# Patient Record
Sex: Female | Born: 1960
Health system: Southern US, Community
[De-identification: ages and names within clinical notes are randomized; demographics above are authoritative.]

## PROBLEM LIST (undated history)

## (undated) DIAGNOSIS — G5600 Carpal tunnel syndrome, unspecified upper limb: Secondary | ICD-10-CM

## (undated) DIAGNOSIS — E669 Obesity, unspecified: Secondary | ICD-10-CM

## (undated) DIAGNOSIS — M1990 Unspecified osteoarthritis, unspecified site: Secondary | ICD-10-CM

## (undated) DIAGNOSIS — K219 Gastro-esophageal reflux disease without esophagitis: Secondary | ICD-10-CM

## (undated) DIAGNOSIS — T7840XA Allergy, unspecified, initial encounter: Secondary | ICD-10-CM

## (undated) DIAGNOSIS — I10 Essential (primary) hypertension: Secondary | ICD-10-CM

## (undated) DIAGNOSIS — E559 Vitamin D deficiency, unspecified: Secondary | ICD-10-CM

## (undated) DIAGNOSIS — E538 Deficiency of other specified B group vitamins: Secondary | ICD-10-CM

## (undated) DIAGNOSIS — R609 Edema, unspecified: Secondary | ICD-10-CM

## (undated) DIAGNOSIS — J329 Chronic sinusitis, unspecified: Secondary | ICD-10-CM

## (undated) DIAGNOSIS — D649 Anemia, unspecified: Secondary | ICD-10-CM

## (undated) HISTORY — PX: CARPAL TUNNEL RELEASE: SHX101

## (undated) HISTORY — DX: Allergy, unspecified, initial encounter: T78.40XA

## (undated) HISTORY — DX: Deficiency of other specified B group vitamins: E53.8

## (undated) HISTORY — DX: Essential (primary) hypertension: I10

## (undated) HISTORY — PX: HERNIA REPAIR: SHX51

## (undated) HISTORY — DX: Chronic sinusitis, unspecified: J32.9

## (undated) HISTORY — PX: NASAL SINUS SURGERY: SHX719

## (undated) HISTORY — DX: Gastro-esophageal reflux disease without esophagitis: K21.9

## (undated) HISTORY — DX: Anemia, unspecified: D64.9

## (undated) HISTORY — DX: Vitamin D deficiency, unspecified: E55.9

## (undated) HISTORY — DX: Obesity, unspecified: E66.9

## (undated) HISTORY — DX: Carpal tunnel syndrome, unspecified upper limb: G56.00

## (undated) HISTORY — PX: JOINT REPLACEMENT: SHX530

## (undated) HISTORY — DX: Edema, unspecified: R60.9

## (undated) HISTORY — PX: COLONOSCOPY: SHX174

## (undated) HISTORY — PX: HEMORRHOID SURGERY: SHX153

---

## 1999-01-30 ENCOUNTER — Emergency Department (HOSPITAL_COMMUNITY): Admission: EM | Admit: 1999-01-30 | Discharge: 1999-01-30 | Payer: Self-pay | Admitting: Emergency Medicine

## 2000-05-12 ENCOUNTER — Other Ambulatory Visit: Admission: RE | Admit: 2000-05-12 | Discharge: 2000-05-12 | Payer: Self-pay | Admitting: Internal Medicine

## 2001-04-14 ENCOUNTER — Encounter (INDEPENDENT_AMBULATORY_CARE_PROVIDER_SITE_OTHER): Payer: Self-pay | Admitting: *Deleted

## 2001-04-14 ENCOUNTER — Ambulatory Visit (HOSPITAL_COMMUNITY): Admission: RE | Admit: 2001-04-14 | Discharge: 2001-04-14 | Payer: Self-pay | Admitting: General Surgery

## 2001-05-10 ENCOUNTER — Ambulatory Visit (HOSPITAL_COMMUNITY): Admission: RE | Admit: 2001-05-10 | Discharge: 2001-05-10 | Payer: Self-pay | Admitting: General Surgery

## 2001-06-08 ENCOUNTER — Other Ambulatory Visit: Admission: RE | Admit: 2001-06-08 | Discharge: 2001-06-08 | Payer: Self-pay | Admitting: Internal Medicine

## 2002-10-02 ENCOUNTER — Other Ambulatory Visit: Admission: RE | Admit: 2002-10-02 | Discharge: 2002-10-02 | Payer: Self-pay | Admitting: Internal Medicine

## 2003-05-23 ENCOUNTER — Other Ambulatory Visit: Admission: RE | Admit: 2003-05-23 | Discharge: 2003-05-23 | Payer: Self-pay | Admitting: Obstetrics and Gynecology

## 2004-01-18 ENCOUNTER — Other Ambulatory Visit: Admission: RE | Admit: 2004-01-18 | Discharge: 2004-01-18 | Payer: Self-pay | Admitting: Obstetrics and Gynecology

## 2004-07-21 ENCOUNTER — Other Ambulatory Visit: Admission: RE | Admit: 2004-07-21 | Discharge: 2004-07-21 | Payer: Self-pay | Admitting: Obstetrics and Gynecology

## 2004-08-27 ENCOUNTER — Ambulatory Visit: Payer: Self-pay | Admitting: Internal Medicine

## 2004-12-29 ENCOUNTER — Ambulatory Visit: Payer: Self-pay | Admitting: Internal Medicine

## 2005-01-14 ENCOUNTER — Other Ambulatory Visit: Admission: RE | Admit: 2005-01-14 | Discharge: 2005-01-14 | Payer: Self-pay | Admitting: Obstetrics and Gynecology

## 2005-03-20 ENCOUNTER — Ambulatory Visit: Payer: Self-pay | Admitting: Family Medicine

## 2005-06-23 ENCOUNTER — Ambulatory Visit: Payer: Self-pay | Admitting: Internal Medicine

## 2005-07-15 ENCOUNTER — Ambulatory Visit: Payer: Self-pay | Admitting: Family Medicine

## 2005-07-15 ENCOUNTER — Emergency Department (HOSPITAL_COMMUNITY): Admission: EM | Admit: 2005-07-15 | Discharge: 2005-07-15 | Payer: Self-pay | Admitting: Emergency Medicine

## 2005-07-16 ENCOUNTER — Ambulatory Visit: Payer: Self-pay | Admitting: Internal Medicine

## 2005-07-16 ENCOUNTER — Ambulatory Visit (HOSPITAL_COMMUNITY): Admission: EM | Admit: 2005-07-16 | Discharge: 2005-07-17 | Payer: Self-pay | Admitting: Emergency Medicine

## 2005-08-04 ENCOUNTER — Ambulatory Visit: Payer: Self-pay | Admitting: Internal Medicine

## 2006-05-12 ENCOUNTER — Ambulatory Visit: Payer: Self-pay | Admitting: Internal Medicine

## 2006-06-11 ENCOUNTER — Ambulatory Visit: Payer: Self-pay | Admitting: Internal Medicine

## 2007-02-10 ENCOUNTER — Encounter: Payer: Self-pay | Admitting: Internal Medicine

## 2007-02-10 DIAGNOSIS — E6609 Other obesity due to excess calories: Secondary | ICD-10-CM | POA: Insufficient documentation

## 2007-02-10 DIAGNOSIS — I10 Essential (primary) hypertension: Secondary | ICD-10-CM | POA: Insufficient documentation

## 2007-02-10 DIAGNOSIS — Z6838 Body mass index (BMI) 38.0-38.9, adult: Secondary | ICD-10-CM

## 2007-02-10 DIAGNOSIS — E669 Obesity, unspecified: Secondary | ICD-10-CM

## 2007-02-10 HISTORY — DX: Essential (primary) hypertension: I10

## 2007-02-10 HISTORY — DX: Obesity, unspecified: E66.9

## 2007-05-17 ENCOUNTER — Encounter (INDEPENDENT_AMBULATORY_CARE_PROVIDER_SITE_OTHER): Payer: Self-pay

## 2007-06-10 ENCOUNTER — Encounter: Payer: Self-pay | Admitting: Internal Medicine

## 2007-06-23 ENCOUNTER — Telehealth: Payer: Self-pay | Admitting: Internal Medicine

## 2007-09-08 ENCOUNTER — Telehealth: Payer: Self-pay | Admitting: Internal Medicine

## 2007-09-14 ENCOUNTER — Ambulatory Visit: Payer: Self-pay | Admitting: Internal Medicine

## 2007-09-14 DIAGNOSIS — M25562 Pain in left knee: Secondary | ICD-10-CM

## 2007-09-14 DIAGNOSIS — G8929 Other chronic pain: Secondary | ICD-10-CM | POA: Insufficient documentation

## 2007-10-07 ENCOUNTER — Telehealth: Payer: Self-pay | Admitting: Internal Medicine

## 2007-10-11 ENCOUNTER — Ambulatory Visit: Payer: Self-pay | Admitting: Internal Medicine

## 2007-10-11 DIAGNOSIS — J019 Acute sinusitis, unspecified: Secondary | ICD-10-CM | POA: Insufficient documentation

## 2007-12-05 ENCOUNTER — Telehealth: Payer: Self-pay | Admitting: Internal Medicine

## 2008-03-06 ENCOUNTER — Ambulatory Visit: Payer: Self-pay | Admitting: Internal Medicine

## 2008-03-06 DIAGNOSIS — K219 Gastro-esophageal reflux disease without esophagitis: Secondary | ICD-10-CM

## 2008-03-06 HISTORY — DX: Gastro-esophageal reflux disease without esophagitis: K21.9

## 2008-03-06 LAB — CONVERTED CEMR LAB
ALT: 17 units/L (ref 0–35)
AST: 18 units/L (ref 0–37)
Albumin: 3.6 g/dL (ref 3.5–5.2)
Alkaline Phosphatase: 84 units/L (ref 39–117)
BUN: 11 mg/dL (ref 6–23)
Basophils Absolute: 0.1 10*3/uL (ref 0.0–0.1)
Basophils Relative: 1.8 % — ABNORMAL HIGH (ref 0.0–1.0)
Bilirubin, Direct: 0.1 mg/dL (ref 0.0–0.3)
CO2: 29 meq/L (ref 19–32)
Calcium: 8.9 mg/dL (ref 8.4–10.5)
Chloride: 103 meq/L (ref 96–112)
Cholesterol: 152 mg/dL (ref 0–200)
Creatinine, Ser: 0.7 mg/dL (ref 0.4–1.2)
Eosinophils Absolute: 0.1 10*3/uL (ref 0.0–0.7)
Eosinophils Relative: 2.1 % (ref 0.0–5.0)
GFR calc Af Amer: 115 mL/min
GFR calc non Af Amer: 95 mL/min
Glucose, Bld: 66 mg/dL — ABNORMAL LOW (ref 70–99)
HCT: 37.1 % (ref 36.0–46.0)
Hemoglobin: 12.3 g/dL (ref 12.0–15.0)
Lymphocytes Relative: 34.1 % (ref 12.0–46.0)
MCHC: 33.2 g/dL (ref 30.0–36.0)
MCV: 93.8 fL (ref 78.0–100.0)
Monocytes Absolute: 0.6 10*3/uL (ref 0.1–1.0)
Monocytes Relative: 8.5 % (ref 3.0–12.0)
Neutro Abs: 3.5 10*3/uL (ref 1.4–7.7)
Neutrophils Relative %: 53.5 % (ref 43.0–77.0)
Platelets: 392 10*3/uL (ref 150–400)
Potassium: 4.4 meq/L (ref 3.5–5.1)
RBC: 3.95 M/uL (ref 3.87–5.11)
RDW: 13 % (ref 11.5–14.6)
Sodium: 140 meq/L (ref 135–145)
TSH: 0.73 microintl units/mL (ref 0.35–5.50)
Total Bilirubin: 0.6 mg/dL (ref 0.3–1.2)
Total Protein: 6.9 g/dL (ref 6.0–8.3)
WBC: 6.5 10*3/uL (ref 4.5–10.5)

## 2008-04-12 ENCOUNTER — Telehealth (INDEPENDENT_AMBULATORY_CARE_PROVIDER_SITE_OTHER): Payer: Self-pay | Admitting: *Deleted

## 2008-05-09 ENCOUNTER — Telehealth: Payer: Self-pay | Admitting: *Deleted

## 2008-06-05 ENCOUNTER — Encounter: Payer: Self-pay | Admitting: Internal Medicine

## 2008-06-11 ENCOUNTER — Encounter: Admission: RE | Admit: 2008-06-11 | Discharge: 2008-06-11 | Payer: Self-pay | Admitting: General Surgery

## 2008-06-13 ENCOUNTER — Ambulatory Visit (HOSPITAL_BASED_OUTPATIENT_CLINIC_OR_DEPARTMENT_OTHER): Admission: RE | Admit: 2008-06-13 | Discharge: 2008-06-14 | Payer: Self-pay | Admitting: General Surgery

## 2008-06-13 ENCOUNTER — Encounter (HOSPITAL_BASED_OUTPATIENT_CLINIC_OR_DEPARTMENT_OTHER): Payer: Self-pay | Admitting: General Surgery

## 2008-10-16 ENCOUNTER — Ambulatory Visit: Payer: Self-pay | Admitting: Internal Medicine

## 2008-10-16 DIAGNOSIS — J069 Acute upper respiratory infection, unspecified: Secondary | ICD-10-CM | POA: Insufficient documentation

## 2009-01-02 ENCOUNTER — Telehealth: Payer: Self-pay | Admitting: Family Medicine

## 2009-01-09 ENCOUNTER — Encounter (INDEPENDENT_AMBULATORY_CARE_PROVIDER_SITE_OTHER): Payer: Self-pay | Admitting: *Deleted

## 2009-01-09 ENCOUNTER — Ambulatory Visit: Payer: Self-pay | Admitting: Internal Medicine

## 2009-01-09 DIAGNOSIS — M542 Cervicalgia: Secondary | ICD-10-CM | POA: Insufficient documentation

## 2009-01-11 ENCOUNTER — Telehealth: Payer: Self-pay | Admitting: Internal Medicine

## 2009-05-02 ENCOUNTER — Ambulatory Visit: Payer: Self-pay | Admitting: Internal Medicine

## 2009-06-14 ENCOUNTER — Telehealth: Payer: Self-pay | Admitting: Internal Medicine

## 2010-04-04 ENCOUNTER — Ambulatory Visit: Payer: Self-pay | Admitting: Family Medicine

## 2010-04-04 DIAGNOSIS — J309 Allergic rhinitis, unspecified: Secondary | ICD-10-CM | POA: Insufficient documentation

## 2010-08-04 ENCOUNTER — Ambulatory Visit: Payer: Self-pay | Admitting: Internal Medicine

## 2010-08-04 DIAGNOSIS — G5600 Carpal tunnel syndrome, unspecified upper limb: Secondary | ICD-10-CM | POA: Insufficient documentation

## 2010-08-04 HISTORY — DX: Carpal tunnel syndrome, unspecified upper limb: G56.00

## 2010-08-11 ENCOUNTER — Telehealth: Payer: Self-pay | Admitting: Internal Medicine

## 2010-10-28 NOTE — Progress Notes (Signed)
Summary: Cough Medicine Needed  Phone Note Call from Patient Call back at [phone]   Caller: Patient Reason for Call: Acute Illness Summary of Call: Patient called an LM on triage VM stating that she has a sinus infection and a horrible cough. She  says that she is taking delsym and it is not working. She would like something stronger called in. Please advise.  Ph[facility]: [facility] [address]  Initial call taken by: Harold Barban,  August 11, 2010 1:23 PM  Follow-up for Phone Call        generic Hydromet, 6 ounces, 1 teaspoon every 6 hours as needed for cough refill one Follow-up by: Gordy Savers  MD,  August 11, 2010 5:19 PM  Additional Follow-up for Phone Call Additional follow up Details #1::        med list changed - [facility] - pt aware. KIK Additional Follow-up by: Duard Brady LPN,  August 13, 2010 8:46 AM    New/Updated Medications: HYDROMET 5-1.5 MG/5ML SYRP (HYDROCODONE-HOMATROPINE) 1 tsp q6hrs as needed cough Prescriptions: HYDROMET 5-1.5 MG/5ML SYRP (HYDROCODONE-HOMATROPINE) 1 tsp q6hrs as needed cough  #6oz x 0   Entered by:   Duard Brady LPN   Authorized by:   Gordy Savers  MD   Signed by:   Duard Brady LPN on 16/06/9603   Method used:   Historical   RxID:   [id number]

## 2010-10-28 NOTE — Assessment & Plan Note (Signed)
Summary: BILATERAL HAND NUMBNESS/TINGLING // RS   Vital Signs:  Patient profile:   50 year old female Weight:      246 pounds Temp:     98.4 degrees F oral BP sitting:   110 / 70  (right arm) Cuff size:   regular  Vitals Entered By: Duard Brady LPN (August 04, 2010 4:32 PM) CC: c/o numbness to both hands on/off x3 mos , (R) wrist pain Is Patient Diabetic? No   CC:  c/o numbness to both hands on/off x3 mos  and (R) wrist pain.  History of Present Illness: 50 year old patient who has a long history of hand and arm numbness.  For the past several weeks.  She has had more difficulty throughout the day.  In the past as a problem, just when she awoke in the morning, but then quickly improved.  Denies any motor weakness; she also has had some pain involving the right wrist area.  She has treated hypertension, which has been under good control  Allergies: 1)  Sulfamethoxazole (Sulfamethoxazole)  Past History:  Past Medical History: Reviewed history from 10/16/2008 and no changes required. Hypertension GERD obesity  Review of Systems       The patient complains of weight gain.  The patient denies anorexia, fever, weight loss, vision loss, decreased hearing, hoarseness, chest pain, syncope, dyspnea on exertion, peripheral edema, prolonged cough, headaches, hemoptysis, abdominal pain, melena, hematochezia, severe indigestion/heartburn, hematuria, incontinence, genital sores, muscle weakness, suspicious skin lesions, transient blindness, difficulty walking, depression, unusual weight change, abnormal bleeding, enlarged lymph nodes, angioedema, and breast masses.    Physical Exam  General:  overweight-appearing.  normal blood pressure Msk:  positive Tinel's.  No motor weakness   Impression & Recommendations:  Problem # 1:  CARPAL TUNNEL SYNDROME, BILATERAL (ICD-354.0)  Problem # 2:  HYPERTENSION (ICD-401.9)  Her updated medication list for this problem includes:  Benazepril-hydrochlorothiazide 20-25 Mg Tabs (Benazepril-hydrochlorothiazide) .Marland Kitchen... Take 1 tablet by mouth once a day  Complete Medication List: 1)  Benazepril-hydrochlorothiazide 20-25 Mg Tabs (Benazepril-hydrochlorothiazide) .... Take 1 tablet by mouth once a day 2)  Diethylpropion Hcl Cr 75 Mg Tb24 (Diethylpropion hcl) .Marland Kitchen.. 1 once daily 3)  Naproxen 500 Mg Tabs (Naproxen) .... One twice daily 4)  Zyrtec Hives Relief 10 Mg Tabs (Cetirizine hcl) .... One a day 5)  Mucinex Dm 30-600 Mg Xr12h-tab (Dextromethorphan-guaifenesin) .... Once a day  Patient Instructions: 1)  Please schedule a follow-up appointment in 6 months. 2)  Limit your Sodium (Salt). 3)  It is important that you exercise regularly at least 20 minutes 5 times a week. If you develop chest pain, have severe difficulty breathing, or feel very tired , stop exercising immediately and seek medical attention. 4)  You need to lose weight. Consider a lower calorie diet and regular exercise.  5)  Check your Blood Pressure regularly. If it is above:  150/90 you should make an appointment. Prescriptions: NAPROXEN 500 MG TABS (NAPROXEN) one twice daily  #180 x 4   Entered and Authorized by:   Gordy Savers  MD   Signed by:   Gordy Savers  MD on 08/04/2010   Method used:   Print then Give to Patient   RxID:   [id number] DIETHYLPROPION HCL CR 75 MG  TB24 (DIETHYLPROPION HCL) 1 once daily  #90 x 4   Entered and Authorized by:   Gordy Savers  MD   Signed by:   Gordy Savers  MD on 08/04/2010  Method used:   Print then Give to Patient   RxID:   [id number] BENAZEPRIL-HYDROCHLOROTHIAZIDE 20-25 MG TABS (BENAZEPRIL-HYDROCHLOROTHIAZIDE) Take 1 tablet by mouth once a day  #90 Tablet x 4   Entered and Authorized by:   Gordy Savers  MD   Signed by:   Gordy Savers  MD on 08/04/2010   Method used:   Print then Give to Patient   RxID:   [id number]   Orders Added: 1)  Est. Patient Level  III [95284]  Appended Document: BILATERAL HAND NUMBNESS/TINGLING // RS     Allergies: 1)  Sulfamethoxazole (Sulfamethoxazole)   Complete Medication List: 1)  Benazepril-hydrochlorothiazide 20-25 Mg Tabs (Benazepril-hydrochlorothiazide) .... Take 1 tablet by mouth once a day 2)  Diethylpropion Hcl Cr 75 Mg Tb24 (Diethylpropion hcl) .Marland Kitchen.. 1 once daily 3)  Naproxen 500 Mg Tabs (Naproxen) .... One twice daily 4)  Zyrtec Hives Relief 10 Mg Tabs (Cetirizine hcl) .... One a day 5)  Mucinex Dm 30-600 Mg Xr12h-tab (Dextromethorphan-guaifenesin) .... Once a day  Other Orders: Admin 1st Vaccine (13244) Flu Vaccine 67yrs + [id number])   Orders Added: 1)  Admin 1st Vaccine [90471] 2)  Flu Vaccine 58yrs + [25366] Flu Vaccine Consent Questions     Do you have a history of severe allergic reactions to this vaccine? no    Any prior history of allergic reactions to egg and/or gelatin? no    Do you have a sensitivity to the preservative Thimersol? no    Do you have a past history of Guillan-Barre Syndrome? no    Do you currently have an acute febrile illness? no    Have you ever had a severe reaction to latex? no    Vaccine information given and explained to patient? yes    Are you currently pregnant? no    Lot Number:[id number]   Exp Date:03/28/2011   Site Given  Left Deltoid IM 1)  Admin 1st Vaccine [90471] 2)  Flu Vaccine 53yrs + [44034]    .[email or web address]

## 2010-10-28 NOTE — Assessment & Plan Note (Signed)
Summary: sore throat/dm   Vital Signs:  Patient profile:   50 year old female Weight:      245 pounds Temp:     98.0 degrees F oral BP sitting:   126 / 90  (right arm) Cuff size:   regular  Vitals Entered By: Kathrynn Speed CMA (April 04, 2010 10:03 AM) CC: sore throat x 3 days, nasal drainage Cyndia Skeeters   CC:  sore throat x 3 days and nasal drainage /src.  History of Present Illness: Hailey [name] is a 50 year old female, nonsmoker, who comes in with a 3-day history of a sore throat, head congestion, soreness in her left ear and postnasal drip.  She, states she's been under a lot of stress recently.  Her father just died at the [place].  she thinks she has a sinus infection and is requesting an antibiotic.  No fever, facial pain, etc., etc., etc., and she is taking Zyrtec-D, however, she has underlying hypertension.  BP today 126/90.  I recommended because of her hypertension that she take the plain Zyrtec, not to be she states the Zyrtec is not helping at all and wants another medication  Current Medications (verified): 1)  Benazepril-Hydrochlorothiazide 20-25 Mg Tabs (Benazepril-Hydrochlorothiazide) .... Take 1 Tablet By Mouth Once A Day 2)  Diethylpropion Hcl Cr 75 Mg  Tb24 (Diethylpropion Hcl) .Marland Kitchen.. 1 Once Daily 3)  Zegerid 40-1100 Mg  Caps (Omeprazole-Sodium Bicarbonate) .... One Daily 4)  Naproxen 500 Mg Tabs (Naproxen) .... One Twice Daily 5)  Zyrtec Hives Relief 10 Mg Tabs (Cetirizine Hcl) .... One A Day 6)  Mucinex Dm 30-600 Mg Xr12h-Tab (Dextromethorphan-Guaifenesin) .... Once A Day  Allergies (verified): 1)  Sulfamethoxazole (Sulfamethoxazole)  Past History:  Past medical, surgical, family and social histories (including risk factors) reviewed for relevance to current acute and chronic problems.  Past Medical History: Reviewed history from 10/16/2008 and no changes required. Hypertension GERD obesity  Past Surgical History: Reviewed history from 02/10/2007 and no changes  required. Hemorrhoidectomy Sinus surgery  Family History: Reviewed history from 09/14/2007 and no changes required. mother recently died this past fall due to autoimmune hepatitis and liver failure  Social History: Reviewed history and no changes required.  Review of Systems      See HPI  Physical Exam  General:  Well-developed,well-nourished,in no acute distress; alert,appropriate and cooperative throughout examination Head:  Normocephalic and atraumatic without obvious abnormalities. No apparent alopecia or balding. Eyes:  No corneal or conjunctival inflammation noted. EOMI. Perrla. Funduscopic exam benign, without hemorrhages, exudates or papilledema. Vision grossly normal. Ears:  External ear exam shows no significant lesions or deformities.  Otoscopic examination reveals clear canals, tympanic membranes are intact bilaterally without bulging, retraction, inflammation or discharge. Hearing is grossly normal bilaterally. Nose:  External nasal examination shows no deformity or inflammation. Nasal mucosa are pink and moist without lesions or exudates. Mouth:  oral cavity normal.  A lot of postnasal drip.  She also requests I look where she had a mole removed on the left lower jaw.  That area also appears normal.  However, I explained to her, that I'm not the dentist, and she is having trouble that she needs to go back and see the oral surgeon Neck:  No deformities, masses, or tenderness noted. Chest Wall:  No deformities, masses, or tenderness noted. Lungs:  Normal respiratory effort, chest expands symmetrically. Lungs are clear to auscultation, no crackles or wheezes.   Problems:  Medical Problems Added: 1)  Dx of Rhinitis  (ICD-477.9)  Impression &  Recommendations:  Problem # 1:  RHINITIS (ICD-477.9) Assessment Deteriorated  Her updated medication list for this problem includes:    Zyrtec Hives Relief 10 Mg Tabs (Cetirizine hcl) ..... One a day  Complete Medication  List: 1)  Benazepril-hydrochlorothiazide 20-25 Mg Tabs (Benazepril-hydrochlorothiazide) .... Take 1 tablet by mouth once a day 2)  Diethylpropion Hcl Cr 75 Mg Tb24 (Diethylpropion hcl) .Marland Kitchen.. 1 once daily 3)  Zegerid 40-1100 Mg Caps (Omeprazole-sodium bicarbonate) .... One daily 4)  Naproxen 500 Mg Tabs (Naproxen) .... One twice daily 5)  Zyrtec Hives Relief 10 Mg Tabs (Cetirizine hcl) .... One a day 6)  Mucinex Dm 30-600 Mg Xr12h-tab (Dextromethorphan-guaifenesin) .... Once a day  Patient Instructions: 1)  I recommend that you take plain Claritin in the morning and in the future.  Did not take anything with pseudoephedrine in it because of the underlying hypertension.  Return p.r.n.Marland Kitchen 2)  I explained that this is a allergic response.  No evidence of infection.  Therefore, antibiotics are not indicated

## 2010-12-22 ENCOUNTER — Ambulatory Visit (INDEPENDENT_AMBULATORY_CARE_PROVIDER_SITE_OTHER): Payer: PRIVATE HEALTH INSURANCE | Admitting: Internal Medicine

## 2010-12-22 ENCOUNTER — Encounter: Payer: Self-pay | Admitting: Internal Medicine

## 2010-12-22 VITALS — BP 118/70 | Temp 98.5°F | Ht 65.0 in | Wt 250.0 lb

## 2010-12-22 DIAGNOSIS — M25519 Pain in unspecified shoulder: Secondary | ICD-10-CM

## 2010-12-22 DIAGNOSIS — M25512 Pain in left shoulder: Secondary | ICD-10-CM

## 2010-12-22 DIAGNOSIS — I10 Essential (primary) hypertension: Secondary | ICD-10-CM

## 2010-12-22 MED ORDER — ZOLPIDEM TARTRATE 10 MG PO TABS: 10.0000 mg | ORAL_TABLET | Freq: Every evening | ORAL | Status: AC | PRN

## 2010-12-22 NOTE — Progress Notes (Signed)
  Subjective:    Patient ID: Hailey [name], female    DOB: 10-30-1960, 50 y.o.   MRN: [id number]  HPI   50 year old left-handed female who presents with left shoulder pain as well as left anterior chest wall pain. She is quite active physically and was doing considerable overhead work stocking food products. She's had pain in left shoulder area with movement as well as tenderness in the anterior mid chest region. She is sleeping poorly due to the poor health of her husband denies any exertional chest pain she does have treated hypertension which has been stable. She does have exogenous obesity she is on naproxen 500 mg once daily    Review of Systems  Constitutional: Negative.   HENT: Negative for hearing loss, congestion, sore throat, rhinorrhea, dental problem, sinus pressure and tinnitus.   Eyes: Negative for pain, discharge and visual disturbance.  Respiratory: Negative for cough and shortness of breath.   Cardiovascular: Positive for chest pain. Negative for palpitations and leg swelling.  Gastrointestinal: Negative for nausea, vomiting, abdominal pain, diarrhea, constipation, blood in stool and abdominal distention.  Genitourinary: Negative for dysuria, urgency, frequency, hematuria, flank pain, vaginal bleeding, vaginal discharge, difficulty urinating, vaginal pain and pelvic pain.  Musculoskeletal: Negative for joint swelling, arthralgias and gait problem.       [ Left shoulder pain with movement Skin: Negative for rash.  Neurological: Negative for dizziness, syncope, speech difficulty, weakness, numbness and headaches.  Hematological: Negative for adenopathy.  Psychiatric/Behavioral: Negative for behavioral problems, dysphoric mood and agitation. The patient is not nervous/anxious.        Objective:   Physical Exam  Constitutional: She is oriented to person, place, and time. She appears well-developed and well-nourished.  HENT:  Head: Normocephalic.  Right Ear: External ear  normal.  Left Ear: External ear normal.  Mouth/Throat: Oropharynx is clear and moist.  Eyes: Conjunctivae and EOM are normal. Pupils are equal, round, and reactive to light.  Neck: Normal range of motion. Neck supple. No thyromegaly present.  Cardiovascular: Normal rate, regular rhythm, normal heart sounds and intact distal pulses.   Pulmonary/Chest: Effort normal and breath sounds normal.        Tenderness was noted to gentle palpation over the mid anterior chest wall  Abdominal: Soft. Bowel sounds are normal. She exhibits no mass. There is no tenderness.  Musculoskeletal: Normal range of motion.        Range of motion left shoulder intact she did have tenderness with movement however  Lymphadenopathy:    She has no cervical adenopathy.  Neurological: She is alert and oriented to person, place, and time.  Skin: Skin is warm and dry. No rash noted.  Psychiatric: She has a normal mood and affect. Her behavior is normal.          Assessment & Plan:   anterior chest wall pain. Left shoulder discomfort. Musculoskeletal. Will increase naproxen to a twice a day regimen and observe  Hypertension stable.   We'll recheck in 6 months or when necessary

## 2010-12-22 NOTE — Patient Instructions (Signed)
Increase naproxen to twice daily  Limit your sodium (Salt) intake  Please check your blood pressure on a regular basis.  If it is consistently greater than 150/90, please make an office appointment.  Return in 6 months for follow-up  Call or return to clinic prn if these symptoms worsen or fail to improve as anticipated.

## 2011-02-09 ENCOUNTER — Ambulatory Visit: Payer: PRIVATE HEALTH INSURANCE | Admitting: Internal Medicine

## 2011-02-09 ENCOUNTER — Telehealth: Payer: Self-pay | Admitting: Internal Medicine

## 2011-02-09 ENCOUNTER — Encounter: Payer: Self-pay | Admitting: Internal Medicine

## 2011-02-09 ENCOUNTER — Ambulatory Visit (INDEPENDENT_AMBULATORY_CARE_PROVIDER_SITE_OTHER): Payer: PRIVATE HEALTH INSURANCE | Admitting: Internal Medicine

## 2011-02-09 DIAGNOSIS — I10 Essential (primary) hypertension: Secondary | ICD-10-CM

## 2011-02-09 DIAGNOSIS — G5600 Carpal tunnel syndrome, unspecified upper limb: Secondary | ICD-10-CM

## 2011-02-09 DIAGNOSIS — R200 Anesthesia of skin: Secondary | ICD-10-CM

## 2011-02-09 MED ORDER — FUROSEMIDE 20 MG PO TABS: 20.0000 mg | ORAL_TABLET | Freq: Every day | ORAL | Status: DC

## 2011-02-09 NOTE — Patient Instructions (Signed)
Limit your sodium (Salt) intake  Please check your blood pressure on a regular basis.  If it is consistently greater than 150/90, please make an office appointment.  Nerve conduction  studies as discussed  Return in 4 months for follow-up

## 2011-02-09 NOTE — Progress Notes (Signed)
  Subjective:    Patient ID: Hailey [name], female    DOB: 08-02-1961, 50 y.o.   MRN: [id number]  HPI  50 year old patient who has a history of suspected bilateral carpal tunnel syndrome. She has been symptomatic for about 6 months she has been using nocturnal wrist splints anti-inflammatory medications. She is right-handed but does use both hands with repetitious type activities frequently throughout the day she describes increasing numbness involving the tips of the right thumb and index fingers. She describes tingling involving both hands there's been no motor weakness .   she has hypertension which has been well-controlled on ACE inhibition and diuretic therapy she complains of increasing pedal edema. She does request a fluid filled take as needed    Review of Systems  Constitutional: Negative.   HENT: Negative for hearing loss, congestion, sore throat, rhinorrhea, dental problem, sinus pressure and tinnitus.   Eyes: Negative for pain, discharge and visual disturbance.  Respiratory: Negative for cough and shortness of breath.   Cardiovascular: Positive for leg swelling. Negative for chest pain and palpitations.  Gastrointestinal: Negative for nausea, vomiting, abdominal pain, diarrhea, constipation, blood in stool and abdominal distention.  Genitourinary: Negative for dysuria, urgency, frequency, hematuria, flank pain, vaginal bleeding, vaginal discharge, difficulty urinating, vaginal pain and pelvic pain.  Musculoskeletal: Negative for joint swelling, arthralgias and gait problem.  Skin: Negative for rash.  Neurological: Positive for numbness. Negative for dizziness, syncope, speech difficulty, weakness and headaches.  Hematological: Negative for adenopathy.  Psychiatric/Behavioral: Negative for behavioral problems, dysphoric mood and agitation. The patient is not nervous/anxious.        Objective:   Physical Exam  Constitutional: She appears well-developed and well-nourished. No  distress.       Overweight no distress blood pressure 140/80  Musculoskeletal: She exhibits edema.       +2 bilateral ankle edema  Neurological:       Tinel's negative. No atrophy or motor weakness          Assessment & Plan:     Carpal tunnel syndrome. We'll confirm with nerve conduction studies and also to help assess severity we'll consider referral to a hand surgeon Hypertension stable Pedal edema. We'll add when necessary furosemide salt restriction encouraged

## 2011-02-09 NOTE — Telephone Encounter (Signed)
Pt said to be sure to call her on her cellphone re: getting referral for nerve conduction study.

## 2011-02-10 ENCOUNTER — Telehealth: Payer: Self-pay

## 2011-02-10 NOTE — Telephone Encounter (Signed)
Please schedule nerve conduction studies of the arms. Rule out bilateral carpal tunnel syndrome

## 2011-02-10 NOTE — Telephone Encounter (Signed)
I do not have a referral for this pt to have a NCS.

## 2011-02-10 NOTE — Telephone Encounter (Signed)
We saw her yesterday - is there a referral that should be ordered? Please advise

## 2011-02-10 NOTE — Telephone Encounter (Signed)
Order place and sent to terri 

## 2011-02-10 NOTE — Telephone Encounter (Signed)
Went to work today - right hand and lower arm numbness worse. Has to really concentrate and tell arm what to do. Is there anything that can be done until she can be seen by neuro?    Called pt to let her know it will be tomorrow AM before dr. Frederica Kuster will see message and will address - she understands - also wanted to know if there was anything else other than naproxen that she could take. KIK

## 2011-02-10 NOTE — Op Note (Signed)
NAMETRINDA, HARLACHER NO.:  [id number]   MEDICAL RECORD NO.:  [id number]          PATIENT TYPE:  AMB   LOCATION:  DSC                          FACILITY:  [facility]   PHYSICIAN:  Leonie Man, M.D.   DATE OF BIRTH:  Feb 13, 1961   DATE OF PROCEDURE:  06/13/2008  DATE OF DISCHARGE:                               OPERATIVE REPORT   PREOPERATIVE DIAGNOSIS:  Recurrent ventral hernia.   POSTOPERATIVE DIAGNOSIS:  Recurrent ventral hernia.   PROCEDURE:  Repair of recurrent ventral hernia with the mesh (Ventralex  to 8 cm).   SURGEON:  Leonie Man, MD   ASSISTANT:  OR nurse.   ANESTHESIA:  General.   SPECIMENS TO LAB:  Incarcerated omentum.   FINDINGS:  Incarcerated transverse colon and omentum   ESTIMATED BLOOD LOSS:  150 mL.   COMPLICATIONS:  None.   The patient returned to the PACU in excellent condition.   Ms. Cerritos is a 50 year old woman with a ventral hernia extending just  above the umbilicus and extending towards the right.  She had had this  repaired primarily in the past, but this has recurred, and she comes to  the operating room now for repair of this recurrent hernia.   PROCEDURE:  The patient was positioned supinely following the induction  of satisfactory general anesthesia using an LMA.  This was later  switched to an endotracheal tube so that the patient could receive  maximal relaxation.   The patient was identified as Hosie Spangle and procedure to be done  repair of recurrent ventral hernia.  All surgical precautions carried  out to the satisfaction of the operating team.   I made a midline epigastric incision from about the mid epigastrium down  to the umbilicus deepening through the skin and subcutaneous tissues and  then isolating a very large hernial sac.  Sac was dissected down to the  fascia.  The old Novafil sutures were removed, and the hernia sac was  opened.  Within the sac, there was a very tightly adhered portion of  transverse colon as well as a large portion of the omentum.  The omentum  was dissected free from the transverse colon, removed and forwarded for  pathologic evaluation.  The colon was then dissected free from its  adhesions to the defect to allow a clear area beneath the fascia.  The  resulting defect was approximately 4 cm in size.  I used an 8-cm  Ventralex mesh patch and inserted that into the abdomen and secured this  to the fascia with interrupted #1 Novafil sutures.  The fascia was then  closed over the mesh with #1 Novafil sutures.  Repair was intact.  The  subcutaneous tissues were thoroughly irrigated.  Sponge and instrument  counts were doubly verified.  Subcutaneous tissues were  closed with a  running 2-0 Vicryl. The skin was closed with a running 4-0 Monocryl, and  reinforced with Steri-Strips.  Sterile dressings were applied, the  anesthetic reversed, and the patient moved from the operating room to  the recovery room in stable condition.  She tolerated the procedure  well.      Leonie Man, M.D.  Electronically Signed     PB/MEDQ  D:  06/13/2008  T:  06/14/2008  Job:  [id number]

## 2011-02-11 MED ORDER — PREDNISONE (PAK) 5 MG PO TABS: 5.0000 mg | ORAL_TABLET | Freq: Every day | ORAL | Status: DC

## 2011-02-11 NOTE — Telephone Encounter (Signed)
Please schedule nerve conduction studies of the upper extremities as soon as possible. Call in a prednisone Dosepak 5 mg 6 day

## 2011-02-11 NOTE — Telephone Encounter (Signed)
Spoke with pt - informed of med being sent to pharmacy. KIK

## 2011-02-11 NOTE — Telephone Encounter (Signed)
SEE NOTE

## 2011-02-13 NOTE — H&P (Signed)
[name], Hailey [name] NO.:  [id number]   MEDICAL RECORD NO.:  [id number]          PATIENT TYPE:  EMS   LOCATION:  ED                           FACILITY:  [facility]   PHYSICIAN:  Leonie Man, M.D.   DATE OF BIRTH:  1960/12/29   DATE OF ADMISSION:  07/16/2005  DATE OF DISCHARGE:                                HISTORY & PHYSICAL   PROBLEM:  Incarcerated ventral hernia.   HISTORY:  The patient is a 50 year old female with a known ventral hernia  over the past six months with intermittent episodes of incarceration.  She  presented 24 hours ago to the emergency room, where she was evaluated and  then discharged after her hernia was reduced.  She presented again today  with increasing pain and an abdominal mass.  The hernia could not then be  reduced.  She is being admitted now for further evaluation and repair.   PAST MEDICAL HISTORY:  The patient has had drainage of perianal abscess,  proctoscopy and repair of anal fissure, rubber band ligation of internal  hemorrhoids, and endoscopic sinus surgery.   MEDICATIONS:  Allegra-D, Vicodin, Ortho 777, hydrochlorothiazide, and  Meridia.   ALLERGIES:  SULFA.   SOCIAL HISTORY:  Married African-American female.  Employed.  No tobacco  use.  No history of illicit drug use.  Patient does take alcohol  occasionally.   FAMILY HISTORY:  Noncontributory.   REVIEW OF SYSTEMS:  Negative in detail except as outlined above.   PHYSICAL EXAMINATION:  VITAL SIGNS:  Temperature 99.9, blood pressure  133/85, pulse 103, respirations 18.  GENERAL:  Patient is a well-developed, modestly obese female.  HEENT:  Head is normocephalic.  Pupils are round, regular.  No scleral  icterus.  Oropharynx is benign. No nasal obstruction.  NECK:  Supple.  No thyromegaly or adenopathy.  CHEST:  Clear to auscultation bilaterally.  HEART:  Regular rate and rhythm.  No murmurs, rubs or gallops.  ABDOMEN:  Tender with a 5 cm mass in the epigastrium  approximately 3 cm  above the umbilicus.  No rebound tenderness.  Bowel sounds are normoactive.  EXTREMITIES:  Full range of motion.  No clubbing, cyanosis or edema.  Pulses  are equal bilaterally.  NEUROLOGIC:  The patient is alert and oriented without any gross motor or  sensory deficits.   ASSESSMENT:  Incarcerated, possibly strangulated ventral hernia.   The plan is for admission.  Routine labs.  The patient will be taken to the  operating room for emergency surgery.      Leonie Man, M.D.  Electronically Signed     PB/MEDQ  D:  07/16/2005  T:  07/16/2005  Job:  [id number]

## 2011-02-13 NOTE — Op Note (Signed)
[facility]  Patient:    Hailey [name], Hailey [name]                     MRN: [id number] Proc. Date: 04/14/01 Adm. Date:  [id number] Attending:  Carson Myrtle CC:         Gordy Savers, M.D.   Operative Report  PREOPERATIVE DIAGNOSIS:  Anal fissure, hemorrhoids.  POSTOPERATIVE DIAGNOSIS:  Anal fissure, internal and external hemorrhoids, squamous lesion of skin.  OPERATIVE PROCEDURE:  Proctoscopy, repair of anal fissure, band ligation of internal hemorrhoid, excision of skin lesion.  SURGEON:  Timothy E. Earlene Plater, M.D.  ANESTHESIA:  General.  INDICATIONS:  [name] has visited my office on a couple of occasions in the past two months and has failed conservative management of anal fissure. She is anxious to proceed with surgical repair, as has been carefully described.  She is known to have hemorrhoids, but her symptoms are those of fissure, and she only wants the fissure repaired.  We will try to avoid any cutting operation of the hemorrhoids as per her wish.  DESCRIPTION OF PROCEDURE:  The patient was taken to the operating room, LMA anesthesia provided, placed in lithotomy, perianal area inspected.  A significant abnormal squamous lesion of the right anterior skin of the anus was noted.  Internal and external hemorrhoids were prominent but no excessive; however, a right posterior internal hemorrhoid prolapsed easily.  An anal fissure was present, deep, angry in the posterior midline.  The anus was injected around and about with Marcaine 0.5% with epinephrine.  This was massaged in well.  The proctoscope was introduced and advanced to 25 cm. There were no mucosal lesions as I advanced distally.  The anal fissure was repaired by a percutaneous internal sphincterotomy in the left posterior position, accomplished with a 15 blade percutaneously.  There was no injury to the external sphincter.  The fissure itself was cauterized.  The  prolapsing right posterior hemorrhoid was band ligated.  The squamous lesion on the perianal skin of the right anterior area was excised, its base cauterized.  It was submitted in pathology.  She tolerated it well.  Counts correct.  Gelfoam, gauze, dry sterile dressing applied.  She was moved to the recovery room in good condition.  Written and verbal instructions were given her and her husband including Percocet 5 mg #36.  She will be seen and followed as an outpatient. DD:  04/14/01 TD:  04/14/01 Job: [id number]

## 2011-02-13 NOTE — Op Note (Signed)
[facility]  Patient:    Hailey [name], Hailey [name]                     MRN: [id number] Proc. Date: 05/10/01 Adm. Date:  [id number] Attending:  Carson Myrtle                           Operative Report  PREOPERATIVE DIAGNOSIS:  Postoperative perianal abscess.  POSTOPERATIVE DIAGNOSIS:  Postoperative perianal abscess.  PROCEDURE:  Drainage of perianal abscess.  SURGEON:  Timothy E. Earlene Plater, M.D.  ANESTHESIA:  General.  INDICATION:  Ms. Gombos presented with an unrelenting anal fissure in my office and underwent surgical repair on July 18.  She did have a slow recovery but by three weeks was feeling quite well, and exam in the office showed the fissure to be healed.  However, in the past 6-7 days, she has had increasing excruciating pain, inability to work, and some bright red bleeding.  Seen in the office yesterday.  She had perianal abscess in the area of the prior fissure posteriorly in the anoderm.  She was carefully advised and elected to proceed urgently at this time.  DESCRIPTION OF PROCEDURE:  The patient was brought to the operating room and placed supine.  General endotracheal anesthesia administered.  She was placed in lithotomy position, perianal area inspected, prepped and draped in the usual fashion.  Posteriorly, in the area of the prior fissure, there was a small abscess cavity, actually intersphincteric.  The area was anesthetized with 0.25% Marcaine with epinephrine, and the area was completely debrided. The internal sphincter debrided only slightly.  The external sphincter left completely alone.  The skin and subcutaneous tissue around the area debrided. The whole base then cauterized.  Gelfoam, gauze, and dry sterile dressing applied.  She tolerated it well and was moved to the recovery room in good condition.  Written and verbal instructions including Vicodin #40, ampicillin 500 #15 with 1 refill and other instructions, and she  will be seen and followed in the office. DD:  05/10/01 TD:  05/10/01 Job: [id number]

## 2011-02-13 NOTE — Op Note (Signed)
NAMECURSTIN, SCHMALE NO.:  [id number]   MEDICAL RECORD NO.:  [id number]          PATIENT TYPE:  INP   LOCATION:  0101                         FACILITY:  [facility]   PHYSICIAN:  Leonie Man, M.D.   DATE OF BIRTH:  1961-09-18   DATE OF PROCEDURE:  07/16/2005  DATE OF DISCHARGE:                                 OPERATIVE REPORT   PREOPERATIVE DIAGNOSIS:  Incarcerated ventral hernia.Marland Kitchen   POSTOPERATIVE DIAGNOSIS:  Incarcerated ventral hernia.   PROCEDURE:  Repair of ventral hernia.   SURGEON:  Leonie Man, M.D.   ASSISTANT:  OR technician.   ANESTHESIA:  General.   INDICATIONS:  The patient is a 50 year old woman with a known ventral hernia  over the past 6 months which had been intermittently incarcerating and had  been spontaneously reduced.  She presented to the emergency room  approximately 24 hours ago with incarcerated ventral hernia and was seen in  the ER.  This hernia was reduced and the patient instructed to come to our  office.  On presentation to our office today, she again has an incarcerated  ventral hernia which is not reducible.  She comes now through the emergency  room for admission and repair.   The risks and potential benefits of surgery been discussed with the patient  and with her spouse they understand and gives consent.   DESCRIPTION OF PROCEDURE:  Following induction of satisfactory general  anesthesia, the patient positioned supinely, and the abdomen is prepped and  draped to be included in a sterile operative field.  A midline incision was  carried down over the incarcerated mass and deepened through the skin,  subcutaneous tissues down to the incarcerated mass which included omentum  and incarcerated falciform ligament.  The hernia sac was opened, and the  incarcerated omentum and falciform ligament were divided between clamps and  secured with ties of silk.  Inspection of the abdominal wall at the region  of the hernia also  showed that below the hernia there was also a umbilical  defect.  The incision in the fascia was extended down through the umbilicus  so as to include the umbilical hernia within the repair.  The entire area  around the hernia was then freed of all adhesions and attachments, and the  hernia was repaired primarily with interrupted sutures of #1 Novofil placed  in an inverted mattress fashion.  The repair was noted to be intact.  Sponge  and instrument counts were verified. Subcutaneous tissues were irrigated and  closed with a running 2-0 Vicryl suture. The skin was closed with a running  4-0 Monocryl suture placed  subcuticularly.  The incision was then reinforced with Steri-Strips.  Sterile dressings were applied.  The anesthetic reversed and the patient  removed from the operating room to the recovery room in stable condition.  She tolerated the procedure well.      Leonie Man, M.D.  Electronically Signed     PB/MEDQ  D:  07/16/2005  T:  07/17/2005  Job:  [id number]

## 2011-02-25 ENCOUNTER — Telehealth: Payer: Self-pay | Admitting: *Deleted

## 2011-02-25 NOTE — Telephone Encounter (Signed)
Needs EMG results for carpal tunnel, and how to proceed to get it fixed.  She is getting worse.

## 2011-02-26 ENCOUNTER — Other Ambulatory Visit: Payer: Self-pay

## 2011-02-26 ENCOUNTER — Telehealth: Payer: Self-pay | Admitting: *Deleted

## 2011-02-26 DIAGNOSIS — G5600 Carpal tunnel syndrome, unspecified upper limb: Secondary | ICD-10-CM

## 2011-02-26 NOTE — Telephone Encounter (Signed)
Called for results of EMG.

## 2011-02-26 NOTE — Telephone Encounter (Signed)
Please obtain a copy for me of the nerve conduction report. I am unable to access this. Schedule appointment for a hand surgeon

## 2011-02-26 NOTE — Telephone Encounter (Signed)
[facility] er 75mg  - last seen 07/2010 last written 07/2010 #[id number] 4RF  [facility] - this drug can only be written for 6 mos at a time - gave ok for #90 1RF

## 2011-02-27 NOTE — Telephone Encounter (Signed)
Waiting for EMG results

## 2011-07-01 LAB — DIFFERENTIAL
Basophils Absolute: 0
Basophils Relative: 1
Eosinophils Absolute: 0.1
Eosinophils Relative: 2
Lymphocytes Relative: 25
Lymphs Abs: 1.8
Monocytes Absolute: 0.5
Monocytes Relative: 7
Neutro Abs: 4.7
Neutrophils Relative %: 66

## 2011-07-01 LAB — COMPREHENSIVE METABOLIC PANEL
ALT: 14
AST: 21
Albumin: 3.4 — ABNORMAL LOW
Alkaline Phosphatase: 79
BUN: 9
CO2: 28
Calcium: 8.9
Chloride: 103
Creatinine, Ser: 0.67
GFR calc Af Amer: >60
GFR calc non Af Amer: >60
Glucose, Bld: 125 — ABNORMAL HIGH
Potassium: 3.7
Sodium: 136
Total Bilirubin: 0.4
Total Protein: 6.4

## 2011-07-01 LAB — CBC
HCT: 34.7 — ABNORMAL LOW
Hemoglobin: 11.7 — ABNORMAL LOW
MCHC: 33.7
MCV: 92.2
Platelets: 313
RBC: 3.77 — ABNORMAL LOW
RDW: 12.7
WBC: 7.2

## 2011-07-01 LAB — PROTIME-INR
INR: 1.1
Prothrombin Time: 14.1

## 2011-08-25 ENCOUNTER — Other Ambulatory Visit: Payer: Self-pay | Admitting: Internal Medicine

## 2011-09-03 ENCOUNTER — Other Ambulatory Visit: Payer: Self-pay | Admitting: Internal Medicine

## 2011-10-06 ENCOUNTER — Ambulatory Visit: Payer: PRIVATE HEALTH INSURANCE | Admitting: Internal Medicine

## 2011-10-07 ENCOUNTER — Ambulatory Visit (INDEPENDENT_AMBULATORY_CARE_PROVIDER_SITE_OTHER): Payer: PRIVATE HEALTH INSURANCE | Admitting: Internal Medicine

## 2011-10-07 ENCOUNTER — Encounter: Payer: Self-pay | Admitting: Internal Medicine

## 2011-10-07 DIAGNOSIS — I10 Essential (primary) hypertension: Secondary | ICD-10-CM

## 2011-10-07 DIAGNOSIS — J069 Acute upper respiratory infection, unspecified: Secondary | ICD-10-CM

## 2011-10-07 MED ORDER — HYDROCODONE-HOMATROPINE 5-1.5 MG/5ML PO SYRP: 5.0000 mL | ORAL_SOLUTION | Freq: Four times a day (QID) | ORAL | Status: DC | PRN

## 2011-10-07 NOTE — Patient Instructions (Signed)
Get plenty of rest, Drink lots of  clear liquids, and use Tylenol or ibuprofen for fever and discomfort.    

## 2011-10-07 NOTE — Progress Notes (Signed)
  Subjective:    Patient ID: Hailey [name], female    DOB: 02/06/1961, 51 y.o.   MRN: [id number]  HPI 51 year old patient who has a history of treated hypertension. The past 6 days she has had head and chest congestion and mildly productive cough she has been using Mucinex. Her chief complaint is refractory cough which has resulted in some chest wall pain. There's been no fever or shortness of breath or wheezing. She has treated hypertension which has been stable.   Review of Systems  Constitutional: Negative.   HENT: Negative for hearing loss, congestion, sore throat, rhinorrhea, dental problem, sinus pressure and tinnitus.   Eyes: Negative for pain, discharge and visual disturbance.  Respiratory: Negative for cough and shortness of breath.   Cardiovascular: Negative for chest pain, palpitations and leg swelling.  Gastrointestinal: Negative for nausea, vomiting, abdominal pain, diarrhea, constipation, blood in stool and abdominal distention.  Genitourinary: Negative for dysuria, urgency, frequency, hematuria, flank pain, vaginal bleeding, vaginal discharge, difficulty urinating, vaginal pain and pelvic pain.  Musculoskeletal: Negative for joint swelling, arthralgias and gait problem.  Skin: Negative for rash.  Neurological: Negative for dizziness, syncope, speech difficulty, weakness, numbness and headaches.  Hematological: Negative for adenopathy.  Psychiatric/Behavioral: Negative for behavioral problems, dysphoric mood and agitation. The patient is not nervous/anxious.        Objective:   Physical Exam  Constitutional: She is oriented to person, place, and time. She appears well-developed and well-nourished.       Frequent paroxysms of coughing  HENT:  Head: Normocephalic.  Right Ear: External ear normal.  Left Ear: External ear normal.  Mouth/Throat: Oropharynx is clear and moist.  Eyes: Conjunctivae and EOM are normal. Pupils are equal, round, and reactive to light.  Neck:  Normal range of motion. Neck supple. No thyromegaly present.  Cardiovascular: Normal rate, regular rhythm, normal heart sounds and intact distal pulses.   Pulmonary/Chest: Effort normal and breath sounds normal.  Abdominal: Soft. Bowel sounds are normal. She exhibits no mass. There is no tenderness.  Musculoskeletal: Normal range of motion.  Lymphadenopathy:    She has no cervical adenopathy.  Neurological: She is alert and oriented to person, place, and time.  Skin: Skin is warm and dry. No rash noted.  Psychiatric: She has a normal mood and affect. Her behavior is normal.          Assessment & Plan:    Viral URI. Will treat symptomatically Hypertension well controlled we'll continue present regimen low-salt diet exercise regimen and weight loss all encouraged

## 2011-10-22 ENCOUNTER — Other Ambulatory Visit: Payer: Self-pay | Admitting: Internal Medicine

## 2011-10-22 MED ORDER — HYDROCODONE-HOMATROPINE 5-1.5 MG/5ML PO SYRP: 5.0000 mL | ORAL_SOLUTION | Freq: Four times a day (QID) | ORAL | Status: DC | PRN

## 2011-10-22 NOTE — Telephone Encounter (Signed)
6 oz 

## 2011-10-22 NOTE — Telephone Encounter (Signed)
Pt still has cough and congestion. Pt is req refill of HYDROcodone-homatropine (HYDROMET) 5-1.5 MG/5ML [facility] [address]

## 2011-10-22 NOTE — Telephone Encounter (Signed)
Rx called in to pharmacy. 

## 2011-10-22 NOTE — Telephone Encounter (Signed)
Rx last filled 10/07/11. Pt last seen 10/07/2011. Pls advise.

## 2012-02-09 ENCOUNTER — Other Ambulatory Visit: Payer: Self-pay

## 2012-02-09 NOTE — Telephone Encounter (Signed)
Patient will need to have this medication refilled by whoever initially prescribed it

## 2012-02-09 NOTE — Telephone Encounter (Signed)
[facility] diethyl[facility]poin er 75 qd Last seen 10/07/2011 URI Never rx'd in this EMR by you Please advise

## 2012-02-09 NOTE — Telephone Encounter (Signed)
[facility] - r/t not rx'd by this provider

## 2012-02-23 ENCOUNTER — Telehealth: Payer: Self-pay | Admitting: Internal Medicine

## 2012-02-23 MED ORDER — DIETHYLPROPION HCL ER 75 MG PO TB24: 1.0000 | ORAL_TABLET | Freq: Every day | ORAL | Status: DC

## 2012-02-23 NOTE — Telephone Encounter (Signed)
Faxed to pharmacy

## 2012-02-23 NOTE — Telephone Encounter (Signed)
Patient called back stating that she was informed by the pharmacy that she has to have an ov before she can get a refill. Patient is upset and wants to know why. Please advise.

## 2012-02-23 NOTE — Telephone Encounter (Signed)
Patient called stating that the pharmacy has requested her Diethylpropion HCl CR 75 MG TB24 3 times. Please assist.

## 2012-02-25 ENCOUNTER — Ambulatory Visit: Payer: PRIVATE HEALTH INSURANCE | Admitting: Internal Medicine

## 2012-04-03 ENCOUNTER — Other Ambulatory Visit: Payer: Self-pay | Admitting: Internal Medicine

## 2012-06-14 ENCOUNTER — Ambulatory Visit (INDEPENDENT_AMBULATORY_CARE_PROVIDER_SITE_OTHER): Payer: BC Managed Care – PPO | Admitting: Internal Medicine

## 2012-06-14 ENCOUNTER — Encounter: Payer: Self-pay | Admitting: Internal Medicine

## 2012-06-14 VITALS — BP 110/80 | Temp 97.9°F | Wt 259.0 lb

## 2012-06-14 DIAGNOSIS — Z23 Encounter for immunization: Secondary | ICD-10-CM

## 2012-06-14 DIAGNOSIS — I10 Essential (primary) hypertension: Secondary | ICD-10-CM

## 2012-06-14 MED ORDER — PHENTERMINE HCL 37.5 MG PO CAPS: 37.5000 mg | ORAL_CAPSULE | ORAL | Status: DC

## 2012-06-14 NOTE — Patient Instructions (Addendum)
Limit your sodium (Salt) intake  Please check your blood pressure on a regular basis.  If it is consistently greater than 150/90, please make an office appointment.    It is important that you exercise regularly, at least 20 minutes 3 to 4 times per week.  If you develop chest pain or shortness of breath seek  medical attention.  ,You need to lose weight.  Consider a lower calorie diet and regular exercise.  Return in 6 months for follow-up

## 2012-06-14 NOTE — Progress Notes (Signed)
Subjective:    Patient ID: Hailey [name], female    DOB: 1960-12-20, 51 y.o.   MRN: [id number]  HPI  50 year old patient who has a history of hypertension who presents with a chief complaint of swelling of the right anterior lower leg. She traumatizes area approximately 3 weeks ago and has had persistent soft tissue swelling. This has steadily improved and is much less tender.  She does spend 8 hours a day on her feet at work  Past Medical History  Diagnosis Date  . CARPAL TUNNEL SYNDROME, BILATERAL 08/04/2010  . GERD 03/06/2008  . HYPERTENSION 02/10/2007  . OBESITY NOS 02/10/2007  . Obesity     History   Social History  . Marital Status: Married    Spouse Name: N/A    Number of Children: N/A  . Years of Education: N/A   Occupational History  . Not on file.   Social History Main Topics  . Smoking status: Never Smoker   . Smokeless tobacco: Never Used  . Alcohol Use: Yes     rarley  . Drug Use: No  . Sexually Active: Not on file   Other Topics Concern  . Not on file   Social History Narrative  . No narrative on file    Past Surgical History  Procedure Date  . Nasal sinus surgery   . Hemorrhoid surgery     No family history on file.  Allergies  Allergen Reactions  . Sulfamethoxazole     REACTION: unspecified    Current Outpatient Prescriptions on File Prior to Visit  Medication Sig Dispense Refill  . benazepril-hydrochlorthiazide (LOTENSIN HCT) 20-25 MG per tablet TAKE 1 TABLET BY MOUTH EVERY DAY  90 tablet  2  . Diethylpropion HCl CR 75 MG TB24 Take 1 tablet (75 mg total) by mouth daily.  90 each  o  . naproxen (NAPROSYN) 500 MG tablet TAKE 1 TABLET BY MOUTH TWICE A DAY  180 tablet  1  . DISCONTD: furosemide (LASIX) 20 MG tablet Take 1 tablet (20 mg total) by mouth daily.  30 tablet  11  . cetirizine (ZYRTEC) 10 MG tablet Take 10 mg by mouth daily.        Marland Kitchen dextromethorphan-guaiFENesin (MUCINEX DM) 30-600 MG per 12 hr tablet Take 1 tablet by mouth daily.        Marland Kitchen HYDROcodone-homatropine (HYDROMET) 5-1.5 MG/5ML syrup Take 5 mLs by mouth every 6 (six) hours as needed.  120 mL  0  . phentermine 37.5 MG capsule Take 1 capsule (37.5 mg total) by mouth every morning.  60 capsule  2    BP 110/80  Temp 97.9 F (36.6 C) (Oral)  Wt 259 lb (117.482 kg)     Wt Readings from Last 3 Encounters:  06/14/12 259 lb (117.482 kg)  10/07/11 247 lb (112.038 kg)  02/09/11 249 lb (112.946 kg)    Review of Systems  Constitutional: Positive for unexpected weight change.  Skin: Positive for wound.       Objective:   Physical Exam  Constitutional: She appears well-developed and well-nourished. No distress.       Blood pressure 110/80  Skin:       Patient has soft tissue swelling mild tenderness in excess of 1 involving her right mid anterior lower leg. There is no calf tenderness or ankle or foot swelling. This appeared to be most consistent with a resolving hematoma.          Assessment & Plan:   Hypertension controlled  Resolving hematoma right lower leg. Doubt cellulitis or phlebitis. She report any clinical worsening or fever CPX 6 months

## 2012-06-16 ENCOUNTER — Telehealth: Payer: Self-pay | Admitting: Internal Medicine

## 2012-06-16 NOTE — Telephone Encounter (Signed)
[facility] [address] called and is req to change the phentermine 37.5 MG capsules to Tablets, because it is cheaper. Pls call pharmacy.

## 2012-06-16 NOTE — Telephone Encounter (Signed)
This was already requested by pharmacy and faxed ok back . KIK

## 2012-06-17 ENCOUNTER — Telehealth: Payer: Self-pay | Admitting: Internal Medicine

## 2012-06-17 NOTE — Telephone Encounter (Signed)
Caller: Olivia[facility]; Patient Name: Hailey [name]; PCP: Eleonore Chiquito [facility]); Best Callback Phone Number: [phone].  Patient has requested tablet instead of capsule form of her phentermine 37.5mg  which was written by Dr. Amador Cunas 06/14/12.  Per Epic, Rx is as described.  Per standing orders, change in form authorized; no further questions or concerns.

## 2012-06-20 MED ORDER — PHENTERMINE HCL 37.5 MG PO TABS: 37.5000 mg | ORAL_TABLET | Freq: Every day | ORAL | Status: DC

## 2012-06-20 NOTE — Telephone Encounter (Signed)
Change to med list done - and new rx faxed to [facility]

## 2012-12-05 ENCOUNTER — Other Ambulatory Visit (INDEPENDENT_AMBULATORY_CARE_PROVIDER_SITE_OTHER): Payer: BC Managed Care – PPO

## 2012-12-05 DIAGNOSIS — Z0000 Encounter for general adult medical examination without abnormal findings: Secondary | ICD-10-CM

## 2012-12-05 LAB — POCT URINALYSIS DIPSTICK
Blood, UA: NEGATIVE
Glucose, UA: NEGATIVE
Ketones, UA: NEGATIVE
Leukocytes, UA: NEGATIVE
Nitrite, UA: NEGATIVE
Spec Grav, UA: 1.015
Urobilinogen, UA: 0.2
pH, UA: 6

## 2012-12-05 LAB — BASIC METABOLIC PANEL
BUN: 12 mg/dL (ref 6–23)
CO2: 24 mEq/L (ref 19–32)
Calcium: 9.5 mg/dL (ref 8.4–10.5)
Chloride: 105 mEq/L (ref 96–112)
Creatinine, Ser: 0.7 mg/dL (ref 0.4–1.2)
GFR: 106.05 mL/min (ref >60.00)
Glucose, Bld: 95 mg/dL (ref 70–99)
Potassium: 3.7 mEq/L (ref 3.5–5.1)
Sodium: 139 mEq/L (ref 135–145)

## 2012-12-05 LAB — HEPATIC FUNCTION PANEL
ALT: 13 U/L (ref 0–35)
AST: 14 U/L (ref 0–37)
Albumin: 3.9 g/dL (ref 3.5–5.2)
Alkaline Phosphatase: 81 U/L (ref 39–117)
Bilirubin, Direct: 0 mg/dL (ref 0.0–0.3)
Total Bilirubin: 0.7 mg/dL (ref 0.3–1.2)
Total Protein: 7.5 g/dL (ref 6.0–8.3)

## 2012-12-05 LAB — CBC WITH DIFFERENTIAL/PLATELET
Basophils Absolute: 0.1 10*3/uL (ref 0.0–0.1)
Basophils Relative: 1.1 % (ref 0.0–3.0)
Eosinophils Absolute: 0.1 10*3/uL (ref 0.0–0.7)
Eosinophils Relative: 2.3 % (ref 0.0–5.0)
HCT: 37.5 % (ref 36.0–46.0)
Hemoglobin: 12.7 g/dL (ref 12.0–15.0)
Lymphocytes Relative: 32.6 % (ref 12.0–46.0)
Lymphs Abs: 1.6 10*3/uL (ref 0.7–4.0)
MCHC: 33.9 g/dL (ref 30.0–36.0)
MCV: 89.4 fl (ref 78.0–100.0)
Monocytes Absolute: 0.4 10*3/uL (ref 0.1–1.0)
Monocytes Relative: 8.9 % (ref 3.0–12.0)
Neutro Abs: 2.7 10*3/uL (ref 1.4–7.7)
Neutrophils Relative %: 55.1 % (ref 43.0–77.0)
Platelets: 369 10*3/uL (ref 150.0–400.0)
RBC: 4.19 Mil/uL (ref 3.87–5.11)
RDW: 13.5 % (ref 11.5–14.6)
WBC: 4.9 10*3/uL (ref 4.5–10.5)

## 2012-12-05 LAB — LIPID PANEL
Cholesterol: 161 mg/dL (ref 0–200)
HDL: 48.9 mg/dL (ref >39.00)
LDL Cholesterol: 102 mg/dL — ABNORMAL HIGH (ref 0–99)
Total CHOL/HDL Ratio: 3
Triglycerides: 51 mg/dL (ref 0.0–149.0)
VLDL: 10.2 mg/dL (ref 0.0–40.0)

## 2012-12-05 LAB — TSH: TSH: 1.41 u[IU]/mL (ref 0.35–5.50)

## 2012-12-16 ENCOUNTER — Ambulatory Visit (INDEPENDENT_AMBULATORY_CARE_PROVIDER_SITE_OTHER): Payer: BC Managed Care – PPO | Admitting: Internal Medicine

## 2012-12-16 ENCOUNTER — Encounter: Payer: Self-pay | Admitting: Internal Medicine

## 2012-12-16 VITALS — BP 120/70 | HR 76 | Temp 98.3°F | Resp 18 | Ht 64.25 in | Wt 218.0 lb

## 2012-12-16 DIAGNOSIS — E669 Obesity, unspecified: Secondary | ICD-10-CM

## 2012-12-16 DIAGNOSIS — Z0000 Encounter for general adult medical examination without abnormal findings: Secondary | ICD-10-CM

## 2012-12-16 DIAGNOSIS — K219 Gastro-esophageal reflux disease without esophagitis: Secondary | ICD-10-CM

## 2012-12-16 DIAGNOSIS — I10 Essential (primary) hypertension: Secondary | ICD-10-CM

## 2012-12-16 MED ORDER — PHENTERMINE HCL 37.5 MG PO TABS: 37.5000 mg | ORAL_TABLET | Freq: Every day | ORAL | Status: DC

## 2012-12-16 MED ORDER — BENAZEPRIL-HYDROCHLOROTHIAZIDE 20-25 MG PO TABS: ORAL_TABLET | ORAL | Status: DC

## 2012-12-16 NOTE — Patient Instructions (Signed)
Limit your sodium (Salt) intake  You need to lose weight.  Consider a lower calorie diet and regular exercise.  Please check your blood pressure on a regular basis.  If it is consistently greater than 150/90, please make an office appointment.  It  is important that you exercise regularly, at least 20 minutes 3 to 4 times per week.  If you develop chest pain or shortness of breath seek  medical attention.  Return in one year for follow-up    

## 2012-12-16 NOTE — Progress Notes (Signed)
Subjective:    Patient ID: Hailey [name], female    DOB: 1961/09/05, 52 y.o.   MRN: [id number]  HPI  52 year old patient who is seen today for a wellness exam  . She has a history of treated hypertension. She has a history also of exogenous obesity but has lost approximately 40 pounds over the past 6 months she is doing quite well today she is on a diet and regular exercise regimen. Her blood pressure has been well-controlled. No new concerns or complaints. She does have a annual gynecologic evaluation and had a screening colonoscopy 2 years ago.   Wt Readings from Last 3 Encounters:  12/16/12 218 lb (98.884 kg)  06/14/12 259 lb (117.482 kg)  10/07/11 247 lb (112.038 kg)   Past Medical History  Diagnosis Date  . CARPAL TUNNEL SYNDROME, BILATERAL 08/04/2010  . GERD 03/06/2008  . HYPERTENSION 02/10/2007  . OBESITY NOS 02/10/2007  . Obesity     History   Social History  . Marital Status: Married    Spouse Name: N/A    Number of Children: N/A  . Years of Education: N/A   Occupational History  . Not on file.   Social History Main Topics  . Smoking status: Never Smoker   . Smokeless tobacco: Never Used  . Alcohol Use: Yes     Comment: rarley  . Drug Use: No  . Sexually Active: Not on file   Other Topics Concern  . Not on file   Social History Narrative  . No narrative on file    Past Surgical History  Procedure Laterality Date  . Nasal sinus surgery    . Hemorrhoid surgery      No family history on file.  Allergies  Allergen Reactions  . Sulfamethoxazole     REACTION: unspecified    Current Outpatient Prescriptions on File Prior to Visit  Medication Sig Dispense Refill  . benazepril-hydrochlorthiazide (LOTENSIN HCT) 20-25 MG per tablet TAKE 1 TABLET BY MOUTH EVERY DAY  90 tablet  2  . cetirizine (ZYRTEC) 10 MG tablet Take 10 mg by mouth daily.        Marland Kitchen dextromethorphan-guaiFENesin (MUCINEX DM) 30-600 MG per 12 hr tablet Take 1 tablet by mouth as needed.       .  naproxen (NAPROSYN) 500 MG tablet TAKE 1 TABLET BY MOUTH TWICE A DAY  180 tablet  1  . phentermine (ADIPEX-P) 37.5 MG tablet Take 1 tablet (37.5 mg total) by mouth daily before breakfast.  60 tablet  2   No current facility-administered medications on file prior to visit.    BP 120/70  Pulse 76  Temp(Src) 98.3 F (36.8 C) (Oral)  Resp 18  Ht 5' 4.25" (1.632 m)  Wt 218 lb (98.884 kg)  BMI 37.13 kg/m2  SpO2 98%  LMP 10/11/2012      Review of Systems  Constitutional: Negative for fever, appetite change, fatigue and unexpected weight change.  HENT: Negative for hearing loss, ear pain, nosebleeds, congestion, sore throat, mouth sores, trouble swallowing, neck stiffness, dental problem, voice change, sinus pressure and tinnitus.   Eyes: Negative for photophobia, pain, redness and visual disturbance.  Respiratory: Negative for cough, chest tightness and shortness of breath.   Cardiovascular: Negative for chest pain, palpitations and leg swelling.  Gastrointestinal: Negative for nausea, vomiting, abdominal pain, diarrhea, constipation, blood in stool, abdominal distention and rectal pain.  Genitourinary: Negative for dysuria, urgency, frequency, hematuria, flank pain, vaginal bleeding, vaginal discharge, difficulty urinating, genital sores, vaginal  pain, menstrual problem and pelvic pain.  Musculoskeletal: Negative for back pain and arthralgias.  Skin: Negative for rash.  Neurological: Negative for dizziness, syncope, speech difficulty, weakness, light-headedness, numbness and headaches.  Hematological: Negative for adenopathy. Does not bruise/bleed easily.  Psychiatric/Behavioral: Negative for suicidal ideas, behavioral problems, self-injury, dysphoric mood and agitation. The patient is not nervous/anxious.        Objective:   Physical Exam  Constitutional: She is oriented to person, place, and time. She appears well-developed and well-nourished.  Weight 218 Blood pressure low  normal  HENT:  Head: Normocephalic and atraumatic.  Right Ear: External ear normal.  Left Ear: External ear normal.  Mouth/Throat: Oropharynx is clear and moist.  Eyes: Conjunctivae and EOM are normal.  Neck: Normal range of motion. Neck supple. No JVD present. No thyromegaly present.  Cardiovascular: Normal rate, regular rhythm, normal heart sounds and intact distal pulses.   No murmur heard. Pulmonary/Chest: Effort normal and breath sounds normal. She has no wheezes. She has no rales.  Abdominal: Soft. Bowel sounds are normal. She exhibits no distension and no mass. There is no tenderness. There is no rebound and no guarding.  Musculoskeletal: Normal range of motion. She exhibits no edema and no tenderness.  Neurological: She is alert and oriented to person, place, and time. She has normal reflexes. No cranial nerve deficit. She exhibits normal muscle tone. Coordination normal.  Skin: Skin is warm and dry. No rash noted.  Psychiatric: She has a normal mood and affect. Her behavior is normal.          Assessment & Plan:  Preventive health examination Hypertension well controlled Exogenous obesity. Nice improvement GERD stable  Recheck 1 year

## 2013-06-12 ENCOUNTER — Encounter: Payer: Self-pay | Admitting: Internal Medicine

## 2013-06-12 ENCOUNTER — Ambulatory Visit (INDEPENDENT_AMBULATORY_CARE_PROVIDER_SITE_OTHER): Payer: BC Managed Care – PPO | Admitting: Internal Medicine

## 2013-06-12 VITALS — BP 126/70 | HR 84 | Temp 98.0°F | Resp 18 | Wt 211.0 lb

## 2013-06-12 DIAGNOSIS — Z23 Encounter for immunization: Secondary | ICD-10-CM

## 2013-06-12 DIAGNOSIS — I10 Essential (primary) hypertension: Secondary | ICD-10-CM

## 2013-06-12 DIAGNOSIS — E669 Obesity, unspecified: Secondary | ICD-10-CM

## 2013-06-12 MED ORDER — PHENTERMINE HCL 37.5 MG PO TABS: 37.5000 mg | ORAL_TABLET | Freq: Every day | ORAL | Status: DC

## 2013-06-12 NOTE — Progress Notes (Signed)
Subjective:    Patient ID: Hailey [name], female    DOB: 05/20/1961, 52 y.o.   MRN: [id number]  HPI  Wt Readings from Last 3 Encounters:  06/12/13 211 lb (95.709 kg)  12/16/12 218 lb (98.884 kg)  06/14/12 259 lb (117.32 kg)    52 year old patient who has treated hypertension. She has a history also of exogenous base of the and has done remarkably well over the past year with better diet and regular exercise. She jogs 4 times per week. She is quite pleased with her progress. No concerns or complaints today.  Past Medical History  Diagnosis Date  . CARPAL TUNNEL SYNDROME, BILATERAL 08/04/2010  . GERD 03/06/2008  . HYPERTENSION 02/10/2007  . OBESITY NOS 02/10/2007  . Obesity     History   Social History  . Marital Status: Married    Spouse Name: N/A    Number of Children: N/A  . Years of Education: N/A   Occupational History  . Not on file.   Social History Main Topics  . Smoking status: Never Smoker   . Smokeless tobacco: Never Used  . Alcohol Use: Yes     Comment: rarley  . Drug Use: No  . Sexual Activity: Not on file   Other Topics Concern  . Not on file   Social History Narrative  . No narrative on file    Past Surgical History  Procedure Laterality Date  . Nasal sinus surgery    . Hemorrhoid surgery      No family history on file.  Allergies  Allergen Reactions  . Sulfamethoxazole     REACTION: unspecified    Current Outpatient Prescriptions on File Prior to Visit  Medication Sig Dispense Refill  . benazepril-hydrochlorthiazide (LOTENSIN HCT) 20-25 MG per tablet TAKE 1 TABLET BY MOUTH EVERY DAY  90 tablet  6  . cetirizine (ZYRTEC) 10 MG tablet Take 10 mg by mouth daily.        Marland Kitchen dextromethorphan-guaiFENesin (MUCINEX DM) 30-600 MG per 12 hr tablet Take 1 tablet by mouth as needed.        No current facility-administered medications on file prior to visit.    BP 126/70  Pulse 84  Temp(Src) 98 F (36.7 C) (Oral)  Resp 18  Wt 211 lb (95.709 kg)   BMI 35.93 kg/m2  SpO2 95%  LMP 06/01/2013      Review of Systems  Constitutional: Negative.   HENT: Negative for hearing loss, congestion, sore throat, rhinorrhea, dental problem, sinus pressure and tinnitus.   Eyes: Negative for pain, discharge and visual disturbance.  Respiratory: Negative for cough and shortness of breath.   Cardiovascular: Negative for chest pain, palpitations and leg swelling.  Gastrointestinal: Negative for nausea, vomiting, abdominal pain, diarrhea, constipation, blood in stool and abdominal distention.  Genitourinary: Negative for dysuria, urgency, frequency, hematuria, flank pain, vaginal bleeding, vaginal discharge, difficulty urinating, vaginal pain and pelvic pain.  Musculoskeletal: Negative for joint swelling, arthralgias and gait problem.  Skin: Negative for rash.  Neurological: Negative for dizziness, syncope, speech difficulty, weakness, numbness and headaches.  Hematological: Negative for adenopathy.  Psychiatric/Behavioral: Negative for behavioral problems, dysphoric mood and agitation. The patient is not nervous/anxious.        Objective:   Physical Exam  Constitutional: She is oriented to person, place, and time. She appears well-developed and well-nourished.  HENT:  Head: Normocephalic.  Right Ear: External ear normal.  Left Ear: External ear normal.  Mouth/Throat: Oropharynx is clear and moist.  Eyes:  Conjunctivae and EOM are normal. Pupils are equal, round, and reactive to light.  Neck: Normal range of motion. Neck supple. No thyromegaly present.  Cardiovascular: Normal rate, regular rhythm, normal heart sounds and intact distal pulses.   Pulmonary/Chest: Effort normal and breath sounds normal.  Abdominal: Soft. Bowel sounds are normal. She exhibits no mass. There is no tenderness.  Musculoskeletal: Normal range of motion.  Lymphadenopathy:    She has no cervical adenopathy.  Neurological: She is alert and oriented to person, place,  and time.  Skin: Skin is warm and dry. No rash noted.  Psychiatric: She has a normal mood and affect. Her behavior is normal.          Assessment & Plan:   Hypertension well controlled Exogenous obesity. Improved we'll continue present regimen. Her goal is a weight of 185. We'll recheck in 6 months. Hopefully discontinue phentermine at that time

## 2013-06-12 NOTE — Patient Instructions (Addendum)
Limit your sodium (Salt) intake    It is important that you exercise regularly, at least 20 minutes 3 to 4 times per week.  If you develop chest pain or shortness of breath seek  medical attention.  You need to lose weight.  Consider a lower calorie diet and regular exercise.  Return in 6 months for follow-up   

## 2013-07-25 ENCOUNTER — Telehealth: Payer: Self-pay | Admitting: Internal Medicine

## 2013-07-25 ENCOUNTER — Encounter: Payer: Self-pay | Admitting: Family Medicine

## 2013-07-25 ENCOUNTER — Ambulatory Visit (INDEPENDENT_AMBULATORY_CARE_PROVIDER_SITE_OTHER): Payer: BC Managed Care – PPO | Admitting: Family Medicine

## 2013-07-25 VITALS — BP 126/74 | HR 100 | Temp 98.1°F | Wt 208.0 lb

## 2013-07-25 DIAGNOSIS — J019 Acute sinusitis, unspecified: Secondary | ICD-10-CM

## 2013-07-25 MED ORDER — AZITHROMYCIN 250 MG PO TABS: ORAL_TABLET | ORAL | Status: DC

## 2013-07-25 NOTE — Telephone Encounter (Signed)
Patient Information:  Caller Name: Nasya  Phone: [phone]  Patient: Hailey [name], Hailey [name]  Gender: Female  DOB: July 06, 1961  Age: 52 Years  PCP: Eleonore Chiquito (Family Practice > 21yrs old)  Pregnant: No  Office Follow Up:  Does the office need to follow up with this patient?: No  Instructions For The Office: N/A   Symptoms  Reason For Call & Symptoms: Allergy symptoms with coughing fits especially at night, sneezing, itchy nose, and alot of nasal mucus after running outside in evenings 5 days a week. Current antihistamine not effective.   Reviewed Health History In EMR: Yes  Reviewed Medications In EMR: Yes  Reviewed Allergies In EMR: Yes  Reviewed Surgeries / Procedures: Yes  Date of Onset of Symptoms: 07/11/2013  Treatments Tried: Zyrtec D BID, Expecortant, Delsym, showering after running.  Treatments Tried Worked: Yes OB / GYN:  LMP: 06/06/2013  Guideline(s) Used:  Hay Fever - Nasal Allergies  Disposition Per Guideline:   See Today in Office  Reason For Disposition Reached:   Lots of coughing  Advice Given:  Avoiding Pollen:  Stay indoors on windy days  Keep windows closed in home, at least in bedroom; use air conditioner  Keep windows closed in car, turn AC on recirculate  Wash off Pollen Daily:  Remove pollen from the body with hair washing and a shower, especially before bedtime.  For a Stuffy Nose - Use Nasal Washes:  Introduction: Saline (salt water) nasal irrigation (nasal washes) is an effective and simple home remedy for treating stuffy nose and sinus congestion. The nose can be irrigated by pouring, spraying, or squirting salt water into the nose and then letting it run back out.  How it Helps: The salt water rinses out excess mucus, washes out any irritants (dust, allergens) that might be present, and moistens the nasal cavity.  Antihistamine Medications for Hay Fever:  Antihistamines help reduce sneezing, itching, and runny nose.  You may need to take  antihistamines continuously during pollen season (Reason: continuously is the key to control).  For Eye Allergies:   For eye symptoms, wash pollen off the face and eyelids. Then apply cold wet compresses. Oral antihistamines will usually bring all eye symptoms under control.  Call Back If:  You become worse  Patient Will Follow Care Advice:  YES  Appointment Scheduled:  07/25/2013 16:00:00 Appointment Scheduled Provider:  Gershon Crane [facility])

## 2013-07-26 ENCOUNTER — Encounter: Payer: Self-pay | Admitting: Family Medicine

## 2013-07-26 NOTE — Progress Notes (Signed)
  Subjective:    Patient ID: Hailey [name], female    DOB: 20-Feb-1961, 52 y.o.   MRN: [id number]  HPI Here for one week of sinus pressure, PND, HA, and a dry cough.    Review of Systems  Constitutional: Negative.   HENT: Positive for congestion, postnasal drip and sinus pressure.   Eyes: Negative.   Respiratory: Positive for cough.        Objective:   Physical Exam  Constitutional: She appears well-developed and well-nourished.  HENT:  Right Ear: External ear normal.  Left Ear: External ear normal.  Nose: Nose normal.  Mouth/Throat: Oropharynx is clear and moist.  Eyes: Conjunctivae are normal.  Pulmonary/Chest: Effort normal and breath sounds normal.  Lymphadenopathy:    She has no cervical adenopathy.          Assessment & Plan:  Add Mucinex.

## 2013-10-12 ENCOUNTER — Telehealth: Payer: Self-pay | Admitting: Internal Medicine

## 2013-10-12 NOTE — Telephone Encounter (Signed)
Patient Information:  Caller Name: Hailey [name]  Phone: [phone]  Patient: Hailey [name], Hailey [name]  Gender: Female  DOB: 06/17/1961  Age: 53 Years  PCP: Hailey [name], Peter (Family Practice > 5133yrs old)  Pregnant: No  Office Follow Up:  Does the office need to follow up with this patient?: No  Instructions For The Office: N/A  RN Note:  Offered the pt appt today and she refused.  Pt states if she is not better by tomorrow she will call back and schedule appt.  Pt request provider to call in Rx antibiotic for her sinus infection.   Symptoms  Reason For Call & Symptoms: Pt states "I have a sinus infection."  Reviewed Health History In EMR: Yes  Reviewed Medications In EMR: Yes  Reviewed Allergies In EMR: Yes  Reviewed Surgeries / Procedures: Yes  Date of Onset of Symptoms: 10/10/2013 OB / GYN:  LMP: Unknown  Guideline(s) Used:  Sinus Pain and Congestion  Disposition Per Guideline:   Go to Office Now  Reason For Disposition Reached:   Redness or swelling on the cheek, forehead, or around the eye  Advice Given:  Call Back If:   You become worse.  Patient Will Follow Care Advice:  YES

## 2013-10-12 NOTE — Telephone Encounter (Signed)
Noted  

## 2013-10-20 ENCOUNTER — Telehealth: Payer: Self-pay | Admitting: Internal Medicine

## 2013-10-20 DIAGNOSIS — J349 Unspecified disorder of nose and nasal sinuses: Secondary | ICD-10-CM

## 2013-10-20 NOTE — Telephone Encounter (Signed)
Okay to do referral to ENT? 

## 2013-10-20 NOTE — Telephone Encounter (Signed)
ok 

## 2013-10-20 NOTE — Telephone Encounter (Signed)
Pt would like referral  to ENT , Dr Haroldine Lawsrossley. Pt saw him yrs ago and would like to go back.  Pt has been dealing w/ sinus issues. Pt has lost 50 pounds, enjoys running, but can not breathe

## 2013-12-17 ENCOUNTER — Other Ambulatory Visit: Payer: Self-pay | Admitting: Internal Medicine

## 2014-03-13 ENCOUNTER — Ambulatory Visit (INDEPENDENT_AMBULATORY_CARE_PROVIDER_SITE_OTHER): Payer: 59 | Admitting: Internal Medicine

## 2014-03-13 ENCOUNTER — Encounter: Payer: Self-pay | Admitting: Internal Medicine

## 2014-03-13 VITALS — BP 136/88 | HR 81 | Temp 98.1°F | Resp 20 | Ht 64.25 in | Wt 212.0 lb

## 2014-03-13 DIAGNOSIS — K219 Gastro-esophageal reflux disease without esophagitis: Secondary | ICD-10-CM

## 2014-03-13 DIAGNOSIS — E669 Obesity, unspecified: Secondary | ICD-10-CM

## 2014-03-13 DIAGNOSIS — I10 Essential (primary) hypertension: Secondary | ICD-10-CM

## 2014-03-13 MED ORDER — BENAZEPRIL-HYDROCHLOROTHIAZIDE 20-25 MG PO TABS: ORAL_TABLET | ORAL | Status: DC

## 2014-03-13 MED ORDER — PHENTERMINE HCL 37.5 MG PO TABS: 37.5000 mg | ORAL_TABLET | Freq: Every day | ORAL | Status: DC

## 2014-03-13 NOTE — Progress Notes (Signed)
Subjective:    Patient ID: Hailey [name], female    DOB: 03/07/1961, 53 y.o.   MRN: [id number]  HPI 53 year old patient who is seen today for followup.  She has a history of treated hypertension, as well as exogenous obesity.  She is doing quite well and still continues her vigorous exercise regimen.  She feels quite well.  Her weight is fairly stable, but she feels much more fit with her frequent jogging and other exercise regimens.  No new concerns or complaints.  Past Medical History  Diagnosis Date  . CARPAL TUNNEL SYNDROME, BILATERAL 08/04/2010  . GERD 03/06/2008  . HYPERTENSION 02/10/2007  . OBESITY NOS 02/10/2007  . Obesity     History   Social History  . Marital Status: Married    Spouse Name: N/A    Number of Children: N/A  . Years of Education: N/A   Occupational History  . Not on file.   Social History Main Topics  . Smoking status: Never Smoker   . Smokeless tobacco: Never Used  . Alcohol Use: Yes     Comment: rarley  . Drug Use: No  . Sexual Activity: Not on file   Other Topics Concern  . Not on file   Social History Narrative  . No narrative on file    Past Surgical History  Procedure Laterality Date  . Nasal sinus surgery    . Hemorrhoid surgery      No family history on file.  Allergies  Allergen Reactions  . Sulfamethoxazole     REACTION: unspecified    No current outpatient prescriptions on file prior to visit.   No current facility-administered medications on file prior to visit.    BP 136/88  Pulse 81  Temp(Src) 98.1 F (36.7 C) (Oral)  Resp 20  Ht 5' 4.25" (1.632 m)  Wt 212 lb (96.163 kg)  BMI 36.11 kg/m2  SpO2 98%         Review of Systems  Constitutional: Negative.   HENT: Negative for congestion, dental problem, hearing loss, rhinorrhea, sinus pressure, sore throat and tinnitus.   Eyes: Negative for pain, discharge and visual disturbance.  Respiratory: Negative for cough and shortness of breath.   Cardiovascular:  Negative for chest pain, palpitations and leg swelling.  Gastrointestinal: Negative for nausea, vomiting, abdominal pain, diarrhea, constipation, blood in stool and abdominal distention.  Genitourinary: Negative for dysuria, urgency, frequency, hematuria, flank pain, vaginal bleeding, vaginal discharge, difficulty urinating, vaginal pain and pelvic pain.  Musculoskeletal: Negative for arthralgias, gait problem and joint swelling.  Skin: Negative for rash.  Neurological: Negative for dizziness, syncope, speech difficulty, weakness, numbness and headaches.  Hematological: Negative for adenopathy.  Psychiatric/Behavioral: Negative for behavioral problems, dysphoric mood and agitation. The patient is not nervous/anxious.        Objective:   Physical Exam  Constitutional: She is oriented to person, place, and time. She appears well-developed and well-nourished.  Blood pressure 138/78  HENT:  Head: Normocephalic.  Right Ear: External ear normal.  Left Ear: External ear normal.  Mouth/Throat: Oropharynx is clear and moist.  Eyes: Conjunctivae and EOM are normal. Pupils are equal, round, and reactive to light.  Neck: Normal range of motion. Neck supple. No thyromegaly present.  Cardiovascular: Normal rate, regular rhythm, normal heart sounds and intact distal pulses.   Pulmonary/Chest: Effort normal and breath sounds normal.  Abdominal: Soft. Bowel sounds are normal. She exhibits no mass. There is no tenderness.  Musculoskeletal: Normal range of motion.  Lymphadenopathy:    She has no cervical adenopathy.  Neurological: She is alert and oriented to person, place, and time.  Skin: Skin is warm and dry. No rash noted.  Psychiatric: She has a normal mood and affect. Her behavior is normal.          Assessment & Plan:  Hypertension Obesity  No change in medical regimen We'll continue her vigorous exercise regimen Annual exam in 6 months Medications updated

## 2014-03-13 NOTE — Progress Notes (Signed)
Pre-visit discussion using our clinic review tool. No additional management support is needed unless otherwise documented below in the visit note.  

## 2014-03-13 NOTE — Patient Instructions (Signed)
Limit your sodium (Salt) intake  Please check your blood pressure on a regular basis.  If it is consistently greater than 150/90, please make an office appointment.    It is important that you exercise regularly, at least 20 minutes 3 to 4 times per week.  If you develop chest pain or shortness of breath seek  medical attention.  Return in 6 months for follow-up  

## 2014-03-14 ENCOUNTER — Telehealth: Payer: Self-pay | Admitting: Internal Medicine

## 2014-03-14 NOTE — Telephone Encounter (Signed)
Relevant patient education assigned to patient using Emmi. ° °

## 2014-06-11 ENCOUNTER — Other Ambulatory Visit: Payer: Self-pay | Admitting: Otolaryngology

## 2014-07-16 ENCOUNTER — Other Ambulatory Visit: Payer: 59

## 2014-07-24 ENCOUNTER — Encounter: Payer: 59 | Admitting: Internal Medicine

## 2014-09-10 ENCOUNTER — Other Ambulatory Visit (HOSPITAL_COMMUNITY): Payer: Self-pay | Admitting: Obstetrics and Gynecology

## 2014-09-10 ENCOUNTER — Other Ambulatory Visit: Payer: Self-pay | Admitting: Obstetrics and Gynecology

## 2014-09-10 DIAGNOSIS — E041 Nontoxic single thyroid nodule: Secondary | ICD-10-CM

## 2014-09-12 LAB — CYTOLOGY - PAP

## 2014-09-26 ENCOUNTER — Ambulatory Visit (HOSPITAL_COMMUNITY)
Admission: RE | Admit: 2014-09-26 | Discharge: 2014-09-26 | Disposition: A | Discharge status: Home / Self Care | Payer: 59 | Source: Ambulatory Visit | Attending: Obstetrics and Gynecology | Admitting: Obstetrics and Gynecology

## 2014-09-26 DIAGNOSIS — E0789 Other specified disorders of thyroid: Secondary | ICD-10-CM | POA: Diagnosis not present

## 2014-09-26 DIAGNOSIS — E041 Nontoxic single thyroid nodule: Secondary | ICD-10-CM

## 2014-10-10 ENCOUNTER — Other Ambulatory Visit (INDEPENDENT_AMBULATORY_CARE_PROVIDER_SITE_OTHER): Payer: 59

## 2014-10-10 DIAGNOSIS — Z0000 Encounter for general adult medical examination without abnormal findings: Secondary | ICD-10-CM

## 2014-10-10 LAB — LIPID PANEL
CHOLESTEROL: 173 mg/dL (ref 0–200)
HDL: 63.6 mg/dL (ref >39.00)
LDL Cholesterol: 103 mg/dL — ABNORMAL HIGH (ref 0–99)
NonHDL: 109.4
Total CHOL/HDL Ratio: 3
Triglycerides: 31 mg/dL (ref 0.0–149.0)
VLDL: 6.2 mg/dL (ref 0.0–40.0)

## 2014-10-10 LAB — CBC WITH DIFFERENTIAL/PLATELET
BASOS PCT: 1 % (ref 0.0–3.0)
Basophils Absolute: 0.1 10*3/uL (ref 0.0–0.1)
Eosinophils Absolute: 0.2 10*3/uL (ref 0.0–0.7)
Eosinophils Relative: 2.9 % (ref 0.0–5.0)
HCT: 38.1 % (ref 36.0–46.0)
HEMOGLOBIN: 12.4 g/dL (ref 12.0–15.0)
LYMPHS PCT: 33.3 % (ref 12.0–46.0)
Lymphs Abs: 1.8 10*3/uL (ref 0.7–4.0)
MCHC: 32.7 g/dL (ref 30.0–36.0)
MCV: 92.4 fl (ref 78.0–100.0)
Monocytes Absolute: 0.6 10*3/uL (ref 0.1–1.0)
Monocytes Relative: 10.4 % (ref 3.0–12.0)
Neutro Abs: 2.8 10*3/uL (ref 1.4–7.7)
Neutrophils Relative %: 52.4 % (ref 43.0–77.0)
Platelets: 379 10*3/uL (ref 150.0–400.0)
RBC: 4.12 Mil/uL (ref 3.87–5.11)
RDW: 14.1 % (ref 11.5–15.5)
WBC: 5.4 10*3/uL (ref 4.0–10.5)

## 2014-10-10 LAB — HEPATIC FUNCTION PANEL
ALT: 28 U/L (ref 0–35)
AST: 25 U/L (ref 0–37)
Albumin: 4.1 g/dL (ref 3.5–5.2)
Alkaline Phosphatase: 100 U/L (ref 39–117)
BILIRUBIN TOTAL: 0.5 mg/dL (ref 0.2–1.2)
Bilirubin, Direct: 0.1 mg/dL (ref 0.0–0.3)
TOTAL PROTEIN: 7.5 g/dL (ref 6.0–8.3)

## 2014-10-10 LAB — BASIC METABOLIC PANEL
BUN: 18 mg/dL (ref 6–23)
CHLORIDE: 105 meq/L (ref 96–112)
CO2: 26 meq/L (ref 19–32)
Calcium: 9.2 mg/dL (ref 8.4–10.5)
Creatinine, Ser: 0.65 mg/dL (ref 0.40–1.20)
GFR: 122.3 mL/min (ref >60.00)
Glucose, Bld: 72 mg/dL (ref 70–99)
Potassium: 3.8 mEq/L (ref 3.5–5.1)
SODIUM: 139 meq/L (ref 135–145)

## 2014-10-10 LAB — POCT URINALYSIS DIPSTICK
Bilirubin, UA: NEGATIVE
Blood, UA: NEGATIVE
Glucose, UA: NEGATIVE
KETONES UA: NEGATIVE
Leukocytes, UA: NEGATIVE
Nitrite, UA: NEGATIVE
PH UA: 5.5
PROTEIN UA: NEGATIVE
Spec Grav, UA: 1.025
UROBILINOGEN UA: 0.2

## 2014-10-10 LAB — TSH: TSH: 1.56 u[IU]/mL (ref 0.35–4.50)

## 2014-10-15 ENCOUNTER — Ambulatory Visit (INDEPENDENT_AMBULATORY_CARE_PROVIDER_SITE_OTHER): Payer: 59 | Admitting: Internal Medicine

## 2014-10-15 ENCOUNTER — Encounter: Payer: Self-pay | Admitting: Internal Medicine

## 2014-10-15 VITALS — BP 112/74 | HR 76 | Temp 97.9°F | Resp 20 | Ht 65.0 in | Wt 210.0 lb

## 2014-10-15 DIAGNOSIS — Z23 Encounter for immunization: Secondary | ICD-10-CM

## 2014-10-15 DIAGNOSIS — Z0000 Encounter for general adult medical examination without abnormal findings: Secondary | ICD-10-CM

## 2014-10-15 DIAGNOSIS — I10 Essential (primary) hypertension: Secondary | ICD-10-CM

## 2014-10-15 DIAGNOSIS — K219 Gastro-esophageal reflux disease without esophagitis: Secondary | ICD-10-CM

## 2014-10-15 MED ORDER — BENAZEPRIL HCL 20 MG PO TABS: 20.0000 mg | ORAL_TABLET | Freq: Every day | ORAL | Status: DC

## 2014-10-15 NOTE — Patient Instructions (Addendum)
Limit your sodium (Salt) intake  Please check your blood pressure on a regular basis.  If it is consistently greater than 150/90, please make an office appointment.    It is important that you exercise regularly, at least 20 minutes 3 to 4 times per week.  If you develop chest pain or shortness of breath seek  medical attention.  Return in one year for follow-up Health Maintenance Adopting a healthy lifestyle and getting preventive care can go a long way to promote health and wellness. Talk with your health care provider about what schedule of regular examinations is right for you. This is a good chance for you to check in with your provider about disease prevention and staying healthy. In between checkups, there are plenty of things you can do on your own. Experts have done a lot of research about which lifestyle changes and preventive measures are most likely to keep you healthy. Ask your health care provider for more information. WEIGHT AND DIET  Eat a healthy diet  Be sure to include plenty of vegetables, fruits, low-fat dairy products, and lean protein.  Do not eat a lot of foods high in solid fats, added sugars, or salt.  Get regular exercise. This is one of the most important things you can do for your health.  Most adults should exercise for at least 150 minutes each week. The exercise should increase your heart rate and make you sweat (moderate-intensity exercise).  Most adults should also do strengthening exercises at least twice a week. This is in addition to the moderate-intensity exercise.  Maintain a healthy weight  Body mass index (BMI) is a measurement that can be used to identify possible weight problems. It estimates body fat based on height and weight. Your health care provider can help determine your BMI and help you achieve or maintain a healthy weight.  For females 54 years of age and older:   A BMI below 18.5 is considered underweight.  A BMI of 18.5 to 24.9 is  normal.  A BMI of 25 to 29.9 is considered overweight.  A BMI of 30 and above is considered obese.  Watch levels of cholesterol and blood lipids  You should start having your blood tested for lipids and cholesterol at 54 years of age, then have this test every 5 years.  You may need to have your cholesterol levels checked more often if:  Your lipid or cholesterol levels are high.  You are older than 54 years of age.  You are at high risk for heart disease.  CANCER SCREENING   Lung Cancer  Lung cancer screening is recommended for adults 54-54 years old who are at high risk for lung cancer because of a history of smoking.  A yearly low-dose CT scan of the lungs is recommended for people who:  Currently smoke.  Have quit within the past 15 years.  Have at least a 30-pack-year history of smoking. A pack year is smoking an average of one pack of cigarettes a day for 1 year.  Yearly screening should continue until it has been 15 years since you quit.  Yearly screening should stop if you develop a health problem that would prevent you from having lung cancer treatment.  Breast Cancer  Practice breast self-awareness. This means understanding how your breasts normally appear and feel.  It also means doing regular breast self-exams. Let your health care provider know about any changes, no matter how small.  If you are in your 54s  or 30s, you should have a clinical breast exam (CBE) by a health care provider every 1-3 years as part of a regular health exam.  If you are 40 or older, have a CBE every year. Also consider having a breast X-ray (mammogram) every year.  If you are older than 54, have a CBE every year. Also consider having a breast X-ray (mammogram) every year., talk to your health care provider about genetic screening.  If you are at high risk for breast cancer, talk to your health care provider about having an MRI and a mammogram every year.  Breast cancer gene (BRCA) assessment is recommended for women who have family members  with BRCA-related cancers. BRCA-related cancers include:  Breast.  Ovarian.  Tubal.  Peritoneal cancers.  Results of the assessment will determine the need for genetic counseling and BRCA1 and BRCA2 testing. Cervical Cancer Routine pelvic examinations to screen for cervical cancer are no longer recommended for nonpregnant women who are considered low risk for cancer of the pelvic organs (ovaries, uterus, and vagina) and who do not have symptoms. A pelvic examination may be necessary if you have symptoms including those associated with pelvic infections. Ask your health care provider if a screening pelvic exam is right for you.   The Pap test is the screening test for cervical cancer for women who are considered at risk.  If you had a hysterectomy for a problem that was not cancer or a condition that could lead to cancer, then you no longer need Pap tests.  If you are older than 65 years, and you have had normal Pap tests for the past 10 years, you no longer need to have Pap tests.  If you have had past treatment for cervical cancer or a condition that could lead to cancer, you need Pap tests and screening for cancer for at least 20 years after your treatment.  If you no longer get a Pap test, assess your risk factors if they change (such as having a new sexual partner). This can affect whether you should start being screened again.  Some women have medical problems that increase their chance of getting cervical cancer. If this is the case for you, your health care provider may recommend more frequent screening and Pap tests.  The human papillomavirus (HPV) test is another test that may be used for cervical cancer screening. The HPV test looks for the virus that can cause cell changes in the cervix. The cells collected during the Pap test can be tested for HPV.  The HPV test can be used to screen women 30 years of age and older. Getting tested for HPV can extend the interval between normal  Pap tests from three to five years.  An HPV test also should be used to screen women of any age who have unclear Pap test results.  After 54 years of age, women should have HPV testing as often as Pap tests.  Colorectal Cancer  This type of cancer can be detected and often prevented.  Routine colorectal cancer screening usually begins at 54 years of age and continues through 54 years of age.  Your health care provider may recommend screening at an earlier age if you have risk factors for colon cancer.  Your health care provider may also recommend using home test kits to check for hidden blood in the stool.  A small camera at the end of a tube can be used to examine your colon directly (sigmoidoscopy or colonoscopy). This is done to check for the earliest forms of colorectal cancer.    Routine screening usually begins at age 50.  Direct examination of the colon should be repeated every 5-10 years through 54 years of age. However, you may need to be screened more often if early forms of precancerous polyps or small growths are found. Skin Cancer  Check your skin from head to toe regularly.  Tell your health care provider about any new moles or changes in moles, especially if there is a change in a mole's shape or color.  Also tell your health care provider if you have a mole that is larger than the size of a pencil eraser.  Always use sunscreen. Apply sunscreen liberally and repeatedly throughout the day.  Protect yourself by wearing long sleeves, pants, a wide-brimmed hat, and sunglasses whenever you are outside. HEART DISEASE, DIABETES, AND HIGH BLOOD PRESSURE   Have your blood pressure checked at least every 1-2 years. High blood pressure causes heart disease and increases the risk of stroke.  If you are between 55 years and 79 years old, ask your health care provider if you should take aspirin to prevent strokes.  Have regular diabetes screenings. This involves taking a blood  sample to check your fasting blood sugar level.  If you are at a normal weight and have a low risk for diabetes, have this test once every three years after 54 years of age.  If you are overweight and have a high risk for diabetes, consider being tested at a younger age or more often. PREVENTING INFECTION  Hepatitis B  If you have a higher risk for hepatitis B, you should be screened for this virus. You are considered at high risk for hepatitis B if:  You were born in a country where hepatitis B is common. Ask your health care provider which countries are considered high risk.  Your parents were born in a high-risk country, and you have not been immunized against hepatitis B (hepatitis B vaccine).  You have HIV or AIDS.  You use needles to inject street drugs.  You live with someone who has hepatitis B.  You have had sex with someone who has hepatitis B.  You get hemodialysis treatment.  You take certain medicines for conditions, including cancer, organ transplantation, and autoimmune conditions. Hepatitis C  Blood testing is recommended for:  Everyone born from 1945 through 1965.  Anyone with known risk factors for hepatitis C. Sexually transmitted infections (STIs)  You should be screened for sexually transmitted infections (STIs) including gonorrhea and chlamydia if:  You are sexually active and are younger than 54 years of age.  You are older than 54 years of age and your health care provider tells you that you are at risk for this type of infection.  Your sexual activity has changed since you were last screened and you are at an increased risk for chlamydia or gonorrhea. Ask your health care provider if you are at risk.  If you do not have HIV, but are at risk, it may be recommended that you take a prescription medicine daily to prevent HIV infection. This is called pre-exposure prophylaxis (PrEP). You are considered at risk if:  You are sexually active and do not  regularly use condoms or know the HIV status of your partner(s).  You take drugs by injection.  You are sexually active with a partner who has HIV. Talk with your health care provider about whether you are at high risk of being infected with HIV. If you choose to begin PrEP, you should first   be tested for HIV. You should then be tested every 3 months for as long as you are taking PrEP.  PREGNANCY   If you are premenopausal and you may become pregnant, ask your health care provider about preconception counseling.  If you may become pregnant, take 400 to 800 micrograms (mcg) of folic acid every day.  If you want to prevent pregnancy, talk to your health care provider about birth control (contraception). OSTEOPOROSIS AND MENOPAUSE   Osteoporosis is a disease in which the bones lose minerals and strength with aging. This can result in serious bone fractures. Your risk for osteoporosis can be identified using a bone density scan.  If you are 65 years of age or older, or if you are at risk for osteoporosis and fractures, ask your health care provider if you should be screened.  Ask your health care provider whether you should take a calcium or vitamin D supplement to lower your risk for osteoporosis.  Menopause may have certain physical symptoms and risks.  Hormone replacement therapy may reduce some of these symptoms and risks. Talk to your health care provider about whether hormone replacement therapy is right for you.  HOME CARE INSTRUCTIONS   Schedule regular health, dental, and eye exams.  Stay current with your immunizations.   Do not use any tobacco products including cigarettes, chewing tobacco, or electronic cigarettes.  If you are pregnant, do not drink alcohol.  If you are breastfeeding, limit how much and how often you drink alcohol.  Limit alcohol intake to no more than 1 drink per day for nonpregnant women. One drink equals 12 ounces of beer, 5 ounces of wine, or 1  ounces of hard liquor.  Do not use street drugs.  Do not share needles.  Ask your health care provider for help if you need support or information about quitting drugs.  Tell your health care provider if you often feel depressed.  Tell your health care provider if you have ever been abused or do not feel safe at home. Document Released: 03/30/2011 Document Revised: 01/29/2014 Document Reviewed: 08/16/2013 [employer] Patient Information 2015 [employer]. This information is not intended to replace advice given to you by your health care provider. Make sure you discuss any questions you have with your health care provider.  

## 2014-10-15 NOTE — Progress Notes (Signed)
Subjective:    Patient ID: Hailey [name], female    DOB: 01/12/1961, 54 y.o.   MRN: [id number]  HPI  54 year-old patient who is seen today for a wellness exam  .  She has a history of treated hypertension. She has a history also of exogenous obesity but has lost approximately 40 pounds in the past.   today she is on a diet and regular exercise regimen. Her blood pressure has been well-controlled. No new concerns or complaints. She does have a annual gynecologic evaluation and had a screening colonoscopy 4 years ago.  She continues to exercise regular with a personal trainer  She has sinus surgery with Dr. Haroldine Lawsrossley for a deviated septum in September 2015  Wt Readings from Last 3 Encounters:  10/15/14 210 lb (95.255 kg)  03/13/14 212 lb (96.163 kg)  07/25/13 208 lb (94.348 kg)   Past Medical History  Diagnosis Date  . CARPAL TUNNEL SYNDROME, BILATERAL 08/04/2010  . GERD 03/06/2008  . HYPERTENSION 02/10/2007  . OBESITY NOS 02/10/2007  . Obesity     History   Social History  . Marital Status: Married    Spouse Name: N/A    Number of Children: N/A  . Years of Education: N/A   Occupational History  . Not on file.   Social History Main Topics  . Smoking status: Never Smoker   . Smokeless tobacco: Never Used  . Alcohol Use: Yes     Comment: rarley  . Drug Use: No  . Sexual Activity: Not on file   Other Topics Concern  . Not on file   Social History Narrative    Past Surgical History  Procedure Laterality Date  . Nasal sinus surgery    . Hemorrhoid surgery      No family history on file.  Allergies  Allergen Reactions  . Sulfamethoxazole     REACTION: unspecified    Current Outpatient Prescriptions on File Prior to Visit  Medication Sig Dispense Refill  . benazepril-hydrochlorthiazide (LOTENSIN HCT) 20-25 MG per tablet TAKE 1 TABLET BY MOUTH EVERY DAY 90 tablet 1   No current facility-administered medications on file prior to visit.    BP 112/74 mmHg  Pulse  76  Temp(Src) 97.9 F (36.6 C) (Oral)  Resp 20  Ht 5\' 5"  (1.651 m)  Wt 210 lb (95.255 kg)  BMI 34.95 kg/m2  SpO2 98%  LMP 03/14/2014 (Approximate)      Review of Systems  Constitutional: Negative for fever, appetite change, fatigue and unexpected weight change.  HENT: Negative for congestion, dental problem, ear pain, hearing loss, mouth sores, nosebleeds, sinus pressure, sore throat, tinnitus, trouble swallowing and voice change.   Eyes: Negative for photophobia, pain, redness and visual disturbance.  Respiratory: Negative for cough, chest tightness and shortness of breath.   Cardiovascular: Negative for chest pain, palpitations and leg swelling.  Gastrointestinal: Negative for nausea, vomiting, abdominal pain, diarrhea, constipation, blood in stool, abdominal distention and rectal pain.  Genitourinary: Negative for dysuria, urgency, frequency, hematuria, flank pain, vaginal bleeding, vaginal discharge, difficulty urinating, genital sores, vaginal pain, menstrual problem and pelvic pain.  Musculoskeletal: Negative for back pain, arthralgias and neck stiffness.  Skin: Negative for rash.  Neurological: Negative for dizziness, syncope, speech difficulty, weakness, light-headedness, numbness and headaches.  Hematological: Negative for adenopathy. Does not bruise/bleed easily.  Psychiatric/Behavioral: Negative for suicidal ideas, behavioral problems, self-injury, dysphoric mood and agitation. The patient is not nervous/anxious.        Objective:   Physical  Exam  Constitutional: She is oriented to person, place, and time. She appears well-developed and well-nourished.  Weight 218 Blood pressure low normal  HENT:  Head: Normocephalic and atraumatic.  Right Ear: External ear normal.  Left Ear: External ear normal.  Mouth/Throat: Oropharynx is clear and moist.  Pharyngeal crowding  Eyes: Conjunctivae and EOM are normal.  Neck: Normal range of motion. Neck supple. No JVD present. No  thyromegaly present.  Cardiovascular: Normal rate, regular rhythm, normal heart sounds and intact distal pulses.   No murmur heard. Pulmonary/Chest: Effort normal and breath sounds normal. She has no wheezes. She has no rales.  Abdominal: Soft. Bowel sounds are normal. She exhibits no distension and no mass. There is no tenderness. There is no rebound and no guarding.  Musculoskeletal: Normal range of motion. She exhibits no edema or tenderness.  Neurological: She is alert and oriented to person, place, and time. She has normal reflexes. No cranial nerve deficit. She exhibits normal muscle tone. Coordination normal.  Skin: Skin is warm and dry. No rash noted.  Psychiatric: She has a normal mood and affect. Her behavior is normal.          Assessment & Plan:  Preventive health examination Hypertension well controlled.  Discontinue hydrochlorothiazide and continue benazepril only Exogenous obesity. Nice improvement GERD stable  Recheck 1 year

## 2014-10-15 NOTE — Progress Notes (Signed)
Pre visit review using our clinic review tool, if applicable. No additional management support is needed unless otherwise documented below in the visit note. 

## 2015-02-18 ENCOUNTER — Telehealth: Payer: Self-pay

## 2015-02-18 NOTE — Telephone Encounter (Signed)
Called and spoke with pt and pt states she has her yearly mammogram with Dr. Arelia SneddonMcComb.  Called [facility] and had mammogram faxed to office. HM updated and fax sent to scan.

## 2015-08-14 ENCOUNTER — Ambulatory Visit (INDEPENDENT_AMBULATORY_CARE_PROVIDER_SITE_OTHER): Payer: BLUE CROSS/BLUE SHIELD | Admitting: *Deleted

## 2015-08-14 DIAGNOSIS — Z23 Encounter for immunization: Secondary | ICD-10-CM | POA: Diagnosis not present

## 2015-10-14 ENCOUNTER — Other Ambulatory Visit (INDEPENDENT_AMBULATORY_CARE_PROVIDER_SITE_OTHER): Payer: BLUE CROSS/BLUE SHIELD

## 2015-10-14 DIAGNOSIS — Z0000 Encounter for general adult medical examination without abnormal findings: Secondary | ICD-10-CM

## 2015-10-14 LAB — CBC WITH DIFFERENTIAL/PLATELET
Basophils Absolute: 0 10*3/uL (ref 0.0–0.1)
Basophils Relative: 0.8 % (ref 0.0–3.0)
EOS ABS: 0.1 10*3/uL (ref 0.0–0.7)
EOS PCT: 2.2 % (ref 0.0–5.0)
HCT: 38.1 % (ref 36.0–46.0)
HEMOGLOBIN: 12.6 g/dL (ref 12.0–15.0)
LYMPHS PCT: 32.1 % (ref 12.0–46.0)
Lymphs Abs: 1.4 10*3/uL (ref 0.7–4.0)
MCHC: 33.1 g/dL (ref 30.0–36.0)
MCV: 91.6 fl (ref 78.0–100.0)
Monocytes Absolute: 0.5 10*3/uL (ref 0.1–1.0)
Monocytes Relative: 12 % (ref 3.0–12.0)
Neutro Abs: 2.3 10*3/uL (ref 1.4–7.7)
Neutrophils Relative %: 52.9 % (ref 43.0–77.0)
Platelets: 298 10*3/uL (ref 150.0–400.0)
RBC: 4.16 Mil/uL (ref 3.87–5.11)
RDW: 14.1 % (ref 11.5–15.5)
WBC: 4.4 10*3/uL (ref 4.0–10.5)

## 2015-10-14 LAB — BASIC METABOLIC PANEL
BUN: 19 mg/dL (ref 6–23)
CHLORIDE: 106 meq/L (ref 96–112)
CO2: 27 meq/L (ref 19–32)
CREATININE: 0.7 mg/dL (ref 0.40–1.20)
Calcium: 9.7 mg/dL (ref 8.4–10.5)
GFR: 111.85 mL/min (ref >60.00)
Glucose, Bld: 85 mg/dL (ref 70–99)
POTASSIUM: 5.7 meq/L — AB (ref 3.5–5.1)
SODIUM: 143 meq/L (ref 135–145)

## 2015-10-14 LAB — LIPID PANEL
CHOLESTEROL: 166 mg/dL (ref 0–200)
HDL: 65.9 mg/dL (ref >39.00)
LDL Cholesterol: 94 mg/dL (ref 0–99)
NonHDL: 99.69
TRIGLYCERIDES: 27 mg/dL (ref 0.0–149.0)
Total CHOL/HDL Ratio: 3
VLDL: 5.4 mg/dL (ref 0.0–40.0)

## 2015-10-14 LAB — POCT URINALYSIS DIPSTICK
Bilirubin, UA: NEGATIVE
Blood, UA: NEGATIVE
Glucose, UA: NEGATIVE
Ketones, UA: NEGATIVE
LEUKOCYTES UA: NEGATIVE
Nitrite, UA: NEGATIVE
PROTEIN UA: NEGATIVE
Spec Grav, UA: 1.025
UROBILINOGEN UA: 0.2
pH, UA: 5.5

## 2015-10-14 LAB — HEPATIC FUNCTION PANEL
ALBUMIN: 4.2 g/dL (ref 3.5–5.2)
ALT: 27 U/L (ref 0–35)
AST: 23 U/L (ref 0–37)
Alkaline Phosphatase: 112 U/L (ref 39–117)
Bilirubin, Direct: 0.1 mg/dL (ref 0.0–0.3)
Total Bilirubin: 0.4 mg/dL (ref 0.2–1.2)
Total Protein: 6.9 g/dL (ref 6.0–8.3)

## 2015-10-14 LAB — TSH: TSH: 1.24 u[IU]/mL (ref 0.35–4.50)

## 2015-10-18 ENCOUNTER — Ambulatory Visit (INDEPENDENT_AMBULATORY_CARE_PROVIDER_SITE_OTHER): Payer: BLUE CROSS/BLUE SHIELD | Admitting: Internal Medicine

## 2015-10-18 ENCOUNTER — Encounter: Payer: Self-pay | Admitting: Internal Medicine

## 2015-10-18 VITALS — BP 110/72 | HR 80 | Temp 98.3°F | Resp 20 | Ht 64.0 in | Wt 220.0 lb

## 2015-10-18 DIAGNOSIS — K219 Gastro-esophageal reflux disease without esophagitis: Secondary | ICD-10-CM

## 2015-10-18 DIAGNOSIS — Z23 Encounter for immunization: Secondary | ICD-10-CM | POA: Diagnosis not present

## 2015-10-18 DIAGNOSIS — Z0000 Encounter for general adult medical examination without abnormal findings: Secondary | ICD-10-CM

## 2015-10-18 DIAGNOSIS — I10 Essential (primary) hypertension: Secondary | ICD-10-CM

## 2015-10-18 MED ORDER — PHENTERMINE HCL 30 MG PO CAPS: 30.0000 mg | ORAL_CAPSULE | ORAL | Status: DC

## 2015-10-18 NOTE — Progress Notes (Signed)
Pre visit review using our clinic review tool, if applicable. No additional management support is needed unless otherwise documented below in the visit note. 

## 2015-10-18 NOTE — Progress Notes (Signed)
Subjective:    Patient ID: Hailey [name], female    DOB: 08/08/1961, 55 y.o.   MRN: [id number]  HPI 55 year-old patient who is seen today for a wellness exam  .  She has a history of treated hypertension. She has a history also of exogenous obesity but has lost approximately 40 pounds in the past.   today she is on a diet and regular exercise regimen. Her blood pressure has been well-controlled. No new concerns or complaints. She does have a annual gynecologic evaluation and had a screening colonoscopy 4 years ago.  She continues to exercise regular with a personal trainer  She has sinus surgery with Dr. Haroldine Laws for a deviated septum in September 2015  BP Readings from Last 3 Encounters:  10/18/15 110/72  10/15/14 112/74  03/13/14 136/88    Wt Readings from Last 3 Encounters:  10/18/15 220 lb (99.791 kg)  10/15/14 210 lb (95.255 kg)  03/13/14 212 lb (96.163 kg)   Past Medical History  Diagnosis Date  . CARPAL TUNNEL SYNDROME, BILATERAL 08/04/2010  . GERD 03/06/2008  . HYPERTENSION 02/10/2007  . OBESITY NOS 02/10/2007  . Obesity     Social History   Social History  . Marital Status: Married    Spouse Name: N/A  . Number of Children: N/A  . Years of Education: N/A   Occupational History  . Not on file.   Social History Main Topics  . Smoking status: Never Smoker   . Smokeless tobacco: Never Used  . Alcohol Use: Yes     Comment: rarley  . Drug Use: No  . Sexual Activity: Not on file   Other Topics Concern  . Not on file   Social History Narrative    Past Surgical History  Procedure Laterality Date  . Nasal sinus surgery    . Hemorrhoid surgery      No family history on file.  Allergies  Allergen Reactions  . Sulfamethoxazole     REACTION: unspecified    Current Outpatient Prescriptions on File Prior to Visit  Medication Sig Dispense Refill  . benazepril (LOTENSIN) 20 MG tablet Take 1 tablet (20 mg total) by mouth daily. 90 tablet 5   No current  facility-administered medications on file prior to visit.    BP 110/72 mmHg  Pulse 80  Temp(Src) 98.3 F (36.8 C) (Oral)  Resp 20  Ht  (1.626 m)  Wt 220 lb (99.791 kg)  BMI 37.74 kg/m2  SpO2 97%      Review of Systems  Constitutional: Negative for fever, appetite change, fatigue and unexpected weight change.  HENT: Negative for congestion, dental problem, ear pain, hearing loss, mouth sores, nosebleeds, sinus pressure, sore throat, tinnitus, trouble swallowing and voice change.   Eyes: Negative for photophobia, pain, redness and visual disturbance.  Respiratory: Negative for cough, chest tightness and shortness of breath.   Cardiovascular: Negative for chest pain, palpitations and leg swelling.  Gastrointestinal: Negative for nausea, vomiting, abdominal pain, diarrhea, constipation, blood in stool, abdominal distention and rectal pain.  Genitourinary: Negative for dysuria, urgency, frequency, hematuria, flank pain, vaginal bleeding, vaginal discharge, difficulty urinating, genital sores, vaginal pain, menstrual problem and pelvic pain.  Musculoskeletal: Negative for back pain, arthralgias and neck stiffness.  Skin: Negative for rash.  Neurological: Negative for dizziness, syncope, speech difficulty, weakness, light-headedness, numbness and headaches.  Hematological: Negative for adenopathy. Does not bruise/bleed easily.  Psychiatric/Behavioral: Negative for suicidal ideas, behavioral problems, self-injury, dysphoric mood and agitation. The patient is  not nervous/anxious.        Objective:   Physical Exam  Constitutional: She is oriented to person, place, and time. She appears well-developed and well-nourished.  Weight 218 Blood pressure low normal  HENT:  Head: Normocephalic and atraumatic.  Right Ear: External ear normal.  Left Ear: External ear normal.  Mouth/Throat: Oropharynx is clear and moist.  Pharyngeal crowding  Eyes: Conjunctivae and EOM are normal.  Neck:  Normal range of motion. Neck supple. No JVD present. No thyromegaly present.  Cardiovascular: Normal rate, regular rhythm, normal heart sounds and intact distal pulses.   No murmur heard. Pulmonary/Chest: Effort normal and breath sounds normal. She has no wheezes. She has no rales.  Abdominal: Soft. Bowel sounds are normal. She exhibits no distension and no mass. There is no tenderness. There is no rebound and no guarding.  Musculoskeletal: Normal range of motion. She exhibits no edema or tenderness.  Neurological: She is alert and oriented to person, place, and time. She has normal reflexes. No cranial nerve deficit. She exhibits normal muscle tone. Coordination normal.  Skin: Skin is warm and dry. No rash noted.  Psychiatric: She has a normal mood and affect. Her behavior is normal.          Assessment & Plan:  Preventive health examination Hypertension well controlled.  Discontinue hydrochlorothiazide and continue benazepril only Exogenous obesity.  Patient states her goal is sub 200 pounds prior to her next visit GERD stable  Recheck 1 year

## 2015-10-18 NOTE — Patient Instructions (Signed)
Limit your sodium (Salt) intake  Please check your blood pressure on a regular basis.  If it is consistently greater than 150/90, please make an office appointment.    It is important that you exercise regularly, at least 20 minutes 3 to 4 times per week.  If you develop chest pain or shortness of breath seek  medical attention.  You need to lose weight.  Consider a lower calorie diet and regular exercise.  Menopause is a normal process in which your reproductive ability comes to an end. This process happens gradually over a span of months to years, usually between the ages of 71 and 4. Menopause is complete when you have missed 12 consecutive menstrual periods. It is important to talk with your health care provider about some of the most common conditions that affect postmenopausal women, such as heart disease, cancer, and bone loss (osteoporosis). Adopting a healthy lifestyle and getting preventive care can help to promote your health and wellness. Those actions can also lower your chances of developing some of these common conditions. WHAT SHOULD I KNOW ABOUT MENOPAUSE? During menopause, you may experience a number of symptoms, such as:  Moderate-to-severe hot flashes.  Night sweats.  Decrease in sex drive.  Mood swings.  Headaches.  Tiredness.  Irritability.  Memory problems.  Insomnia. Choosing to treat or not to treat menopausal changes is an individual decision that you make with your health care provider. WHAT SHOULD I KNOW ABOUT HORMONE REPLACEMENT THERAPY AND SUPPLEMENTS? Hormone therapy products are effective for treating symptoms that are associated with menopause, such as hot flashes and night sweats. Hormone replacement carries certain risks, especially as you become older. If you are thinking about using estrogen or estrogen with progestin treatments, discuss the benefits and risks with your health care provider. WHAT SHOULD I KNOW ABOUT HEART DISEASE AND  STROKE? Heart disease, heart attack, and stroke become more likely as you age. This may be due, in part, to the hormonal changes that your body experiences during menopause. These can affect how your body processes dietary fats, triglycerides, and cholesterol. Heart attack and stroke are both medical emergencies. There are many things that you can do to help prevent heart disease and stroke:  Have your blood pressure checked at least every 1-2 years. High blood pressure causes heart disease and increases the risk of stroke.  If you are 67-51 years old, ask your health care provider if you should take aspirin to prevent a heart attack or a stroke.  Do not use any tobacco products, including cigarettes, chewing tobacco, or electronic cigarettes. If you need help quitting, ask your health care provider.  It is important to eat a healthy diet and maintain a healthy weight.  Be sure to include plenty of vegetables, fruits, low-fat dairy products, and lean protein.  Avoid eating foods that are high in solid fats, added sugars, or salt (sodium).  Get regular exercise. This is one of the most important things that you can do for your health.  Try to exercise for at least 150 minutes each week. The type of exercise that you do should increase your heart rate and make you sweat. This is known as moderate-intensity exercise.  Try to do strengthening exercises at least twice each week. Do these in addition to the moderate-intensity exercise.  Know your numbers.Ask your health care provider to check your cholesterol and your blood glucose. Continue to have your blood tested as directed by your health care provider. WHAT SHOULD I  KNOW ABOUT CANCER SCREENING? There are several types of cancer. Take the following steps to reduce your risk and to catch any cancer development as early as possible. Breast Cancer  Practice breast self-awareness.  This means understanding how your breasts normally appear  and feel.  It also means doing regular breast self-exams. Let your health care provider know about any changes, no matter how small.  If you are 27 or older, have a clinician do a breast exam (clinical breast exam or CBE) every year. Depending on your age, family history, and medical history, it may be recommended that you also have a yearly breast X-ray (mammogram).  If you have a family history of breast cancer, talk with your health care provider about genetic screening.  If you are at high risk for breast cancer, talk with your health care provider about having an MRI and a mammogram every year.  Breast cancer (BRCA) gene test is recommended for women who have family members with BRCA-related cancers. Results of the assessment will determine the need for genetic counseling and BRCA1 and for BRCA2 testing. BRCA-related cancers include these types:  Breast. This occurs in males or females.  Ovarian.  Tubal. This may also be called fallopian tube cancer.  Cancer of the abdominal or pelvic lining (peritoneal cancer).  Prostate.  Pancreatic. Cervical, Uterine, and Ovarian Cancer Your health care provider may recommend that you be screened regularly for cancer of the pelvic organs. These include your ovaries, uterus, and vagina. This screening involves a pelvic exam, which includes checking for microscopic changes to the surface of your cervix (Pap test).  For women ages 21-65, health care providers may recommend a pelvic exam and a Pap test every three years. For women ages 66-65, they may recommend the Pap test and pelvic exam, combined with testing for human papilloma virus (HPV), every five years. Some types of HPV increase your risk of cervical cancer. Testing for HPV may also be done on women of any age who have unclear Pap test results.  Other health care providers may not recommend any screening for nonpregnant women who are considered low risk for pelvic cancer and have no  symptoms. Ask your health care provider if a screening pelvic exam is right for you.  If you have had past treatment for cervical cancer or a condition that could lead to cancer, you need Pap tests and screening for cancer for at least 20 years after your treatment. If Pap tests have been discontinued for you, your risk factors (such as having a new sexual partner) need to be reassessed to determine if you should start having screenings again. Some women have medical problems that increase the chance of getting cervical cancer. In these cases, your health care provider may recommend that you have screening and Pap tests more often.  If you have a family history of uterine cancer or ovarian cancer, talk with your health care provider about genetic screening.  If you have vaginal bleeding after reaching menopause, tell your health care provider.  There are currently no reliable tests available to screen for ovarian cancer. Lung Cancer Lung cancer screening is recommended for adults 74-82 years old who are at high risk for lung cancer because of a history of smoking. A yearly low-dose CT scan of the lungs is recommended if you:  Currently smoke.  Have a history of at least 30 pack-years of smoking and you currently smoke or have quit within the past 15 years. A pack-year is  smoking an average of one pack of cigarettes per day for one year. Yearly screening should:  Continue until it has been 15 years since you quit.  Stop if you develop a health problem that would prevent you from having lung cancer treatment. Colorectal Cancer  This type of cancer can be detected and can often be prevented.  Routine colorectal cancer screening usually begins at age 3 and continues through age 4.  If you have risk factors for colon cancer, your health care provider may recommend that you be screened at an earlier age.  If you have a family history of colorectal cancer, talk with your health care provider  about genetic screening.  Your health care provider may also recommend using home test kits to check for hidden blood in your stool.  A small camera at the end of a tube can be used to examine your colon directly (sigmoidoscopy or colonoscopy). This is done to check for the earliest forms of colorectal cancer.  Direct examination of the colon should be repeated every 5-10 years until age 81. However, if early forms of precancerous polyps or small growths are found or if you have a family history or genetic risk for colorectal cancer, you may need to be screened more often. Skin Cancer  Check your skin from head to toe regularly.  Monitor any moles. Be sure to tell your health care provider:  About any new moles or changes in moles, especially if there is a change in a mole's shape or color.  If you have a mole that is larger than the size of a pencil eraser.  If any of your family members has a history of skin cancer, especially at a young age, talk with your health care provider about genetic screening.  Always use sunscreen. Apply sunscreen liberally and repeatedly throughout the day.  Whenever you are outside, protect yourself by wearing long sleeves, pants, a wide-brimmed hat, and sunglasses. WHAT SHOULD I KNOW ABOUT OSTEOPOROSIS? Osteoporosis is a condition in which bone destruction happens more quickly than new bone creation. After menopause, you may be at an increased risk for osteoporosis. To help prevent osteoporosis or the bone fractures that can happen because of osteoporosis, the following is recommended:  If you are 57-84 years old, get at least 1,000 mg of calcium and at least 600 mg of vitamin D per day.  If you are older than age 27 but younger than age 32, get at least 1,200 mg of calcium and at least 600 mg of vitamin D per day.  If you are older than age 86, get at least 1,200 mg of calcium and at least 800 mg of vitamin D per day. Smoking and excessive alcohol intake  increase the risk of osteoporosis. Eat foods that are rich in calcium and vitamin D, and do weight-bearing exercises several times each week as directed by your health care provider. WHAT SHOULD I KNOW ABOUT HOW MENOPAUSE AFFECTS MY MENTAL HEALTH? Depression may occur at any age, but it is more common as you become older. Common symptoms of depression include:  Low or sad mood.  Changes in sleep patterns.  Changes in appetite or eating patterns.  Feeling an overall lack of motivation or enjoyment of activities that you previously enjoyed.  Frequent crying spells. Talk with your health care provider if you think that you are experiencing depression. WHAT SHOULD I KNOW ABOUT IMMUNIZATIONS? It is important that you get and maintain your immunizations. These include:  Tetanus,  diphtheria, and pertussis (Tdap) booster vaccine.  Influenza every year before the flu season begins.  Pneumonia vaccine.  Shingles vaccine. Your health care provider may also recommend other immunizations.   This information is not intended to replace advice given to you by your health care provider. Make sure you discuss any questions you have with your health care provider.   Document Released: 11/06/2005 Document Revised: 10/05/2014 Document Reviewed: 05/17/2014 Elsevier Interactive Patient Education [employer].

## 2015-10-18 NOTE — Addendum Note (Signed)
Addended by: Jimmye Norman on: 10/18/2015 04:16 PM   Modules accepted: Orders

## 2015-11-11 ENCOUNTER — Other Ambulatory Visit: Payer: Self-pay | Admitting: Internal Medicine

## 2015-11-24 ENCOUNTER — Other Ambulatory Visit: Payer: Self-pay | Admitting: Internal Medicine

## 2015-11-27 ENCOUNTER — Telehealth: Payer: Self-pay | Admitting: Internal Medicine

## 2015-11-27 MED ORDER — PHENTERMINE HCL 30 MG PO CAPS: 30.0000 mg | ORAL_CAPSULE | Freq: Every morning | ORAL | Status: DC

## 2015-11-27 NOTE — Telephone Encounter (Signed)
[facility] could not understand how many of the phentermine 30 MG capsule  You want to dispense on the voice mail. Please call back.  [facility] [address]

## 2015-11-27 NOTE — Telephone Encounter (Signed)
Tried to contact pharmacy, number is not working at present will fax Rx over.

## 2016-06-16 ENCOUNTER — Other Ambulatory Visit: Payer: Self-pay | Admitting: Internal Medicine

## 2016-06-17 NOTE — Telephone Encounter (Signed)
Dr.K, okay to refill Phentermine 30 mg capsule, last seen 09/2015.

## 2016-06-17 NOTE — Telephone Encounter (Signed)
Okay to refill? 

## 2016-10-26 ENCOUNTER — Other Ambulatory Visit: Payer: Self-pay | Admitting: Internal Medicine

## 2016-10-30 ENCOUNTER — Other Ambulatory Visit: Payer: Self-pay | Admitting: Internal Medicine

## 2016-12-09 ENCOUNTER — Telehealth: Payer: Self-pay | Admitting: Internal Medicine

## 2016-12-09 NOTE — Telephone Encounter (Signed)
Pt can not come in until around 330 pm due to work. Can I create 30 min slot for physical. Pt is aware md out of office for 2 wks.

## 2016-12-10 NOTE — Telephone Encounter (Signed)
Yes ok to make 30 min appt

## 2016-12-10 NOTE — Telephone Encounter (Signed)
Noted! Thank you

## 2016-12-17 NOTE — Telephone Encounter (Signed)
Pt has been sch

## 2016-12-28 ENCOUNTER — Encounter: Payer: Self-pay | Admitting: Podiatry

## 2016-12-28 ENCOUNTER — Ambulatory Visit (INDEPENDENT_AMBULATORY_CARE_PROVIDER_SITE_OTHER): Payer: 59 | Admitting: Podiatry

## 2016-12-28 VITALS — BP 132/83 | HR 75 | Resp 18

## 2016-12-28 DIAGNOSIS — L84 Corns and callosities: Secondary | ICD-10-CM | POA: Diagnosis not present

## 2016-12-28 DIAGNOSIS — M205X9 Other deformities of toe(s) (acquired), unspecified foot: Secondary | ICD-10-CM

## 2016-12-28 NOTE — Progress Notes (Signed)
   Subjective:    Patient ID: Hailey [name], female    DOB: November 26, 1960, 56 y.o.   MRN: [id number]  HPI  56 year old female presents the office today for concerns of calluses and corns to both of her feet which ongoing for several years. She typically gets a pedicure but the person typically does a states that she see a doctor for this. She says the areas are painful with pressure in certain shoes. She denies any redness or drainage or any swelling. She's had no other treatment. No other complaints at this time.   Review of Systems  HENT: Positive for sinus pressure.   All other systems reviewed and are negative.      Objective:   Physical Exam General: AAO x3, NAD  Dermatological: Hyperkeratotic lesions present bilateral fifth digits as well as distal second toe as well as medial hallux bilaterally. Upon debridement there is no underlying ulceration, drainage or any signs of infection.  Vascular: Dorsalis Pedis artery and Posterior Tibial artery pedal pulses are 2/4 bilateral with immedate capillary fill time.  There is no pain with calf compression, swelling, warmth, erythema.   Neruologic: Grossly intact via light touch bilateral. Vibratory intact via tuning fork bilateral. Protective threshold with Semmes Wienstein monofilament intact to all pedal sites bilateral.   Musculoskeletal: Hammertoes are present.  Muscular strength 5/5 in all groups tested bilateral.  Gait: Unassisted, Nonantalgic.      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 -Treatment options discussed including all alternatives, risks, and complications -Etiology of symptoms were discussed -Hyperkeratotic lesions were sharply debrided 5 without complications or bleeding. Discussed likely reoccurrence. It is since multiple offloading pads to help take pressure off this area. Also discussed the change in shoes. Discussed also possible  surgical intervention the future if desired and needed.  -RTC as needed. Call with any questions or concerns.   Ovid Curd, DPM

## 2016-12-29 ENCOUNTER — Encounter: Payer: Self-pay | Admitting: Internal Medicine

## 2016-12-29 ENCOUNTER — Ambulatory Visit (INDEPENDENT_AMBULATORY_CARE_PROVIDER_SITE_OTHER): Payer: 59 | Admitting: Internal Medicine

## 2016-12-29 VITALS — BP 122/68 | Ht 65.0 in | Wt 215.6 lb

## 2016-12-29 DIAGNOSIS — Z0000 Encounter for general adult medical examination without abnormal findings: Secondary | ICD-10-CM | POA: Diagnosis not present

## 2016-12-29 LAB — CBC WITH DIFFERENTIAL/PLATELET
BASOS ABS: 0.1 10*3/uL (ref 0.0–0.1)
Basophils Relative: 1.1 % (ref 0.0–3.0)
Eosinophils Absolute: 0.1 10*3/uL (ref 0.0–0.7)
Eosinophils Relative: 2.8 % (ref 0.0–5.0)
HEMATOCRIT: 36.8 % (ref 36.0–46.0)
HEMOGLOBIN: 12.4 g/dL (ref 12.0–15.0)
LYMPHS PCT: 37.1 % (ref 12.0–46.0)
Lymphs Abs: 1.7 10*3/uL (ref 0.7–4.0)
MCHC: 33.8 g/dL (ref 30.0–36.0)
MCV: 91.1 fl (ref 78.0–100.0)
MONOS PCT: 9.8 % (ref 3.0–12.0)
Monocytes Absolute: 0.5 10*3/uL (ref 0.1–1.0)
NEUTROS PCT: 49.2 % (ref 43.0–77.0)
Neutro Abs: 2.3 10*3/uL (ref 1.4–7.7)
Platelets: 314 10*3/uL (ref 150.0–400.0)
RBC: 4.04 Mil/uL (ref 3.87–5.11)
RDW: 13.3 % (ref 11.5–15.5)
WBC: 4.6 10*3/uL (ref 4.0–10.5)

## 2016-12-29 LAB — COMPREHENSIVE METABOLIC PANEL
ALK PHOS: 90 U/L (ref 39–117)
ALT: 12 U/L (ref 0–35)
AST: 15 U/L (ref 0–37)
Albumin: 4.1 g/dL (ref 3.5–5.2)
BILIRUBIN TOTAL: 0.4 mg/dL (ref 0.2–1.2)
BUN: 12 mg/dL (ref 6–23)
CALCIUM: 9.3 mg/dL (ref 8.4–10.5)
CO2: 32 mEq/L (ref 19–32)
Chloride: 106 mEq/L (ref 96–112)
Creatinine, Ser: 0.66 mg/dL (ref 0.40–1.20)
GFR: 119.18 mL/min (ref >60.00)
Glucose, Bld: 83 mg/dL (ref 70–99)
Potassium: 4.3 mEq/L (ref 3.5–5.1)
Sodium: 142 mEq/L (ref 135–145)
TOTAL PROTEIN: 6.6 g/dL (ref 6.0–8.3)

## 2016-12-29 LAB — LIPID PANEL
Cholesterol: 158 mg/dL (ref 0–200)
HDL: 60.6 mg/dL (ref >39.00)
LDL Cholesterol: 92 mg/dL (ref 0–99)
NonHDL: 97.51
TRIGLYCERIDES: 30 mg/dL (ref 0.0–149.0)
Total CHOL/HDL Ratio: 3
VLDL: 6 mg/dL (ref 0.0–40.0)

## 2016-12-29 LAB — TSH: TSH: 1.49 u[IU]/mL (ref 0.35–4.50)

## 2016-12-29 NOTE — Progress Notes (Signed)
Pre visit review using our clinic review tool, if applicable. No additional management support is needed unless otherwise documented below in the visit note. 

## 2016-12-29 NOTE — Patient Instructions (Addendum)
Health Maintenance for Postmenopausal Women Menopause is a normal process in which your reproductive ability comes to an end. This process happens gradually over a span of months to years, usually between the ages of 33 and 38. Menopause is complete when you have missed 12 consecutive menstrual periods. It is important to talk with your health care provider about some of the most common conditions that affect postmenopausal women, such as heart disease, cancer, and bone loss (osteoporosis). Adopting a healthy lifestyle and getting preventive care can help to promote your health and wellness. Those actions can also lower your chances of developing some of these common conditions. What should I know about menopause? During menopause, you may experience a number of symptoms, such as:  Moderate-to-severe hot flashes.  Night sweats.  Decrease in sex drive.  Mood swings.  Headaches.  Tiredness.  Irritability.  Memory problems.  Insomnia. Choosing to treat or not to treat menopausal changes is an individual decision that you make with your health care provider. What should I know about hormone replacement therapy and supplements? Hormone therapy products are effective for treating symptoms that are associated with menopause, such as hot flashes and night sweats. Hormone replacement carries certain risks, especially as you become older. If you are thinking about using estrogen or estrogen with progestin treatments, discuss the benefits and risks with your health care provider. What should I know about heart disease and stroke? Heart disease, heart attack, and stroke become more likely as you age. This may be due, in part, to the hormonal changes that your body experiences during menopause. These can affect how your body processes dietary fats, triglycerides, and cholesterol. Heart attack and stroke are both medical emergencies. There are many things that you can do to help prevent heart disease  and stroke:  Have your blood pressure checked at least every 1-2 years. High blood pressure causes heart disease and increases the risk of stroke.  If you are 48-61 years old, ask your health care provider if you should take aspirin to prevent a heart attack or a stroke.  Do not use any tobacco products, including cigarettes, chewing tobacco, or electronic cigarettes. If you need help quitting, ask your health care provider.  It is important to eat a healthy diet and maintain a healthy weight.  Be sure to include plenty of vegetables, fruits, low-fat dairy products, and lean protein.  Avoid eating foods that are high in solid fats, added sugars, or salt (sodium).  Get regular exercise. This is one of the most important things that you can do for your health.  Try to exercise for at least 150 minutes each week. The type of exercise that you do should increase your heart rate and make you sweat. This is known as moderate-intensity exercise.  Try to do strengthening exercises at least twice each week. Do these in addition to the moderate-intensity exercise.  Know your numbers.Ask your health care provider to check your cholesterol and your blood glucose. Continue to have your blood tested as directed by your health care provider. What should I know about cancer screening? There are several types of cancer. Take the following steps to reduce your risk and to catch any cancer development as early as possible. Breast Cancer  Practice breast self-awareness.  This means understanding how your breasts normally appear and feel.  It also means doing regular breast self-exams. Let your health care provider know about any changes, no matter how small.  If you are 40 or older,  have a clinician do a breast exam (clinical breast exam or CBE) every year. Depending on your age, family history, and medical history, it may be recommended that you also have a yearly breast X-ray (mammogram).  If you  have a family history of breast cancer, talk with your health care provider about genetic screening.  If you are at high risk for breast cancer, talk with your health care provider about having an MRI and a mammogram every year.  Breast cancer (BRCA) gene test is recommended for women who have family members with BRCA-related cancers. Results of the assessment will determine the need for genetic counseling and BRCA1 and for BRCA2 testing. BRCA-related cancers include these types:  Breast. This occurs in males or females.  Ovarian.  Tubal. This may also be called fallopian tube cancer.  Cancer of the abdominal or pelvic lining (peritoneal cancer).  Prostate.  Pancreatic. Cervical, Uterine, and Ovarian Cancer  Your health care provider may recommend that you be screened regularly for cancer of the pelvic organs. These include your ovaries, uterus, and vagina. This screening involves a pelvic exam, which includes checking for microscopic changes to the surface of your cervix (Pap test).  For women ages 21-65, health care providers may recommend a pelvic exam and a Pap test every three years. For women ages 23-65, they may recommend the Pap test and pelvic exam, combined with testing for human papilloma virus (HPV), every five years. Some types of HPV increase your risk of cervical cancer. Testing for HPV may also be done on women of any age who have unclear Pap test results.  Other health care providers may not recommend any screening for nonpregnant women who are considered low risk for pelvic cancer and have no symptoms. Ask your health care provider if a screening pelvic exam is right for you.  If you have had past treatment for cervical cancer or a condition that could lead to cancer, you need Pap tests and screening for cancer for at least 20 years after your treatment. If Pap tests have been discontinued for you, your risk factors (such as having a new sexual partner) need to be reassessed  to determine if you should start having screenings again. Some women have medical problems that increase the chance of getting cervical cancer. In these cases, your health care provider may recommend that you have screening and Pap tests more often.  If you have a family history of uterine cancer or ovarian cancer, talk with your health care provider about genetic screening.  If you have vaginal bleeding after reaching menopause, tell your health care provider.  There are currently no reliable tests available to screen for ovarian cancer. Lung Cancer  Lung cancer screening is recommended for adults 99-83 years old who are at high risk for lung cancer because of a history of smoking. A yearly low-dose CT scan of the lungs is recommended if you:  Currently smoke.  Have a history of at least 30 pack-years of smoking and you currently smoke or have quit within the past 15 years. A pack-year is smoking an average of one pack of cigarettes per day for one year. Yearly screening should:  Continue until it has been 15 years since you quit.  Stop if you develop a health problem that would prevent you from having lung cancer treatment. Colorectal Cancer  This type of cancer can be detected and can often be prevented.  Routine colorectal cancer screening usually begins at age 72 and continues  through age 16.  If you have risk factors for colon cancer, your health care provider may recommend that you be screened at an earlier age.  If you have a family history of colorectal cancer, talk with your health care provider about genetic screening.  Your health care provider may also recommend using home test kits to check for hidden blood in your stool.  A small camera at the end of a tube can be used to examine your colon directly (sigmoidoscopy or colonoscopy). This is done to check for the earliest forms of colorectal cancer.  Direct examination of the colon should be repeated every 5-10 years until  age 46. However, if early forms of precancerous polyps or small growths are found or if you have a family history or genetic risk for colorectal cancer, you may need to be screened more often. Skin Cancer  Check your skin from head to toe regularly.  Monitor any moles. Be sure to tell your health care provider:  About any new moles or changes in moles, especially if there is a change in a mole's shape or color.  If you have a mole that is larger than the size of a pencil eraser.  If any of your family members has a history of skin cancer, especially at a young age, talk with your health care provider about genetic screening.  Always use sunscreen. Apply sunscreen liberally and repeatedly throughout the day.  Whenever you are outside, protect yourself by wearing long sleeves, pants, a wide-brimmed hat, and sunglasses. What should I know about osteoporosis? Osteoporosis is a condition in which bone destruction happens more quickly than new bone creation. After menopause, you may be at an increased risk for osteoporosis. To help prevent osteoporosis or the bone fractures that can happen because of osteoporosis, the following is recommended:  If you are 52-2 years old, get at least 1,000 mg of calcium and at least 600 mg of vitamin D per day.  If you are older than age 12 but younger than age 75, get at least 1,200 mg of calcium and at least 600 mg of vitamin D per day.  If you are older than age 43, get at least 1,200 mg of calcium and at least 800 mg of vitamin D per day. Smoking and excessive alcohol intake increase the risk of osteoporosis. Eat foods that are rich in calcium and vitamin D, and do weight-bearing exercises several times each week as directed by your health care provider. What should I know about how menopause affects my mental health? Depression may occur at any age, but it is more common as you become older. Common symptoms of depression include:  Low or sad  mood.  Changes in sleep patterns.  Changes in appetite or eating patterns.  Feeling an overall lack of motivation or enjoyment of activities that you previously enjoyed.  Frequent crying spells. Talk with your health care provider if you think that you are experiencing depression. What should I know about immunizations? It is important that you get and maintain your immunizations. These include:  Tetanus, diphtheria, and pertussis (Tdap) booster vaccine.  Influenza every year before the flu season begins.  Pneumonia vaccine.  Shingles vaccine. Your health care provider may also recommend other immunizations. This information is not intended to replace advice given to you by your health care provider. Make sure you discuss any questions you have with your health care provider.   Limit your sodium (Salt) intake  Please check your blood  pressure on a regular basis.  If it is consistently greater than 150/90, please make an office appointment.    It is important that you exercise regularly, at least 20 minutes 3 to 4 times per week.  If you develop chest pain or shortness of breath seek  medical attention.  You need to lose weight.  Consider a lower calorie diet and regular exercise.  Take a calcium supplement, plus [phone] units of vitamin D

## 2016-12-29 NOTE — Progress Notes (Signed)
Subjective:    Patient ID: Hailey [name], female    DOB: Nov 02, 1960, 56 y.o.   MRN: [id number]  HPI  56  year-old patient who is seen today for a wellness exam  .  She has a history of treated hypertension. She has a history also of exogenous obesity but has lost approximately 40 pounds in the past.   today she is on a diet and regular exercise regimen. Her blood pressure has been well-controlled. No new concerns or complaints. She does have a annual gynecologic evaluation and had a screening colonoscopy 4 years ago.  She continues to exercise regular with a personal trainer  She has sinus surgery with Dr. Haroldine Laws for a deviated septum in September 2015  Wt Readings from Last 3 Encounters:  12/29/16 215 lb 9.6 oz (97.8 kg)  10/18/15 220 lb (99.8 kg)  10/15/14 210 lb (95.3 kg)    Past Medical History:  Diagnosis Date  . CARPAL TUNNEL SYNDROME, BILATERAL 08/04/2010  . GERD 03/06/2008  . HYPERTENSION 02/10/2007  . Obesity   . OBESITY NOS 02/10/2007     Social History   Social History  . Marital status: Married    Spouse name: N/A  . Number of children: N/A  . Years of education: N/A   Occupational History  . Not on file.   Social History Main Topics  . Smoking status: Never Smoker  . Smokeless tobacco: Never Used  . Alcohol use Yes     Comment: rarley  . Drug use: No  . Sexual activity: Not on file   Other Topics Concern  . Not on file   Social History Narrative  . No narrative on file    Past Surgical History:  Procedure Laterality Date  . HEMORRHOID SURGERY    . NASAL SINUS SURGERY      No family history on file.  Allergies  Allergen Reactions  . Sulfamethoxazole     REACTION: unspecified    Current Outpatient Prescriptions on File Prior to Visit  Medication Sig Dispense Refill  . benazepril (LOTENSIN) 20 MG tablet TAKE 1 TABLET (20 MG TOTAL) BY MOUTH DAILY. (Patient not taking: Reported on 12/28/2016) 90 tablet 4  . benazepril (LOTENSIN) 20 MG  tablet TAKE 1 TABLET (20 MG TOTAL) BY MOUTH DAILY. 90 tablet 3  . phentermine 30 MG capsule TAKE 1 CAPSULE BY MOUTH EVERY MORNING 30 capsule 1   No current facility-administered medications on file prior to visit.     BP 122/68 (BP Location: Left Arm, Patient Position: Sitting, Cuff Size: Normal)   Ht  (1.651 m)   Wt 215 lb 9.6 oz (97.8 kg)   BMI 35.88 kg/m     Review of Systems  Constitutional: Negative.   HENT: Negative for congestion, dental problem, hearing loss, rhinorrhea, sinus pressure, sore throat and tinnitus.   Eyes: Negative for pain, discharge and visual disturbance.  Respiratory: Negative for cough and shortness of breath.   Cardiovascular: Negative for chest pain, palpitations and leg swelling.  Gastrointestinal: Negative for abdominal distention, abdominal pain, blood in stool, constipation, diarrhea, nausea and vomiting.  Genitourinary: Negative for difficulty urinating, dysuria, flank pain, frequency, hematuria, pelvic pain, urgency, vaginal bleeding, vaginal discharge and vaginal pain.  Musculoskeletal: Negative for arthralgias, gait problem and joint swelling.       Recent left carpal tunnel repair  Skin: Negative for rash.  Neurological: Negative for dizziness, syncope, speech difficulty, weakness, numbness and headaches.  Hematological: Negative for adenopathy.  Psychiatric/Behavioral:  Negative for agitation, behavioral problems and dysphoric mood. The patient is not nervous/anxious.        Objective:   Physical Exam  Constitutional: She is oriented to person, place, and time. She appears well-developed and well-nourished.  HENT:  Head: Normocephalic and atraumatic.  Right Ear: External ear normal.  Left Ear: External ear normal.  Mouth/Throat: Oropharynx is clear and moist.  Pharyngeal crowding  Eyes: Conjunctivae and EOM are normal.  Neck: Normal range of motion. Neck supple. No JVD present. No thyromegaly present.  Cardiovascular: Normal rate,  regular rhythm, normal heart sounds and intact distal pulses.   No murmur heard. Pulmonary/Chest: Effort normal and breath sounds normal. She has no wheezes. She has no rales.  Abdominal: Soft. Bowel sounds are normal. She exhibits no distension and no mass. There is no tenderness. There is no rebound and no guarding.  Musculoskeletal: Normal range of motion. She exhibits no edema or tenderness.  Neurological: She is alert and oriented to person, place, and time. She has normal reflexes. No cranial nerve deficit. She exhibits normal muscle tone. Coordination normal.  Skin: Skin is warm and dry. No rash noted.  Psychiatric: She has a normal mood and affect. Her behavior is normal.          Assessment & Plan:   Preventive health care Essential hypertension, well-controlled Exogenous obesity Status post carpal tunnel repair   We'll check updated labNo change in medical regimen Continue efforts at ongoing weight loss  Follow-up one year or as needed  Continue home blood pressure monitoring  Cecillia Menees Homero Fellers

## 2016-12-31 ENCOUNTER — Other Ambulatory Visit: Payer: Self-pay | Admitting: Internal Medicine

## 2017-06-17 ENCOUNTER — Encounter: Payer: Self-pay | Admitting: Internal Medicine

## 2017-07-22 ENCOUNTER — Ambulatory Visit (INDEPENDENT_AMBULATORY_CARE_PROVIDER_SITE_OTHER): Payer: 59 | Admitting: Internal Medicine

## 2017-07-22 ENCOUNTER — Encounter: Payer: Self-pay | Admitting: Internal Medicine

## 2017-07-22 ENCOUNTER — Other Ambulatory Visit: Payer: Self-pay | Admitting: Internal Medicine

## 2017-07-22 VITALS — BP 100/78 | HR 74 | Temp 98.2°F | Wt 226.6 lb

## 2017-07-22 DIAGNOSIS — M25562 Pain in left knee: Secondary | ICD-10-CM

## 2017-07-22 MED ORDER — PHENTERMINE HCL 30 MG PO CAPS: 30.0000 mg | ORAL_CAPSULE | Freq: Every morning | ORAL | 1 refills | Status: DC

## 2017-07-22 MED ORDER — PHENTERMINE HCL 30 MG PO CAPS: 30.0000 mg | ORAL_CAPSULE | Freq: Every morning | ORAL | 2 refills | Status: DC

## 2017-07-22 NOTE — Progress Notes (Signed)
Subjective:    Patient ID: Hailey [name], female    DOB: 04/10/1961, 56 y.o.   MRN: [id number]  HPI 56 year old patient who presents with a chief complaint of left medial knee pain.  This has been an intermittent problem for months and was a chief complaint at the time of her annual exam earlier this year.  She is very active with work and also during her leisure hours.  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  Past Medical History:  Diagnosis Date  . CARPAL TUNNEL SYNDROME, BILATERAL 08/04/2010  . GERD 03/06/2008  . HYPERTENSION 02/10/2007  . Obesity   . OBESITY NOS 02/10/2007     Social History   Social History  . Marital status: Married    Spouse name: N/A  . Number of children: N/A  . Years of education: N/A   Occupational History  . Not on file.   Social History Main Topics  . Smoking status: Never Smoker  . Smokeless tobacco: Never Used  . Alcohol use Yes     Comment: rarley  . Drug use: No  . Sexual activity: Not on file   Other Topics Concern  . Not on file   Social History Narrative  . No narrative on file    Past Surgical History:  Procedure Laterality Date  . HEMORRHOID SURGERY    . NASAL SINUS SURGERY      No family history on file.  Allergies  Allergen Reactions  . Sulfamethoxazole     REACTION: unspecified    Current Outpatient Prescriptions on File Prior to Visit  Medication Sig Dispense Refill  . benazepril (LOTENSIN) 20 MG tablet TAKE 1 TABLET (20 MG TOTAL) BY MOUTH DAILY. 90 tablet 4  . benazepril (LOTENSIN) 20 MG tablet TAKE 1 TABLET (20 MG TOTAL) BY MOUTH DAILY. 90 tablet 3   No current facility-administered medications on file prior to visit.     BP 100/78 (BP Location: Left Arm, Patient Position: Sitting, Cuff Size: Normal)   Pulse 74   Temp 98.2 F (36.8 C) (Oral)   Wt 226 lb 9.6 oz (102.8 kg)   SpO2 96%   BMI 37.71 kg/m      Review of Systems    Constitutional: Negative.   HENT: Negative for congestion, dental problem, hearing loss, rhinorrhea, sinus pressure, sore throat and tinnitus.   Eyes: Negative for pain, discharge and visual disturbance.  Respiratory: Negative for cough and shortness of breath.   Cardiovascular: Negative for chest pain, palpitations and leg swelling.  Gastrointestinal: Negative for abdominal distention, abdominal pain, blood in stool, constipation, diarrhea, nausea and vomiting.  Genitourinary: Negative for difficulty urinating, dysuria, flank pain, frequency, hematuria, pelvic pain, urgency, vaginal bleeding, vaginal discharge and vaginal pain.  Musculoskeletal: Negative for arthralgias, gait problem and joint swelling.       Left medial knee pain without swelling  Skin: Negative for rash.  Neurological: Negative for dizziness, syncope, speech difficulty, weakness, numbness and headaches.  Hematological: Negative for adenopathy.  Psychiatric/Behavioral: Negative for agitation, behavioral problems and dysphoric mood. The patient is not nervous/anxious.        Objective:   Physical Exam  Constitutional: She appears well-developed and well-nourished. No distress.  Musculoskeletal:  Left knee had a brace No soft tissue swelling or tenderness to palpation Full range of motion          Assessment & Plan:   Left medial knee pain.  Probable  strain/overuse of the left medial collateral ligament  She will attempt to moderate her activities and avoid Activities that aggravate the pain.  She was given stretching,/strengthening exercises for the medial collateral ligament.  Refer  to sports medicine.  If unimproved  Rogelia Boga

## 2017-07-22 NOTE — Patient Instructions (Addendum)
You  may move around, but avoid painful motions and activities.  Apply ice to the sore area for 15 to 20 minutes after vigorous activities.  Please call for an appointment for sports medicine referral if unimproved Medial Collateral Knee Ligament Sprain, Phase I Rehab Ask your health care provider which exercises are safe for you. Do exercises exactly as told by your health care provider and adjust them as directed. It is normal to feel mild stretching, pulling, tightness, or discomfort as you do these exercises, but you should stop right away if you feel sudden pain or your pain gets worse.Do not begin these exercises until told by your health care provider. Stretching and range of motion exercises These exercises warm up your muscles and joints and improve the movement and flexibility of your knee. These exercises also help to relieve pain, numbness, and tingling. Exercise A: Knee flexion, passive 1. Start this exercise in one of these positions: ? Lying on the floor in front of an open doorway, with your left / right heel and foot lightly touching the wall. ? Lying on the floor with both feet on the wall. 2. Without using any effort, allow gravity to let your foot slide down the wall slowly until you feel a gentle stretch in the front of your left / right knee. 3. Hold this stretch for __________ seconds. 4. Return your leg to the starting position, using your healthy leg to do the work or to help if needed. Repeat __________ times. Complete this stretch __________ times a day. Exercise B: Knee flexion, active  1. Lie on your back with both knees straight. If this causes back discomfort, bend your healthy knee so your foot is flat on the floor. 2. Slowly slide your left / right heel back toward your buttocks until you feel a gentle stretch in the front of your knee or thigh. 3. Hold this position for __________ seconds. 4. Slowly slide your left / right heel back to the starting  position. Repeat __________ times. Complete this exercise __________ times a day. Exercise C: Knee extension, sitting 1. Sit with your left / right heel propped on a chair, a coffee table, or a footstool. Do not have anything under your knee to support it. 2. Allow your leg muscles to relax, letting gravity straighten out your knee. Do not let your knee roll inward. You should feel a stretch behind your left / right knee. 3. If told by your health care provider, deepen the stretch by placing a __________ weight on your thigh, just above your kneecap. 4. Hold this position for __________ seconds. Repeat __________ times. Complete this stretch __________ times a day. Strengthening exercises These exercises build strength and endurance in your knee. Endurance is the ability to use your muscles for a long time, even after they get tired. Isometric exercises involve squeezing your muscles but not moving your knee. Exercise D: Quadriceps, isometric  1. Lie on your back with your left / right leg extended and your other knee bent. 2. If told by your health care provider, put a rolled towel or small pillow under your left / right knee. 3. Slowly tense the muscles in the front of your left / right thigh by pushing your knee down. You should see your kneecap slide up toward your hip or see increased dimpling just above the knee. 4. For __________ seconds, keep the muscle as tight as you can without increasing your pain. 5. Relax your muscles slowly and completely.  Repeat __________ times. Complete this exercise __________ times a day. Exercise E: Hamstring, isometric  1. Lie on your back on a firm surface. 2. Bend your left / right knee about __________ degrees. You can prop your knee on a pillow if needed. 3. Dig your heel down and back into the surface as if you are trying to pull your heel toward your buttocks. Tighten the muscles in the back of your thighs to "dig" as hard as you can without  increasing any pain. 4. Hold this position for __________ seconds. 5. Relax your muscles slowly and completely. Repeat __________ times. Complete this exercise __________ times a day. This information is not intended to replace advice given to you by your health care provider. Make sure you discuss any questions you have with your health care provider. Document Released: 09/14/2005 Document Revised: 05/21/2016 Document Reviewed: 07/27/2015 Elsevier Interactive Patient Education  2018 [employer]   Medial Collateral Knee Ligament Sprain, Phase II Rehab These exercises are more advanced than phase I exercises. Ask your health care provider which exercises are safe for you. Do exercises exactly as told by your health care provider and adjust them as directed. It is normal to feel mild stretching, pulling, tightness, or discomfort as you do these exercises, but you should stop right away if you feel sudden pain or your pain gets worse. Do not begin these exercises until told by your health care provider. Strengthening exercises These exercises build strength and endurance in your knee. Endurance is the ability to use your muscles for a long time, even after they get tired. Exercise A: Straight leg raises ( quadriceps) 1. Lie on your back with your left / right leg extended and your other knee bent. 2. Tense the muscles in the front of your left / right thigh. You should see your kneecap slide up or see increased dimpling just above the knee. 3. Keep your muscles tight as you raise your leg 4-6 inches (10-15 cm) off the floor. 4. Hold this position for __________ seconds. 5. Keeping the muscles tense as you lower your leg. 6. Relax the muscles slowly and completely. Repeat __________ times. Complete this exercise __________ times a day. Exercise B: Hamstring curls  If told by your health care provider, do this exercise while wearing ankle weights. Begin with __________ weights. Then increase  the weight by 1 lb (0.5 kg) increments. Do not wear ankle weights that are more than __________. 5. Lie on your abdomen with your legs straight. 6. Keeping your hips flat, bend your left / right knee as far as you comfortably can. 7. Hold this position for __________ seconds. 8. Slowly lower your leg to the starting position. Repeat __________ times. Complete this exercise __________ times a day. Exercise C: Wall slides ( quadriceps) 5. Lean your back against a smooth wall or door, and walk your feet out 18-24 inches (46-61 cm) from it. 6. Place your feet hip-width apart. 7. Slowly slide down the wall or door until your knees bend __________ degrees. Your knees should be over your ankles. Do not let your knees fall inward. 8. Hold for __________ seconds. 9. Stand up to rest for __________ seconds. Repeat __________ times. Complete this exercise __________ times a day. Exercise D: Straight leg raises ( hip abductors) 1. Lie on your side with your left / right leg in the top position. Lie so your head, shoulder, knee, and hip line up. You may bend your lower knee to help you keep  your balance. 2. Roll your hips slightly forward so your hips are stacked directly over each other and your left / right knee is facing forward. 3. Leading with your heel, lift your top leg 4-6 inches (10-15 cm). You should feel the muscles in your outer hip lifting. ? Do not let your foot drift forward. ? Do not let your knee roll toward the ceiling. 4. Hold this position for __________ seconds. 5. Slowly return to the starting position. 6. Let your muscles relax completely. Repeat __________ times. Complete this exercise __________ times a day. Exercise E: Clamshell ( hip abductors and external rotators) 6. Lie on your left / right side with your knees bent to about 60 degrees. 7. Put your legs on top of each other and your feet together. You can put a pillow between your knees for comfort. 8. Keeping your feet  together, lift your top knee up and back. Do not let your body roll backward. 9. Hold for ________seconds. 10. Return to the starting position. Repeat __________ times. Do both sides if told by your health care provider. Complete this exercise __________ times a day. Exercise F: Bridge ( hip extensors) 1. Lie on your back on a firm surface with your knees bent and your feet flat on the floor. 2. Tighten your abdominal and buttocks muscles and lift your bottom off the floor until your trunk is level with your thighs. ? Do not arch your back. ? You should feel the muscles working in your buttocks and the back of your thighs. 3. Hold this position for __________ seconds. 4. Slowly lower your hips to the starting position. 5. Let your muscles relax completely between repetitions. 6. If this exercise is too easy, try lifting one leg 2-4 inches (5-10 cm) off the floor while your bottom is up off the floor. Repeat __________ times. Complete this exercise __________ times a day.

## 2017-10-31 ENCOUNTER — Other Ambulatory Visit: Payer: Self-pay | Admitting: Internal Medicine

## 2017-11-14 ENCOUNTER — Encounter: Payer: Self-pay | Admitting: Internal Medicine

## 2017-11-15 ENCOUNTER — Other Ambulatory Visit: Payer: Self-pay | Admitting: Internal Medicine

## 2017-11-15 DIAGNOSIS — M25569 Pain in unspecified knee: Principal | ICD-10-CM

## 2017-11-15 DIAGNOSIS — G8929 Other chronic pain: Secondary | ICD-10-CM

## 2017-11-29 ENCOUNTER — Encounter (INDEPENDENT_AMBULATORY_CARE_PROVIDER_SITE_OTHER): Payer: Self-pay | Admitting: Orthopaedic Surgery

## 2017-11-29 ENCOUNTER — Ambulatory Visit (INDEPENDENT_AMBULATORY_CARE_PROVIDER_SITE_OTHER): Payer: 59 | Admitting: Orthopaedic Surgery

## 2017-11-29 ENCOUNTER — Ambulatory Visit (INDEPENDENT_AMBULATORY_CARE_PROVIDER_SITE_OTHER): Payer: Self-pay

## 2017-11-29 DIAGNOSIS — G8929 Other chronic pain: Secondary | ICD-10-CM

## 2017-11-29 DIAGNOSIS — M25562 Pain in left knee: Secondary | ICD-10-CM | POA: Diagnosis not present

## 2017-11-29 MED ORDER — DICLOFENAC SODIUM 1 % TD GEL: 2.0000 g | Freq: Four times a day (QID) | TRANSDERMAL | 5 refills | Status: DC

## 2017-11-29 MED ORDER — MELOXICAM 7.5 MG PO TABS: 7.5000 mg | ORAL_TABLET | Freq: Two times a day (BID) | ORAL | 2 refills | Status: DC | PRN

## 2017-11-29 NOTE — Progress Notes (Addendum)
Office Visit Note   Patient: Hailey [name]           Date of Birth: 1961-01-29           MRN: [id number] Visit Date: 11/29/2017              Requested by: Gordy Savers, MD [address] [place], [place] [address] PCP: Gordy Savers, MD   Assessment & Plan: Visit Diagnoses:  1. Chronic pain of left knee     Plan: Impression is 57 year old female with left knee osteoarthritis.  We discussed multiple treatment options.  She would like to start with Voltaren gel and meloxicam.  She is very active and trying to lose weight.  No injections today.  Follow-up as needed. Patient needs a custom brace due to her leg size and shape.  Follow-Up Instructions: Return if symptoms worsen or fail to improve.   Orders:  Orders Placed This Encounter  Procedures  . XR KNEE 3 VIEW LEFT   Meds ordered this encounter  Medications  . diclofenac sodium (VOLTAREN) 1 % GEL    Sig: Apply 2 g topically 4 (four) times daily.    Dispense:  1 Tube    Refill:  5  . meloxicam (MOBIC) 7.5 MG tablet    Sig: Take 1 tablet (7.5 mg total) by mouth 2 (two) times daily as needed for pain.    Dispense:  30 tablet    Refill:  2      Procedures: No procedures performed   Clinical Data: No additional findings.   Subjective: Chief Complaint  Patient presents with  . Left Knee - Pain, Follow-up    Patient is a 57 year old female who comes in with left knee pain for about 4-5 months.  She is very active and she has been having medial knee pain that has migrated to throughout the knee.  Denies any mechanical symptoms.  Swelling is worse at the end of the day.    Review of Systems  Constitutional: Negative.   HENT: Negative.   Eyes: Negative.   Respiratory: Negative.   Cardiovascular: Negative.   Endocrine: Negative.   Musculoskeletal: Negative.   Neurological: Negative.   Hematological: Negative.   Psychiatric/Behavioral: Negative.   All other systems reviewed and are  negative.    Objective: Vital Signs: There were no vitals taken for this visit.  Physical Exam  Constitutional: She is oriented to person, place, and time. She appears well-developed and well-nourished.  HENT:  Head: Normocephalic and atraumatic.  Eyes: EOM are normal.  Neck: Neck supple.  Pulmonary/Chest: Effort normal.  Abdominal: Soft.  Neurological: She is alert and oriented to person, place, and time.  Skin: Skin is warm. Capillary refill takes less than 2 seconds.  Psychiatric: She has a normal mood and affect. Her behavior is normal. Judgment and thought content normal.  Nursing note and vitals reviewed.   Ortho Exam Left knee exam shows no joint effusion.  Collaterals and cruciates are stable with mild laxity of the medial collateral ligament.  No real joint line tenderness. Specialty Comments:  No specialty comments available.  Imaging: Xr Knee 3 View Left  Result Date: 11/29/2017 Mild joint space narrowing consistent with mild osteoarthritis    PMFS History: Patient Active Problem List   Diagnosis Date Noted  . CARPAL TUNNEL SYNDROME, BILATERAL 08/04/2010  . GERD 03/06/2008  . OBESITY NOS 02/10/2007  . Essential hypertension 02/10/2007   Past Medical History:  Diagnosis Date  . CARPAL  TUNNEL SYNDROME, BILATERAL 08/04/2010  . GERD 03/06/2008  . HYPERTENSION 02/10/2007  . Obesity   . OBESITY NOS 02/10/2007    No family history on file.  Past Surgical History:  Procedure Laterality Date  . HEMORRHOID SURGERY    . NASAL SINUS SURGERY     Social History   Occupational History  . Not on file  Tobacco Use  . Smoking status: Never Smoker  . Smokeless tobacco: Never Used  Substance and Sexual Activity  . Alcohol use: Yes    Comment: rarley  . Drug use: No  . Sexual activity: Not on file

## 2017-12-15 ENCOUNTER — Ambulatory Visit (INDEPENDENT_AMBULATORY_CARE_PROVIDER_SITE_OTHER): Payer: 59 | Admitting: Orthopaedic Surgery

## 2017-12-15 ENCOUNTER — Encounter (INDEPENDENT_AMBULATORY_CARE_PROVIDER_SITE_OTHER): Payer: Self-pay | Admitting: Orthopaedic Surgery

## 2017-12-15 DIAGNOSIS — M1712 Unilateral primary osteoarthritis, left knee: Secondary | ICD-10-CM | POA: Diagnosis not present

## 2017-12-15 MED ORDER — LIDOCAINE HCL 1 % IJ SOLN
2.0000 mL | INTRAMUSCULAR | Status: AC | PRN
  Administered 2017-12-15: 2 mL

## 2017-12-15 MED ORDER — BUPIVACAINE HCL 0.25 % IJ SOLN
2.0000 mL | INTRAMUSCULAR | Status: AC | PRN
  Administered 2017-12-15: 2 mL via INTRA_ARTICULAR

## 2017-12-15 MED ORDER — METHYLPREDNISOLONE ACETATE 40 MG/ML IJ SUSP
40.0000 mg | INTRAMUSCULAR | Status: AC | PRN
  Administered 2017-12-15: 40 mg via INTRA_ARTICULAR

## 2017-12-15 NOTE — Progress Notes (Signed)
   Office Visit Note   Patient: Hailey [name]           Date of Birth: 03/29/1961           MRN: [id number] Visit Date: 12/15/2017              Requested by: Gordy SaversKwiatkowski, Peter F, MD [address] [place], [place] [address] PCP: Gordy SaversKwiatkowski, Peter F, MD   Assessment & Plan: Visit Diagnoses:  1. Primary localized osteoarthritis of left knee     Plan: Impression is left knee primary localized osteoarthritis.  Today, we proceed with intra-articular cortisone injection.  Hopeful this will help to settle things down.  She will follow-up with us on as-needed basis.  She will call with concerns or questions in the meantime.  Follow-Up Instructions: Return if symptoms worsen or fail to improve.   Orders:  Orders Placed This Encounter  Procedures  . Large Joint Inj: L knee   No orders of the defined types were placed in this encounter.     Procedures: Large Joint Inj: L knee on 12/15/2017 3:44 PM Indications: pain Details: 22 G needle, anterolateral approach Medications: 2 mL lidocaine 1 %; 2 mL bupivacaine 0.25 %; 40 mg methylPREDNISolone acetate 40 MG/ML      Clinical Data: No additional findings.   Subjective: No chief complaint on file.   HPI Hailey [name] comes in for follow-up.  She was seen a few weeks ago for osteoarthritis and offered a cortisone injection.  She declined that she wanted to try oral anti-inflammatories first.  She comes in today with continued pain to the left knee.  At this point, she would like to try a cortisone injection.  Review of Systems as detailed in HPI.  All others are negative.   Objective: Vital Signs: There were no vitals taken for this visit.  Physical Exam well-developed well-nourished female no acute distress.  Alert and oriented x3.  Ortho Exam stable left knee exam.  No effusion.  Specialty Comments:  No specialty comments available.  Imaging: No new imaging.   PMFS History: Patient Active Problem List   Diagnosis  Date Noted  . Primary localized osteoarthritis of left knee 12/15/2017  . CARPAL TUNNEL SYNDROME, BILATERAL 08/04/2010  . GERD 03/06/2008  . OBESITY NOS 02/10/2007  . Essential hypertension 02/10/2007   Past Medical History:  Diagnosis Date  . CARPAL TUNNEL SYNDROME, BILATERAL 08/04/2010  . GERD 03/06/2008  . HYPERTENSION 02/10/2007  . Obesity   . OBESITY NOS 02/10/2007    History reviewed. No pertinent family history.  Past Surgical History:  Procedure Laterality Date  . HEMORRHOID SURGERY    . NASAL SINUS SURGERY     Social History   Occupational History  . Not on file  Tobacco Use  . Smoking status: Never Smoker  . Smokeless tobacco: Never Used  Substance and Sexual Activity  . Alcohol use: Yes    Comment: rarley  . Drug use: No  . Sexual activity: Not on file

## 2018-01-17 ENCOUNTER — Telehealth (INDEPENDENT_AMBULATORY_CARE_PROVIDER_SITE_OTHER): Payer: Self-pay | Admitting: Orthopaedic Surgery

## 2018-01-17 NOTE — Telephone Encounter (Signed)
Patient called and would like to know the name and # of the [employer] representative. She doesn't believe the customized knee brace is working properly. Her # [phone]

## 2018-01-18 ENCOUNTER — Encounter: Payer: Self-pay | Admitting: Internal Medicine

## 2018-01-18 NOTE — Telephone Encounter (Signed)
Emailed [employer]. He will contact patient.

## 2018-01-21 ENCOUNTER — Other Ambulatory Visit: Payer: Self-pay | Admitting: Internal Medicine

## 2018-01-21 ENCOUNTER — Other Ambulatory Visit (INDEPENDENT_AMBULATORY_CARE_PROVIDER_SITE_OTHER): Payer: Self-pay | Admitting: Orthopaedic Surgery

## 2018-01-21 MED ORDER — PHENTERMINE HCL 30 MG PO CAPS: 30.0000 mg | ORAL_CAPSULE | Freq: Every morning | ORAL | 2 refills | Status: DC

## 2018-01-21 NOTE — Telephone Encounter (Signed)
Rx request 

## 2018-03-15 ENCOUNTER — Other Ambulatory Visit: Payer: Self-pay | Admitting: Obstetrics and Gynecology

## 2018-03-15 DIAGNOSIS — Z803 Family history of malignant neoplasm of breast: Secondary | ICD-10-CM

## 2018-04-05 ENCOUNTER — Encounter (INDEPENDENT_AMBULATORY_CARE_PROVIDER_SITE_OTHER): Payer: Self-pay | Admitting: Orthopaedic Surgery

## 2018-04-05 ENCOUNTER — Telehealth (INDEPENDENT_AMBULATORY_CARE_PROVIDER_SITE_OTHER): Payer: Self-pay

## 2018-04-05 ENCOUNTER — Ambulatory Visit (INDEPENDENT_AMBULATORY_CARE_PROVIDER_SITE_OTHER): Payer: 59 | Admitting: Orthopaedic Surgery

## 2018-04-05 DIAGNOSIS — G8929 Other chronic pain: Secondary | ICD-10-CM | POA: Diagnosis not present

## 2018-04-05 DIAGNOSIS — M25562 Pain in left knee: Secondary | ICD-10-CM

## 2018-04-05 MED ORDER — LIDOCAINE HCL 1 % IJ SOLN
2.0000 mL | INTRAMUSCULAR | Status: AC | PRN
  Administered 2018-04-05: 2 mL

## 2018-04-05 MED ORDER — BUPIVACAINE HCL 0.25 % IJ SOLN
2.0000 mL | INTRAMUSCULAR | Status: AC | PRN
  Administered 2018-04-05: 2 mL via INTRA_ARTICULAR

## 2018-04-05 MED ORDER — METHYLPREDNISOLONE ACETATE 40 MG/ML IJ SUSP
40.0000 mg | INTRAMUSCULAR | Status: AC | PRN
  Administered 2018-04-05: 40 mg via INTRA_ARTICULAR

## 2018-04-05 NOTE — Telephone Encounter (Signed)
Please submit gel injection for patient- LEFT KNEE- Dr Roda ShuttersXU

## 2018-04-05 NOTE — Progress Notes (Signed)
Office Visit Note   Patient: Hailey [name]           Date of Birth: 03/28/1961           MRN: [id number] Visit Date: 04/05/2018              Requested by: Gordy SaversKwiatkowski, Peter F, MD [address] [place], [place] [address] PCP: Gordy SaversKwiatkowski, Peter F, MD   Assessment & Plan: Visit Diagnoses:  1. Chronic pain of left knee     Plan: Impression is left knee primary localized osteoarthritis.  Today we will reinject this with cortisone.  We will go ahead and send for approval for [facility] supplementation injections.  She will be called once these have been approved.  Told her that she can wait until her cortisone injection wears off before proceeding with the Visco supplementation injection if she would like.  Call with concerns or questions in the meantime.  Follow-Up Instructions: Return if symptoms worsen or fail to improve.   Orders:  Orders Placed This Encounter  Procedures  . Large Joint Inj: L knee   No orders of the defined types were placed in this encounter.     Procedures: Large Joint Inj: L knee on 04/05/2018 4:09 PM Indications: pain Details: 22 G needle, anterolateral approach Medications: 2 mL lidocaine 1 %; 2 mL bupivacaine 0.25 %; 40 mg methylPREDNISolone acetate 40 MG/ML      Clinical Data: No additional findings.   Subjective: Chief Complaint  Patient presents with  . Left Knee - Pain    HPI patient is a pleasant 57 year old female who presents to our clinic today with recurrent left knee pain.  History of osteoarthritis for the past several months.  We saw her approximately 4 months ago where we injected the left knee with cortisone.  This significantly helped until yesterday.  Her pain has returned and has dramatically worsened.  She has been wearing a DJD unloading brace with moderate relief as well.  Pain is worse after standing all day as well as when she tries to exercise.  She comes in requesting another cortisone injection.  Review of Systems  as detailed in HPI.  All others reviewed and are negative.   Objective: Vital Signs: There were no vitals taken for this visit.  Physical Exam well-developed well-nourished female in no acute distress.  Alert and oriented x3.  Ortho Exam examination of the left knee shows range of motion from 0 to 110 degrees.  Moderate patellofemoral crepitus.  Medial joint line tenderness.  She is neurovascularly intact distally.  Specialty Comments:  No specialty comments available.  Imaging: No new imaging   PMFS History: Patient Active Problem List   Diagnosis Date Noted  . Primary localized osteoarthritis of left knee 12/15/2017  . CARPAL TUNNEL SYNDROME, BILATERAL 08/04/2010  . GERD 03/06/2008  . Chronic pain of left knee 09/14/2007  . OBESITY NOS 02/10/2007  . Essential hypertension 02/10/2007   Past Medical History:  Diagnosis Date  . CARPAL TUNNEL SYNDROME, BILATERAL 08/04/2010  . GERD 03/06/2008  . HYPERTENSION 02/10/2007  . Obesity   . OBESITY NOS 02/10/2007    History reviewed. No pertinent family history.  Past Surgical History:  Procedure Laterality Date  . HEMORRHOID SURGERY    . NASAL SINUS SURGERY     Social History   Occupational History  . Not on file  Tobacco Use  . Smoking status: Never Smoker  . Smokeless tobacco: Never Used  Substance and Sexual Activity  .  Alcohol use: Yes    Comment: rarley  . Drug use: No  . Sexual activity: Not on file

## 2018-04-06 NOTE — Telephone Encounter (Signed)
Noted  

## 2018-04-07 ENCOUNTER — Telehealth (INDEPENDENT_AMBULATORY_CARE_PROVIDER_SITE_OTHER): Payer: Self-pay

## 2018-04-07 NOTE — Telephone Encounter (Signed)
Submitted application online for Monovisc, left knee. 

## 2018-04-11 ENCOUNTER — Telehealth (INDEPENDENT_AMBULATORY_CARE_PROVIDER_SITE_OTHER): Payer: Self-pay

## 2018-04-11 NOTE — Telephone Encounter (Signed)
Submitted Rx form online through [facility] portal for Monovisc, left knee.

## 2018-04-15 ENCOUNTER — Telehealth (INDEPENDENT_AMBULATORY_CARE_PROVIDER_SITE_OTHER): Payer: Self-pay

## 2018-04-15 NOTE — Telephone Encounter (Signed)
Faxed Rx for Monovisc, left knee to [facility] at [phone].

## 2018-04-18 ENCOUNTER — Telehealth (INDEPENDENT_AMBULATORY_CARE_PROVIDER_SITE_OTHER): Payer: Self-pay

## 2018-04-18 NOTE — Telephone Encounter (Signed)
Faxed Rx for Monovisc, left knee to [facility] at [phone]. 

## 2018-04-22 ENCOUNTER — Telehealth (INDEPENDENT_AMBULATORY_CARE_PROVIDER_SITE_OTHER): Payer: Self-pay

## 2018-04-22 NOTE — Telephone Encounter (Signed)
Talked with [facility] and advised them that this is patient's first gel injection.

## 2018-04-25 ENCOUNTER — Telehealth (INDEPENDENT_AMBULATORY_CARE_PROVIDER_SITE_OTHER): Payer: Self-pay | Admitting: Orthopaedic Surgery

## 2018-04-25 NOTE — Telephone Encounter (Signed)
FYI  to you too 

## 2018-04-25 NOTE — Telephone Encounter (Signed)
[name] stating will [facility] Monovisc injection tomorrow. The number contact Hailey CrapeClaudia if needed is [phone]

## 2018-04-26 ENCOUNTER — Other Ambulatory Visit: Payer: Self-pay | Admitting: Obstetrics and Gynecology

## 2018-04-26 ENCOUNTER — Ambulatory Visit
Admission: RE | Admit: 2018-04-26 | Discharge: 2018-04-26 | Disposition: A | Discharge status: Home / Self Care | Payer: 59 | Source: Ambulatory Visit | Attending: Obstetrics and Gynecology | Admitting: Obstetrics and Gynecology

## 2018-04-26 ENCOUNTER — Telehealth (INDEPENDENT_AMBULATORY_CARE_PROVIDER_SITE_OTHER): Payer: Self-pay

## 2018-04-26 DIAGNOSIS — Z803 Family history of malignant neoplasm of breast: Secondary | ICD-10-CM

## 2018-04-26 MED ORDER — GADOBENATE DIMEGLUMINE 529 MG/ML IV SOLN
20.0000 mL | Freq: Once | INTRAVENOUS | Status: AC | PRN
  Administered 2018-04-26: 20 mL via INTRAVENOUS

## 2018-04-26 NOTE — Telephone Encounter (Signed)
Noted. Thank You.

## 2018-04-26 NOTE — Telephone Encounter (Signed)
Patient approved for Monovisc, left knee. Purchased through [facility] No PA required After deductible has been met, patient will be responsible for 20% OOP. Appt.scheduled 04/27/2018

## 2018-04-27 ENCOUNTER — Ambulatory Visit (INDEPENDENT_AMBULATORY_CARE_PROVIDER_SITE_OTHER): Payer: 59 | Admitting: Orthopaedic Surgery

## 2018-04-27 DIAGNOSIS — M1712 Unilateral primary osteoarthritis, left knee: Secondary | ICD-10-CM | POA: Diagnosis not present

## 2018-04-27 MED ORDER — HYALURONAN 88 MG/4ML IX SOSY
88.0000 mg | PREFILLED_SYRINGE | INTRA_ARTICULAR | Status: AC | PRN
  Administered 2018-04-27: 88 mg via INTRA_ARTICULAR

## 2018-04-27 NOTE — Progress Notes (Signed)
   Procedure Note  Patient: Hailey [name]             Date of Birth: 04/19/1961           MRN: [id number]             Visit Date: 04/27/2018  Procedures: Visit Diagnoses: Primary localized osteoarthritis of left knee  Large Joint Inj: L knee on 04/27/2018 4:27 PM Indications: pain Details: 22 G needle  Arthrogram: No  Medications: 88 mg Hyaluronan 88 MG/4ML Outcome: tolerated well, no immediate complications Patient was prepped and draped in the usual sterile fashion.

## 2018-05-01 ENCOUNTER — Ambulatory Visit
Admission: RE | Admit: 2018-05-01 | Discharge: 2018-05-01 | Disposition: A | Discharge status: Home / Self Care | Payer: 59 | Source: Ambulatory Visit | Attending: Obstetrics and Gynecology | Admitting: Obstetrics and Gynecology

## 2018-05-01 DIAGNOSIS — Z803 Family history of malignant neoplasm of breast: Secondary | ICD-10-CM

## 2018-05-01 MED ORDER — GADOBENATE DIMEGLUMINE 529 MG/ML IV SOLN
20.0000 mL | Freq: Once | INTRAVENOUS | Status: AC | PRN
  Administered 2018-05-01: 20 mL via INTRAVENOUS

## 2018-05-05 ENCOUNTER — Other Ambulatory Visit (INDEPENDENT_AMBULATORY_CARE_PROVIDER_SITE_OTHER): Payer: Self-pay | Admitting: Physician Assistant

## 2018-06-05 ENCOUNTER — Other Ambulatory Visit (INDEPENDENT_AMBULATORY_CARE_PROVIDER_SITE_OTHER): Payer: Self-pay | Admitting: Orthopaedic Surgery

## 2018-06-25 ENCOUNTER — Telehealth: Payer: 59 | Admitting: Physician Assistant

## 2018-06-25 DIAGNOSIS — B9689 Other specified bacterial agents as the cause of diseases classified elsewhere: Secondary | ICD-10-CM

## 2018-06-25 DIAGNOSIS — J019 Acute sinusitis, unspecified: Secondary | ICD-10-CM

## 2018-06-25 MED ORDER — AMOXICILLIN-POT CLAVULANATE 875-125 MG PO TABS: 1.0000 | ORAL_TABLET | Freq: Two times a day (BID) | ORAL | 0 refills | Status: DC

## 2018-06-25 NOTE — Progress Notes (Signed)

## 2018-07-15 ENCOUNTER — Other Ambulatory Visit (INDEPENDENT_AMBULATORY_CARE_PROVIDER_SITE_OTHER): Payer: Self-pay | Admitting: Physician Assistant

## 2018-07-18 NOTE — Telephone Encounter (Signed)
Ok to call in

## 2018-07-19 NOTE — Telephone Encounter (Signed)
Ok to refill 

## 2018-10-04 ENCOUNTER — Ambulatory Visit: Payer: BLUE CROSS/BLUE SHIELD | Admitting: Family Medicine

## 2018-10-04 ENCOUNTER — Encounter: Payer: Self-pay | Admitting: Family Medicine

## 2018-10-04 VITALS — BP 120/70 | HR 78 | Temp 98.2°F | Ht 65.0 in | Wt 229.0 lb

## 2018-10-04 DIAGNOSIS — J329 Chronic sinusitis, unspecified: Secondary | ICD-10-CM

## 2018-10-04 DIAGNOSIS — I10 Essential (primary) hypertension: Secondary | ICD-10-CM | POA: Diagnosis not present

## 2018-10-04 DIAGNOSIS — E6609 Other obesity due to excess calories: Secondary | ICD-10-CM

## 2018-10-04 DIAGNOSIS — M25562 Pain in left knee: Secondary | ICD-10-CM | POA: Diagnosis not present

## 2018-10-04 DIAGNOSIS — G8929 Other chronic pain: Secondary | ICD-10-CM

## 2018-10-04 DIAGNOSIS — Z6838 Body mass index (BMI) 38.0-38.9, adult: Secondary | ICD-10-CM

## 2018-10-04 MED ORDER — BENAZEPRIL HCL 20 MG PO TABS: ORAL_TABLET | ORAL | 0 refills | Status: DC

## 2018-10-04 MED ORDER — PHENTERMINE HCL 30 MG PO CAPS: 30.0000 mg | ORAL_CAPSULE | Freq: Every morning | ORAL | 2 refills | Status: DC

## 2018-10-04 NOTE — Patient Instructions (Signed)
DASH Eating Plan DASH stands for "Dietary Approaches to Stop Hypertension." The DASH eating plan is a healthy eating plan that has been shown to reduce high blood pressure (hypertension). It may also reduce your risk for type 2 diabetes, heart disease, and stroke. The DASH eating plan may also help with weight loss. What are tips for following this plan?  General guidelines  Avoid eating more than 2,300 mg (milligrams) of salt (sodium) a day. If you have hypertension, you may need to reduce your sodium intake to 1,500 mg a day.  Limit alcohol intake to no more than 1 drink a day for nonpregnant women and 2 drinks a day for men. One drink equals 12 oz of beer, 5 oz of wine, or 1 oz of hard liquor.  Work with your health care provider to maintain a healthy body weight or to lose weight. Ask what an ideal weight is for you.  Get at least 30 minutes of exercise that causes your heart to beat faster (aerobic exercise) most days of the week. Activities may include walking, swimming, or biking.  Work with your health care provider or diet and nutrition specialist (dietitian) to adjust your eating plan to your individual calorie needs. Reading food labels   Check food labels for the amount of sodium per serving. Choose foods with less than 5 percent of the Daily Value of sodium. Generally, foods with less than 300 mg of sodium per serving fit into this eating plan.  To find whole grains, look for the word "whole" as the first word in the ingredient list. Shopping  Buy products labeled as "low-sodium" or "no salt added."  Buy fresh foods. Avoid canned foods and premade or frozen meals. Cooking  Avoid adding salt when cooking. Use salt-free seasonings or herbs instead of table salt or sea salt. Check with your health care provider or pharmacist before using salt substitutes.  Do not fry foods. Cook foods using healthy methods such as baking, boiling, grilling, and broiling instead.  Cook with  heart-healthy oils, such as olive, canola, soybean, or sunflower oil. Meal planning  Eat a balanced diet that includes: ? 5 or more servings of fruits and vegetables each day. At each meal, try to fill half of your plate with fruits and vegetables. ? Up to 6-8 servings of whole grains each day. ? Less than 6 oz of lean meat, poultry, or fish each day. A 3-oz serving of meat is about the same size as a deck of cards. One egg equals 1 oz. ? 2 servings of low-fat dairy each day. ? A serving of nuts, seeds, or beans 5 times each week. ? Heart-healthy fats. Healthy fats called Omega-3 fatty acids are found in foods such as flaxseeds and coldwater fish, like sardines, salmon, and mackerel.  Limit how much you eat of the following: ? Canned or prepackaged foods. ? Food that is high in trans fat, such as fried foods. ? Food that is high in saturated fat, such as fatty meat. ? Sweets, desserts, sugary drinks, and other foods with added sugar. ? Full-fat dairy products.  Do not salt foods before eating.  Try to eat at least 2 vegetarian meals each week.  Eat more home-cooked food and less restaurant, buffet, and fast food.  When eating at a restaurant, ask that your food be prepared with less salt or no salt, if possible. What foods are recommended? The items listed may not be a complete list. Talk with your dietitian about   what dietary choices are best for you. Grains Whole-grain or whole-wheat bread. Whole-grain or whole-wheat pasta. Brown rice. Oatmeal. Quinoa. Bulgur. Whole-grain and low-sodium cereals. Pita bread. Low-fat, low-sodium crackers. Whole-wheat flour tortillas. Vegetables Fresh or frozen vegetables (raw, steamed, roasted, or grilled). Low-sodium or reduced-sodium tomato and vegetable juice. Low-sodium or reduced-sodium tomato sauce and tomato paste. Low-sodium or reduced-sodium canned vegetables. Fruits All fresh, dried, or frozen fruit. Canned fruit in natural juice (without  added sugar). Meat and other protein foods Skinless chicken or turkey. Ground chicken or turkey. Pork with fat trimmed off. Fish and seafood. Egg whites. Dried beans, peas, or lentils. Unsalted nuts, nut butters, and seeds. Unsalted canned beans. Lean cuts of beef with fat trimmed off. Low-sodium, lean deli meat. Dairy Low-fat (1%) or fat-free (skim) milk. Fat-free, low-fat, or reduced-fat cheeses. Nonfat, low-sodium ricotta or cottage cheese. Low-fat or nonfat yogurt. Low-fat, low-sodium cheese. Fats and oils Soft margarine without trans fats. Vegetable oil. Low-fat, reduced-fat, or light mayonnaise and salad dressings (reduced-sodium). Canola, safflower, olive, soybean, and sunflower oils. Avocado. Seasoning and other foods Herbs. Spices. Seasoning mixes without salt. Unsalted popcorn and pretzels. Fat-free sweets. What foods are not recommended? The items listed may not be a complete list. Talk with your dietitian about what dietary choices are best for you. Grains Baked goods made with fat, such as croissants, muffins, or some breads. Dry pasta or rice meal packs. Vegetables Creamed or fried vegetables. Vegetables in a cheese sauce. Regular canned vegetables (not low-sodium or reduced-sodium). Regular canned tomato sauce and paste (not low-sodium or reduced-sodium). Regular tomato and vegetable juice (not low-sodium or reduced-sodium). Pickles. Olives. Fruits Canned fruit in a light or heavy syrup. Fried fruit. Fruit in cream or butter sauce. Meat and other protein foods Fatty cuts of meat. Ribs. Fried meat. Bacon. Sausage. Bologna and other processed lunch meats. Salami. Fatback. Hotdogs. Bratwurst. Salted nuts and seeds. Canned beans with added salt. Canned or smoked fish. Whole eggs or egg yolks. Chicken or turkey with skin. Dairy Whole or 2% milk, cream, and half-and-half. Whole or full-fat cream cheese. Whole-fat or sweetened yogurt. Full-fat cheese. Nondairy creamers. Whipped toppings.  Processed cheese and cheese spreads. Fats and oils Butter. Stick margarine. Lard. Shortening. Ghee. Bacon fat. Tropical oils, such as coconut, palm kernel, or palm oil. Seasoning and other foods Salted popcorn and pretzels. Onion salt, garlic salt, seasoned salt, table salt, and sea salt. Worcestershire sauce. Tartar sauce. Barbecue sauce. Teriyaki sauce. Soy sauce, including reduced-sodium. Steak sauce. Canned and packaged gravies. Fish sauce. Oyster sauce. Cocktail sauce. Horseradish that you find on the shelf. Ketchup. Mustard. Meat flavorings and tenderizers. Bouillon cubes. Hot sauce and Tabasco sauce. Premade or packaged marinades. Premade or packaged taco seasonings. Relishes. Regular salad dressings. Where to find more information:  National Heart, Lung, and Blood Institute: www.nhlbi.nih.gov  American Heart Association: www.heart.org Summary  The DASH eating plan is a healthy eating plan that has been shown to reduce high blood pressure (hypertension). It may also reduce your risk for type 2 diabetes, heart disease, and stroke.  With the DASH eating plan, you should limit salt (sodium) intake to 2,300 mg a day. If you have hypertension, you may need to reduce your sodium intake to 1,500 mg a day.  When on the DASH eating plan, aim to eat more fresh fruits and vegetables, whole grains, lean proteins, low-fat dairy, and heart-healthy fats.  Work with your health care provider or diet and nutrition specialist (dietitian) to adjust your eating plan to your   individual calorie needs. This information is not intended to replace advice given to you by your health care provider. Make sure you discuss any questions you have with your health care provider. Document Released: 09/03/2011 Document Revised: 09/07/2016 Document Reviewed: 09/07/2016 Elsevier Interactive Patient Education  2019 [employer] Inc[employer]  BMI for Adults  Body mass index (BMI) is a number that is calculated from a person's weight  and height. BMI may help to estimate how much of a person's weight is composed of fat. BMI can help identify those who may be at higher risk for certain medical problems. How is BMI used with adults? BMI is used as a screening tool to identify possible weight problems. It is used to check whether a person is obese, overweight, healthy weight, or underweight. How is BMI calculated? BMI measures your weight and compares it to your height. This can be done either in [place] ([place] or metric measurements. Note that charts are available to help you find your BMI quickly and easily without having to do these calculations yourself. To calculate your BMI in [place] ([place] measurements, your health care provider will: 1. Measure your weight in pounds (lb). 2. Multiply the number of pounds by 703. ? For example, for a person who weighs 180 lb, multiply that number by 703, which equals 126,540. 3. Measure your height in inches (in). Then multiply that number by itself to get a measurement called "inches squared." ? For example, for a person who is 70 in tall, the "inches squared" measurement is 70 in x 70 in, which equals 4900 inches squared. 4. Divide the total from Step 2 (number of lb x 703) by the total from Step 3 (inches squared): 126,540  4900 = 25.8. This is your BMI. To calculate your BMI in metric measurements, your health care provider will: 1. Measure your weight in kilograms (kg). 2. Measure your height in meters (m). Then multiply that number by itself to get a measurement called "meters squared." ? For example, for a person who is 1.75 m tall, the "meters squared" measurement is 1.75 m x 1.75 m, which is equal to 3.1 meters squared. 3. Divide the number of kilograms (your weight) by the meters squared number. In this example: 70  3.1 = 22.6. This is your BMI. How is BMI interpreted? To interpret your results, your health care provider will use BMI charts to identify whether you are  underweight, normal weight, overweight, or obese. The following guidelines will be used:  Underweight: BMI less than 18.5.  Normal weight: BMI between 18.5 and 24.9.  Overweight: BMI between 25 and 29.9.  Obese: BMI of 30 and above. Please note:  Weight includes both fat and muscle, so someone with a muscular build, such as an athlete, may have a BMI that is higher than 24.9. In cases like these, BMI is not an accurate measure of body fat.  To determine if excess body fat is the cause of a BMI of 25 or higher, further assessments may need to be done by a health care provider.  BMI is usually interpreted in the same way for men and women. Why is BMI a useful tool? BMI is useful in two ways:  Identifying a weight problem that may be related to a medical condition, or that may increase the risk for medical problems.  Promoting lifestyle and diet changes in order to reach a healthy weight. Summary  Body mass index (BMI) is a number that is calculated from a person's  weight and height.  BMI may help to estimate how much of a person's weight is composed of fat. BMI can help identify those who may be at higher risk for certain medical problems.  BMI can be measured using English measurements or metric measurements.  To interpret your results, your health care provider will use BMI charts to identify whether you are underweight, normal weight, overweight, or obese. This information is not intended to replace advice given to you by your health care provider. Make sure you discuss any questions you have with your health care provider. Document Released: 05/26/2004 Document Revised: 07/28/2017 Document Reviewed: 07/28/2017 Elsevier Interactive Patient Education  2019 [employer]  Exercising to [employer] Weight Exercise is structured, repetitive physical activity to improve fitness and health. Getting regular exercise is important for everyone. It is especially important if you are overweight.  Being overweight increases your risk of heart disease, stroke, diabetes, high blood pressure, and several types of cancer. Reducing your calorie intake and exercising can help you lose weight. Exercise is usually categorized as moderate or vigorous intensity. To lose weight, most people need to do a certain amount of moderate-intensity or vigorous-intensity exercise each week. Moderate-intensity exercise  Moderate-intensity exercise is any activity that gets you moving enough to burn at least three times more energy (calories) than if you were sitting. Examples of moderate exercise include:  Walking a mile in 15 minutes.  Doing light yard work.  Biking at an easy pace. Most people should get at least 150 minutes (2 hours and 30 minutes) a week of moderate-intensity exercise to maintain their body weight. Vigorous-intensity exercise Vigorous-intensity exercise is any activity that gets you moving enough to burn at least six times more calories than if you were sitting. When you exercise at this intensity, you should be working hard enough that you are not able to carry on a conversation. Examples of vigorous exercise include:  Running.  Playing a team sport, such as football, basketball, and soccer.  Jumping rope. Most people should get at least 75 minutes (1 hour and 15 minutes) a week of vigorous-intensity exercise to maintain their body weight. How can exercise affect me? When you exercise enough to burn more calories than you eat, you lose weight. Exercise also reduces body fat and builds muscle. The more muscle you have, the more calories you burn. Exercise also:  Improves mood.  Reduces stress and tension.  Improves your overall fitness, flexibility, and endurance.  Increases bone strength. The amount of exercise you need to lose weight depends on:  Your age.  The type of exercise.  Any health conditions you have.  Your overall physical ability. Talk to your health care  provider about how much exercise you need and what types of activities are safe for you. What actions can I take to lose weight? Nutrition   Make changes to your diet as told by your health care provider or diet and nutrition specialist (dietitian). This may include: ? Eating fewer calories. ? Eating more protein. ? Eating less unhealthy fats. ? Eating a diet that includes fresh fruits and vegetables, whole grains, low-fat dairy products, and lean protein. ? Avoiding foods with added fat, salt, and sugar.  Drink plenty of water while you exercise to prevent dehydration or heat stroke. Activity  Choose an activity that you enjoy and set realistic goals. Your health care provider can help you make an exercise plan that works for you.  Exercise at a moderate or vigorous intensity most  days of the week. ? The intensity of exercise may vary from person to person. You can tell how intense a workout is for you by paying attention to your breathing and heartbeat. Most people will notice their breathing and heartbeat get faster with more intense exercise.  Do resistance training twice each week, such as: ? Push-ups. ? Sit-ups. ? Lifting weights. ? Using resistance bands.  Getting short amounts of exercise can be just as helpful as long structured periods of exercise. If you have trouble finding time to exercise, try to include exercise in your daily routine. ? Get up, stretch, and walk around every 30 minutes throughout the day. ? Go for a walk during your lunch break. ? Park your car farther away from your destination. ? If you take public transportation, get off one stop early and walk the rest of the way. ? Make phone calls while standing up and walking around. ? Take the stairs instead of elevators or escalators.  Wear comfortable clothes and shoes with good support.  Do not exercise so much that you hurt yourself, feel dizzy, or get very short of breath. Where to find more  information  U.[place]. Department of Health and Human Services: ThisPath.fiwww.hhs.gov  Centers for Disease Control and Prevention (CDC): FootballExhibition.com.brwww.cdc.gov Contact a health care provider:  Before starting a new exercise program.  If you have questions or concerns about your weight.  If you have a medical problem that keeps you from exercising. Get help right away if you have any of the following while exercising:  Injury.  Dizziness.  Difficulty breathing or shortness of breath that does not go away when you stop exercising.  Chest pain.  Rapid heartbeat. Summary  Being overweight increases your risk of heart disease, stroke, diabetes, high blood pressure, and several types of cancer.  Losing weight happens when you burn more calories than you eat.  Reducing the amount of calories you eat in addition to getting regular moderate or vigorous exercise each week helps you lose weight. This information is not intended to replace advice given to you by your health care provider. Make sure you discuss any questions you have with your health care provider. Document Released: 10/17/2010 Document Revised: 09/27/2017 Document Reviewed: 09/27/2017 Elsevier Interactive Patient Education  2019 [employer]

## 2018-10-04 NOTE — Progress Notes (Signed)
Established Patient Office Visit  Subjective:  Patient ID: Hailey [name], female    DOB: 03/09/1961  Age: 58 y.o. MRN: [id number]  CC:  Chief Complaint  Patient presents with  . Establish Care    HPI Hailey [name] presents for establishment of care and follow-up for her hypertension that is been well controlled with Lotensin 20 mg.  Patient is tolerating this medicine well and says that her blood pressure typically runs in the 110/70 range.  She has a past medical history of obesity that continues today.  She is quite active physically on her home elliptical machine.  She prefers to be physically active outside and has a hard time continuing activity in the cooler months.  In the past Dr. Kirtland BouchardK is given her phentermine in the winter months to "jump start" her weight loss efforts.  Blood pressure remains well controlled even while taking this medication.  She has a past medical history of chronic pain in her left knee associated with arthritis.  She is followed by the orthopedist for this issue.  It sounds as though she has had some type of gel injection for her knees.  Her orthopedist is told her that she is not ready for total knee replacement.  Sounds like her sinuses bother her on an ongoing basis.  She has had 3 sinus surgeries.  She has tried the nasal steroid she assures me.  She is being followed also by ENT for this issue.  He lives with her husband who is also my patient.  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  She had been at the [employer] before it closed.  Chart review shows that patient's blood work was last checked in April 2018.  However, it was all normal.  Pap and pelvic exams are through OB/GYN.  Past Medical History:  Diagnosis Date  . CARPAL TUNNEL SYNDROME, BILATERAL 08/04/2010  . GERD 03/06/2008  . HYPERTENSION 02/10/2007  . Obesity   . OBESITY NOS 02/10/2007    Past Surgical History:  Procedure Laterality Date  .  HEMORRHOID SURGERY    . NASAL SINUS SURGERY      History reviewed. No pertinent family history.  Social History   Socioeconomic History  . Marital status: Married    Spouse name: Not on file  . Number of children: Not on file  . Years of education: Not on file  . Highest education level: Not on file  Occupational History  . Not on file  Social Needs  . Financial resource strain: Not on file  . Food insecurity:    Worry: Not on file    Inability: Not on file  . Transportation needs:    Medical: Not on file    Non-medical: Not on file  Tobacco Use  . Smoking status: Never Smoker  . Smokeless tobacco: Never Used  Substance and Sexual Activity  . Alcohol use: Yes    Comment: rarley  . Drug use: No  . Sexual activity: Not on file  Lifestyle  . Physical activity:    Days per week: Not on file    Minutes per session: Not on file  . Stress: Not on file  Relationships  . Social connections:    Talks on phone: Not on file    Gets together: Not on file    Attends religious service: Not on file    Active member of club or organization: Not on file  Attends meetings of clubs or organizations: Not on file    Relationship status: Not on file  . Intimate partner violence:    Fear of current or ex partner: Not on file    Emotionally abused: Not on file    Physically abused: Not on file    Forced sexual activity: Not on file  Other Topics Concern  . Not on file  Social History Narrative  . Not on file    Outpatient Medications Prior to Visit  Medication Sig Dispense Refill  . meloxicam (MOBIC) 7.5 MG tablet TAKE 1 TABLET BY MOUTH TWICE A DAY AS NEEDED FOR PAIN 30 tablet 2  . VOLTAREN 1 % GEL APPLY 2 GRAMS TO AFFECTED AREA 4 TIMES A DAY 100 g 5  . benazepril (LOTENSIN) 20 MG tablet TAKE 1 TABLET (20 MG TOTAL) BY MOUTH DAILY. 90 tablet 4  . phentermine 30 MG capsule Take 1 capsule (30 mg total) by mouth every morning. 60 capsule 2  . amoxicillin-clavulanate (AUGMENTIN)  875-125 MG tablet Take 1 tablet by mouth 2 (two) times daily. 14 tablet 0  . benazepril (LOTENSIN) 20 MG tablet TAKE 1 TABLET (20 MG TOTAL) BY MOUTH DAILY. 90 tablet 3   No facility-administered medications prior to visit.     Allergies  Allergen Reactions  . Sulfamethoxazole     REACTION: unspecified    ROS Review of Systems  Constitutional: Negative for fatigue, fever and unexpected weight change.  HENT: Negative.   Eyes: Negative for photophobia and visual disturbance.  Respiratory: Negative.   Cardiovascular: Negative.   Gastrointestinal: Negative.   Musculoskeletal: Positive for arthralgias and gait problem.  Neurological: Negative for light-headedness and headaches.  Hematological: Does not bruise/bleed easily.  Psychiatric/Behavioral: Negative.       Objective:    Physical Exam  Constitutional: She is oriented to person, place, and time. She appears well-developed and well-nourished. No distress.  HENT:  Head: Normocephalic and atraumatic.  Right Ear: External ear normal.  Left Ear: External ear normal.  Mouth/Throat: Oropharynx is clear and moist. No oropharyngeal exudate.  Eyes: Pupils are equal, round, and reactive to light. Conjunctivae are normal. Right eye exhibits no discharge. Left eye exhibits no discharge. No scleral icterus.  Neck: Neck supple. No JVD present. No tracheal deviation present. No thyromegaly present.  Cardiovascular: Normal rate, regular rhythm and normal heart sounds.  Pulmonary/Chest: Effort normal and breath sounds normal.  Lymphadenopathy:    She has no cervical adenopathy.  Neurological: She is alert and oriented to person, place, and time.  Skin: Skin is warm and dry. She is not diaphoretic.  Psychiatric: She has a normal mood and affect. Her behavior is normal.    BP 120/70   Pulse 78   Temp 98.2 F (36.8 C) (Oral)   Ht 5\' 5"  (1.651 m)   Wt 229 lb (103.9 kg)   SpO2 95%   BMI 38.11 kg/m  Wt Readings from Last 3 Encounters:    10/04/18 229 lb (103.9 kg)  07/22/17 226 lb 9.6 oz (102.8 kg)  12/29/16 215 lb 9.6 oz (97.8 kg)   BP Readings from Last 3 Encounters:  10/04/18 120/70  07/22/17 100/78  12/29/16 122/68   Guideline developer:  UpToDate (see UpToDate for funding source) Date Released: June 2014  Health Maintenance Due  Topic Date Due  . Hepatitis C Screening  06-03-1961  . HIV Screening  02/02/1976    There are no preventive care reminders to display for this patient.  Lab  Results  Component Value Date   TSH 1.49 12/29/2016   Lab Results  Component Value Date   WBC 4.6 12/29/2016   HGB 12.4 12/29/2016   HCT 36.8 12/29/2016   MCV 91.1 12/29/2016   PLT 314.0 12/29/2016   Lab Results  Component Value Date   NA 142 12/29/2016   K 4.3 12/29/2016   CO2 32 12/29/2016   GLUCOSE 83 12/29/2016   BUN 12 12/29/2016   CREATININE 0.66 12/29/2016   BILITOT 0.4 12/29/2016   ALKPHOS 90 12/29/2016   AST 15 12/29/2016   ALT 12 12/29/2016   PROT 6.6 12/29/2016   ALBUMIN 4.1 12/29/2016   CALCIUM 9.3 12/29/2016   GFR 119.18 12/29/2016   Lab Results  Component Value Date   CHOL 158 12/29/2016   Lab Results  Component Value Date   HDL 60.60 12/29/2016   Lab Results  Component Value Date   LDLCALC 92 12/29/2016   Lab Results  Component Value Date   TRIG 30.0 12/29/2016   Lab Results  Component Value Date   CHOLHDL 3 12/29/2016   No results found for: HGBA1C    Assessment & Plan:   Problem List Items Addressed This Visit      Cardiovascular and Mediastinum   Essential hypertension - Primary   Relevant Medications   benazepril (LOTENSIN) 20 MG tablet   Other Relevant Orders   Basic metabolic panel     Respiratory   Chronic sinusitis     Other   Class 2 obesity due to excess calories with body mass index (BMI) of 38.0 to 38.9 in adult   Relevant Medications   phentermine 30 MG capsule   Chronic pain of left knee      Meds ordered this encounter  Medications  .  benazepril (LOTENSIN) 20 MG tablet    Sig: TAKE 1 TABLET (20 MG TOTAL) BY MOUTH DAILY.    Dispense:  90 tablet    Refill:  0  . phentermine 30 MG capsule    Sig: Take 1 capsule (30 mg total) by mouth every morning.    Dispense:  30 capsule    Refill:  2    Follow-up: Return in about 3 months (around 01/03/2019).   We will check a BMP today regarding her blood pressure treatment with Lotensin.  She will follow-up in April for a physical.

## 2018-10-05 LAB — BASIC METABOLIC PANEL
BUN: 18 mg/dL (ref 6–23)
CO2: 28 meq/L (ref 19–32)
Calcium: 9.4 mg/dL (ref 8.4–10.5)
Chloride: 105 mEq/L (ref 96–112)
Creatinine, Ser: 0.76 mg/dL (ref 0.40–1.20)
GFR: 100.64 mL/min (ref >60.00)
GLUCOSE: 83 mg/dL (ref 70–99)
POTASSIUM: 4.3 meq/L (ref 3.5–5.1)
SODIUM: 139 meq/L (ref 135–145)

## 2018-12-30 ENCOUNTER — Encounter: Payer: Self-pay | Admitting: Family Medicine

## 2018-12-30 ENCOUNTER — Other Ambulatory Visit: Payer: Self-pay | Admitting: Family Medicine

## 2018-12-30 DIAGNOSIS — I10 Essential (primary) hypertension: Secondary | ICD-10-CM

## 2018-12-30 NOTE — Telephone Encounter (Signed)
Message sent to patient. Awaiting response.  

## 2019-01-03 ENCOUNTER — Telehealth (INDEPENDENT_AMBULATORY_CARE_PROVIDER_SITE_OTHER): Payer: Self-pay

## 2019-01-03 NOTE — Telephone Encounter (Signed)
Called patient and asked the screening questions.  Do you have now or have you had in the past 7 days a fever and/or chills? NO  Do you have now or have you had in the past 7 days a cough? NO  Do you have now or have you had in the last 7 days nausea, vomiting or abdominal pain? NO  Have you been exposed to anyone who has tested positive for COVID-19? NO  Have you or anyone who lives with you traveled within the last month? NO 

## 2019-01-04 ENCOUNTER — Ambulatory Visit (INDEPENDENT_AMBULATORY_CARE_PROVIDER_SITE_OTHER): Payer: BLUE CROSS/BLUE SHIELD | Admitting: Orthopaedic Surgery

## 2019-01-04 ENCOUNTER — Telehealth (INDEPENDENT_AMBULATORY_CARE_PROVIDER_SITE_OTHER): Payer: Self-pay

## 2019-01-04 ENCOUNTER — Encounter (INDEPENDENT_AMBULATORY_CARE_PROVIDER_SITE_OTHER): Payer: Self-pay | Admitting: Orthopaedic Surgery

## 2019-01-04 ENCOUNTER — Other Ambulatory Visit: Payer: Self-pay

## 2019-01-04 ENCOUNTER — Ambulatory Visit (INDEPENDENT_AMBULATORY_CARE_PROVIDER_SITE_OTHER): Payer: Self-pay

## 2019-01-04 DIAGNOSIS — M1712 Unilateral primary osteoarthritis, left knee: Secondary | ICD-10-CM

## 2019-01-04 MED ORDER — METHYLPREDNISOLONE ACETATE 40 MG/ML IJ SUSP
40.0000 mg | INTRAMUSCULAR | Status: AC | PRN
  Administered 2019-01-04: 10:00:00 40 mg via INTRA_ARTICULAR

## 2019-01-04 MED ORDER — LIDOCAINE HCL 1 % IJ SOLN
2.0000 mL | INTRAMUSCULAR | Status: AC | PRN
  Administered 2019-01-04: 2 mL

## 2019-01-04 MED ORDER — BUPIVACAINE HCL 0.25 % IJ SOLN
2.0000 mL | INTRAMUSCULAR | Status: AC | PRN
  Administered 2019-01-04: 2 mL via INTRA_ARTICULAR

## 2019-01-04 NOTE — Telephone Encounter (Signed)
Please submit for gel injection Left knee  Dr. Roda Shutters.

## 2019-01-04 NOTE — Progress Notes (Signed)
Office Visit Note   Patient: Hailey [name]           Date of Birth: June 12, 1961           MRN: [id number] Visit Date: 01/04/2019              Requested by: Mliss Sax, MD [address] [place], [place] [address] PCP: Mliss Sax, MD   Assessment & Plan: Visit Diagnoses:  1. Primary localized osteoarthritis of left knee     Plan: Impression is primary localized osteoarthritis left knee.  We will inject the left knee with cortisone today.  We will also send for approval for Visco supplementation injection.  She will follow-up with [place] once that has been completed.  Follow-Up Instructions: Return for once approved for visco.   Orders:  Orders Placed This Encounter  Procedures   Large Joint Inj: L knee   XR KNEE 3 VIEW LEFT   No orders of the defined types were placed in this encounter.     Procedures: Large Joint Inj: L knee on 01/04/2019 9:44 AM Indications: pain Details: 22 G needle, anterolateral approach Medications: 2 mL bupivacaine 0.25 %; 2 mL lidocaine 1 %; 40 mg methylPREDNISolone acetate 40 MG/ML      Clinical Data: No additional findings.   Subjective: Chief Complaint  Patient presents with   Left Knee - Pain    HPI patient is a pleasant 58 year old female who presents our clinic today with recurrent left knee pain.  History of osteoarthritis.  She has had cortisone and viscosupplementation injections in the past.  Her last injection was in July 2019.  This was a viscosupplementation.  She noticed some relief following the injection.  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  She has been up on her feet doing yard work and walking quite a bit which she thinks is been aggravating her knee.  Pain she has to the entire knee.  This is described as a constant ache.  Worse with activity.  She also notes cramping at times to the entire left leg when she is elevating her leg for an extended  period of time.  She does use CBD oil with moderate relief.  Review of Systems as detailed in HPI.  All others reviewed and are negative.   Objective: Vital Signs: There were no vitals taken for this visit.  Physical Exam well-developed well-nourished female no acute distress.  Alert and oriented x3.  Ortho Exam examination of her left knee shows a trace effusion.  Range of motion 0 to 115 degrees.  No joint line tenderness.  Moderate patellofemoral crepitus.  Ligaments are stable.  She is neurovascular intact distally.  Specialty Comments:  No specialty comments available.  Imaging: Xr Knee 3 View Left  Result Date: 01/04/2019 Moderate joint space narrowing medial and patellofemoral compartments consistent with progression of her arthrosis.    PMFS History: Patient Active Problem List   Diagnosis Date Noted   Chronic sinusitis 10/04/2018   Primary localized osteoarthritis of left knee 12/15/2017   CARPAL TUNNEL SYNDROME, BILATERAL 08/04/2010   GERD 03/06/2008   Chronic pain of left knee 09/14/2007   Class 2 obesity due to excess calories with body mass index (BMI) of 38.0 to 38.9 in adult 02/10/2007   Essential hypertension 02/10/2007   Past Medical History:  Diagnosis Date   CARPAL TUNNEL SYNDROME, BILATERAL 08/04/2010   GERD 03/06/2008   HYPERTENSION  02/10/2007   Obesity    OBESITY NOS 02/10/2007    History reviewed. No pertinent family history.  Past Surgical History:  Procedure Laterality Date   HEMORRHOID SURGERY     NASAL SINUS SURGERY     Social History   Occupational History   Not on file  Tobacco Use   Smoking status: Never Smoker   Smokeless tobacco: Never Used  Substance and Sexual Activity   Alcohol use: Yes    Comment: rarley   Drug use: No   Sexual activity: Not on file

## 2019-01-10 ENCOUNTER — Telehealth (INDEPENDENT_AMBULATORY_CARE_PROVIDER_SITE_OTHER): Payer: Self-pay | Admitting: Orthopaedic Surgery

## 2019-01-10 NOTE — Telephone Encounter (Signed)
Noted. Will submit for gel injection. 

## 2019-01-10 NOTE — Telephone Encounter (Signed)
See message.

## 2019-01-10 NOTE — Telephone Encounter (Signed)
Noted  

## 2019-01-10 NOTE — Telephone Encounter (Signed)
Patient called to f/u on the application for her gel injection.  CB#[phone].  Thank you.

## 2019-01-13 ENCOUNTER — Telehealth (INDEPENDENT_AMBULATORY_CARE_PROVIDER_SITE_OTHER): Payer: Self-pay | Admitting: Orthopaedic Surgery

## 2019-01-13 ENCOUNTER — Telehealth (INDEPENDENT_AMBULATORY_CARE_PROVIDER_SITE_OTHER): Payer: Self-pay

## 2019-01-13 NOTE — Telephone Encounter (Signed)
Patient called wanting to speak with Dr. Roda Shutters about her gel injection.  CB#[phone].  Thank you.

## 2019-01-13 NOTE — Telephone Encounter (Signed)
Has this been done yet?

## 2019-01-13 NOTE — Telephone Encounter (Signed)
Talked with patient concerning gel injection.  Will call patient once this has been approved.

## 2019-01-13 NOTE — Telephone Encounter (Signed)
Submitted VOB for SynviscOne, left knee. 

## 2019-01-20 ENCOUNTER — Telehealth (INDEPENDENT_AMBULATORY_CARE_PROVIDER_SITE_OTHER): Payer: Self-pay

## 2019-01-20 NOTE — Telephone Encounter (Signed)
PA required for SynviscOne, left knee. Faxed completed PA form to [employer] at [phone]. 

## 2019-01-25 ENCOUNTER — Telehealth (INDEPENDENT_AMBULATORY_CARE_PROVIDER_SITE_OTHER): Payer: Self-pay

## 2019-01-25 NOTE — Telephone Encounter (Signed)
Patient is approved for SynviscOne, left knee. Buy & Bill Covered at 100% through her insurance after Co-pay Co-pay of $65.00 ( required) PA required PA Approval#[id number] Valid 01/20/2019- 01/20/2020  Appt. 01/27/2019 with Dr. Roda Shutters.

## 2019-01-27 ENCOUNTER — Encounter (INDEPENDENT_AMBULATORY_CARE_PROVIDER_SITE_OTHER): Payer: Self-pay | Admitting: Orthopaedic Surgery

## 2019-01-27 ENCOUNTER — Other Ambulatory Visit: Payer: Self-pay

## 2019-01-27 ENCOUNTER — Ambulatory Visit (INDEPENDENT_AMBULATORY_CARE_PROVIDER_SITE_OTHER): Payer: BLUE CROSS/BLUE SHIELD | Admitting: Orthopaedic Surgery

## 2019-01-27 DIAGNOSIS — M1712 Unilateral primary osteoarthritis, left knee: Secondary | ICD-10-CM

## 2019-01-27 MED ORDER — BUPIVACAINE HCL 0.25 % IJ SOLN
2.0000 mL | INTRAMUSCULAR | Status: AC | PRN
  Administered 2019-01-27: 2 mL via INTRA_ARTICULAR

## 2019-01-27 MED ORDER — HYLAN G-F 20 48 MG/6ML IX SOSY
48.0000 mg | PREFILLED_SYRINGE | INTRA_ARTICULAR | Status: AC | PRN
  Administered 2019-01-27: 48 mg via INTRA_ARTICULAR

## 2019-01-27 MED ORDER — LIDOCAINE HCL 1 % IJ SOLN
2.0000 mL | INTRAMUSCULAR | Status: AC | PRN
  Administered 2019-01-27: 2 mL

## 2019-01-27 NOTE — Progress Notes (Signed)
   Procedure Note  Patient: Hailey [name]             Date of Birth: [birth year]           MRN: [id number]             Visit Date: 01/27/2019  Procedures: Visit Diagnoses: Unilateral primary osteoarthritis, left knee - Plan: Large Joint Inj: L knee  Large Joint Inj: L knee on 01/27/2019 9:21 AM Indications: pain Details: 22 G needle, anterolateral approach Medications: 2 mL bupivacaine 0.25 %; 48 mg Hylan 48 MG/6ML; 2 mL lidocaine 1 %

## 2019-01-30 ENCOUNTER — Other Ambulatory Visit: Payer: Self-pay | Admitting: Family Medicine

## 2019-01-30 DIAGNOSIS — E6609 Other obesity due to excess calories: Secondary | ICD-10-CM

## 2019-01-30 DIAGNOSIS — Z6838 Body mass index (BMI) 38.0-38.9, adult: Principal | ICD-10-CM

## 2019-01-30 NOTE — Telephone Encounter (Signed)
She was suppose to have a physical in April, but all of the physicals were canceled.

## 2019-02-24 ENCOUNTER — Other Ambulatory Visit: Payer: Self-pay

## 2019-02-24 ENCOUNTER — Encounter: Payer: Self-pay | Admitting: Family Medicine

## 2019-02-24 ENCOUNTER — Ambulatory Visit: Payer: BLUE CROSS/BLUE SHIELD | Admitting: Family Medicine

## 2019-02-24 VITALS — BP 120/78 | HR 74 | Temp 98.0°F | Ht 65.0 in | Wt 229.5 lb

## 2019-02-24 DIAGNOSIS — Z6838 Body mass index (BMI) 38.0-38.9, adult: Secondary | ICD-10-CM

## 2019-02-24 DIAGNOSIS — E6609 Other obesity due to excess calories: Secondary | ICD-10-CM | POA: Diagnosis not present

## 2019-02-24 DIAGNOSIS — Z131 Encounter for screening for diabetes mellitus: Secondary | ICD-10-CM | POA: Insufficient documentation

## 2019-02-24 DIAGNOSIS — I10 Essential (primary) hypertension: Secondary | ICD-10-CM

## 2019-02-24 DIAGNOSIS — Z0000 Encounter for general adult medical examination without abnormal findings: Secondary | ICD-10-CM | POA: Diagnosis not present

## 2019-02-24 LAB — CBC
HCT: 38.4 % (ref 36.0–46.0)
Hemoglobin: 13.2 g/dL (ref 12.0–15.0)
MCHC: 34.5 g/dL (ref 30.0–36.0)
MCV: 92.6 fl (ref 78.0–100.0)
Platelets: 384 10*3/uL (ref 150.0–400.0)
RBC: 4.14 Mil/uL (ref 3.87–5.11)
RDW: 14.9 % (ref 11.5–15.5)
WBC: 5.8 10*3/uL (ref 4.0–10.5)

## 2019-02-24 LAB — URINALYSIS, ROUTINE W REFLEX MICROSCOPIC
Bilirubin Urine: NEGATIVE
Hgb urine dipstick: NEGATIVE
Ketones, ur: NEGATIVE
Leukocytes,Ua: NEGATIVE
Nitrite: NEGATIVE
Specific Gravity, Urine: 1.015 (ref 1.000–1.030)
Total Protein, Urine: NEGATIVE
Urine Glucose: NEGATIVE
Urobilinogen, UA: 0.2 (ref 0.0–1.0)
pH: 6.5 (ref 5.0–8.0)

## 2019-02-24 LAB — TSH: TSH: 1.65 u[IU]/mL (ref 0.35–4.50)

## 2019-02-24 LAB — COMPREHENSIVE METABOLIC PANEL
ALT: 15 U/L (ref 0–35)
AST: 14 U/L (ref 0–37)
Albumin: 4.1 g/dL (ref 3.5–5.2)
Alkaline Phosphatase: 94 U/L (ref 39–117)
BUN: 13 mg/dL (ref 6–23)
CO2: 27 mEq/L (ref 19–32)
Calcium: 9.2 mg/dL (ref 8.4–10.5)
Chloride: 104 mEq/L (ref 96–112)
Creatinine, Ser: 0.69 mg/dL (ref 0.40–1.20)
GFR: 105.71 mL/min (ref >60.00)
Glucose, Bld: 81 mg/dL (ref 70–99)
Potassium: 4.5 mEq/L (ref 3.5–5.1)
Sodium: 139 mEq/L (ref 135–145)
Total Bilirubin: 0.4 mg/dL (ref 0.2–1.2)
Total Protein: 6.7 g/dL (ref 6.0–8.3)

## 2019-02-24 LAB — LIPID PANEL
Cholesterol: 159 mg/dL (ref 0–200)
HDL: 64.6 mg/dL (ref >39.00)
LDL Cholesterol: 84 mg/dL (ref 0–99)
NonHDL: 94.34
Total CHOL/HDL Ratio: 2
Triglycerides: 52 mg/dL (ref 0.0–149.0)
VLDL: 10.4 mg/dL (ref 0.0–40.0)

## 2019-02-24 LAB — LDL CHOLESTEROL, DIRECT: Direct LDL: 77 mg/dL

## 2019-02-24 LAB — VITAMIN D 25 HYDROXY (VIT D DEFICIENCY, FRACTURES): VITD: 41.95 ng/mL (ref 30.00–100.00)

## 2019-02-24 MED ORDER — BENAZEPRIL HCL 20 MG PO TABS: ORAL_TABLET | ORAL | 2 refills | Status: DC

## 2019-02-24 NOTE — Patient Instructions (Signed)
BMI for Adults  Body mass index (BMI) is a number that is calculated from a person's weight and height. BMI may help to estimate how much of a person's weight is composed of fat. BMI can help identify those who may be at higher risk for certain medical problems. How is BMI used with adults? BMI is used as a screening tool to identify possible weight problems. It is used to check whether a person is obese, overweight, healthy weight, or underweight. How is BMI calculated? BMI measures your weight and compares it to your height. This can be done either in [place] ([place] or metric measurements. Note that charts are available to help you find your BMI quickly and easily without having to do these calculations yourself. To calculate your BMI in English (U[place] measurements, your health care provider will: 1. Measure your weight in pounds (lb). 2. Multiply the number of pounds by 703. ? For example, for a person who weighs 180 lb, multiply that number by 703, which equals 126,540. 3. Measure your height in inches (in). Then multiply that number by itself to get a measurement called "inches squared." ? For example, for a person who is 70 in tall, the "inches squared" measurement is 70 in x 70 in, which equals 4900 inches squared. 4. Divide the total from Step 2 (number of lb x 703) by the total from Step 3 (inches squared): 126,540  4900 = 25.8. This is your BMI. To calculate your BMI in metric measurements, your health care provider will: 1. Measure your weight in kilograms (kg). 2. Measure your height in meters (m). Then multiply that number by itself to get a measurement called "meters squared." ? For example, for a person who is 1.75 m tall, the "meters squared" measurement is 1.75 m x 1.75 m, which is equal to 3.1 meters squared. 3. Divide the number of kilograms (your weight) by the meters squared number. In this example: 70  3.1 = 22.6. This is your BMI. How is BMI interpreted? To interpret your  results, your health care provider will use BMI charts to identify whether you are underweight, normal weight, overweight, or obese. The following guidelines will be used:  Underweight: BMI less than 18.5.  Normal weight: BMI between 18.5 and 24.9.  Overweight: BMI between 25 and 29.9.  Obese: BMI of 30 and above. Please note:  Weight includes both fat and muscle, so someone with a muscular build, such as an athlete, may have a BMI that is higher than 24.9. In cases like these, BMI is not an accurate measure of body fat.  To determine if excess body fat is the cause of a BMI of 25 or higher, further assessments may need to be done by a health care provider.  BMI is usually interpreted in the same way for men and women. Why is BMI a useful tool? BMI is useful in two ways:  Identifying a weight problem that may be related to a medical condition, or that may increase the risk for medical problems.  Promoting lifestyle and diet changes in order to reach a healthy weight. Summary  Body mass index (BMI) is a number that is calculated from a person's weight and height.  BMI may help to estimate how much of a person's weight is composed of fat. BMI can help identify those who may be at higher risk for certain medical problems.  BMI can be measured using English measurements or metric measurements.  To interpret your results, your  health care provider will use BMI charts to identify whether you are underweight, normal weight, overweight, or obese. This information is not intended to replace advice given to you by your health care provider. Make sure you discuss any questions you have with your health care provider. Document Released: 05/26/2004 Document Revised: 07/28/2017 Document Reviewed: 07/28/2017 Elsevier Interactive Patient Education  2019 [employer].  Exercising to [employer] Exercise is structured, repetitive physical activity to improve fitness and health. Getting regular  exercise is important for everyone. It is especially important if you are overweight. Being overweight increases your risk of heart disease, stroke, diabetes, high blood pressure, and several types of cancer. Reducing your calorie intake and exercising can help you lose weight. Exercise is usually categorized as moderate or vigorous intensity. To lose weight, most people need to do a certain amount of moderate-intensity or vigorous-intensity exercise each week. Moderate-intensity exercise  Moderate-intensity exercise is any activity that gets you moving enough to burn at least three times more energy (calories) than if you were sitting. Examples of moderate exercise include:  Walking a mile in 15 minutes.  Doing light yard work.  Biking at an easy pace. Most people should get at least 150 minutes (2 hours and 30 minutes) a week of moderate-intensity exercise to maintain their body weight. Vigorous-intensity exercise Vigorous-intensity exercise is any activity that gets you moving enough to burn at least six times more calories than if you were sitting. When you exercise at this intensity, you should be working hard enough that you are not able to carry on a conversation. Examples of vigorous exercise include:  Running.  Playing a team sport, such as football, basketball, and soccer.  Jumping rope. Most people should get at least 75 minutes (1 hour and 15 minutes) a week of vigorous-intensity exercise to maintain their body weight. How can exercise affect me? When you exercise enough to burn more calories than you eat, you lose weight. Exercise also reduces body fat and builds muscle. The more muscle you have, the more calories you burn. Exercise also:  Improves mood.  Reduces stress and tension.  Improves your overall fitness, flexibility, and endurance.  Increases bone strength. The amount of exercise you need to lose weight depends on:  Your age.  The type of exercise.  Any  health conditions you have.  Your overall physical ability. Talk to your health care provider about how much exercise you need and what types of activities are safe for you. What actions can I take to lose weight? Nutrition   Make changes to your diet as told by your health care provider or diet and nutrition specialist (dietitian). This may include: ? Eating fewer calories. ? Eating more protein. ? Eating less unhealthy fats. ? Eating a diet that includes fresh fruits and vegetables, whole grains, low-fat dairy products, and lean protein. ? Avoiding foods with added fat, salt, and sugar.  Drink plenty of water while you exercise to prevent dehydration or heat stroke. Activity  Choose an activity that you enjoy and set realistic goals. Your health care provider can help you make an exercise plan that works for you.  Exercise at a moderate or vigorous intensity most days of the week. ? The intensity of exercise may vary from person to person. You can tell how intense a workout is for you by paying attention to your breathing and heartbeat. Most people will notice their breathing and heartbeat get faster with more intense exercise.  Do resistance training  twice each week, such as: ? Push-ups. ? Sit-ups. ? Lifting weights. ? Using resistance bands.  Getting short amounts of exercise can be just as helpful as long structured periods of exercise. If you have trouble finding time to exercise, try to include exercise in your daily routine. ? Get up, stretch, and walk around every 30 minutes throughout the day. ? Go for a walk during your lunch break. ? Park your car farther away from your destination. ? If you take public transportation, get off one stop early and walk the rest of the way. ? Make phone calls while standing up and walking around. ? Take the stairs instead of elevators or escalators.  Wear comfortable clothes and shoes with good support.  Do not exercise so much that  you hurt yourself, feel dizzy, or get very short of breath. Where to find more information  U.S. Department of Health and Human Services: ThisPath.fi  Centers for Disease Control and Prevention (CDC): FootballExhibition.com.br Contact a health care provider:  Before starting a new exercise program.  If you have questions or concerns about your weight.  If you have a medical problem that keeps you from exercising. Get help right away if you have any of the following while exercising:  Injury.  Dizziness.  Difficulty breathing or shortness of breath that does not go away when you stop exercising.  Chest pain.  Rapid heartbeat. Summary  Being overweight increases your risk of heart disease, stroke, diabetes, high blood pressure, and several types of cancer.  Losing weight happens when you burn more calories than you eat.  Reducing the amount of calories you eat in addition to getting regular moderate or vigorous exercise each week helps you lose weight. This information is not intended to replace advice given to you by your health care provider. Make sure you discuss any questions you have with your health care provider. Document Released: 10/17/2010 Document Revised: 09/27/2017 Document Reviewed: 09/27/2017 Elsevier Interactive Patient Education  2019 Else[employer] Inc.  DASH Eating Plan DASH stands for "Dietary Approaches to Stop Hypertension." The DASH eating plan is a healthy eating plan that has been shown to reduce high blood pressure (hypertension). It may also reduce your risk for type 2 diabetes, heart disease, and stroke. The DASH eating plan may also help with weight loss. What are tips for following this plan?  General guidelines  Avoid eating more than 2,300 mg (milligrams) of salt (sodium) a day. If you have hypertension, you may need to reduce your sodium intake to 1,500 mg a day.  Limit alcohol intake to no more than 1 drink a day for nonpregnant women and 2 drinks a day for  men. One drink equals 12 oz of beer, 5 oz of wine, or 1 oz of hard liquor.  Work with your health care provider to maintain a healthy body weight or to lose weight. Ask what an ideal weight is for you.  Get at least 30 minutes of exercise that causes your heart to beat faster (aerobic exercise) most days of the week. Activities may include walking, swimming, or biking.  Work with your health care provider or diet and nutrition specialist (dietitian) to adjust your eating plan to your individual calorie needs. Reading food labels   Check food labels for the amount of sodium per serving. Choose foods with less than 5 percent of the Daily Value of sodium. Generally, foods with less than 300 mg of sodium per serving fit into this eating plan.  To  find whole grains, look for the word "whole" as the first word in the ingredient list. Shopping  Buy products labeled as "low-sodium" or "no salt added."  Buy fresh foods. Avoid canned foods and premade or frozen meals. Cooking  Avoid adding salt when cooking. Use salt-free seasonings or herbs instead of table salt or sea salt. Check with your health care provider or pharmacist before using salt substitutes.  Do not fry foods. Cook foods using healthy methods such as baking, boiling, grilling, and broiling instead.  Cook with heart-healthy oils, such as olive, canola, soybean, or sunflower oil. Meal planning  Eat a balanced diet that includes: ? 5 or more servings of fruits and vegetables each day. At each meal, try to fill half of your plate with fruits and vegetables. ? Up to 6-8 servings of whole grains each day. ? Less than 6 oz of lean meat, poultry, or fish each day. A 3-oz serving of meat is about the same size as a deck of cards. One egg equals 1 oz. ? 2 servings of low-fat dairy each day. ? A serving of nuts, seeds, or beans 5 times each week. ? Heart-healthy fats. Healthy fats called Omega-3 fatty acids are found in foods such as  flaxseeds and coldwater fish, like sardines, salmon, and mackerel.  Limit how much you eat of the following: ? Canned or prepackaged foods. ? Food that is high in trans fat, such as fried foods. ? Food that is high in saturated fat, such as fatty meat. ? Sweets, desserts, sugary drinks, and other foods with added sugar. ? Full-fat dairy products.  Do not salt foods before eating.  Try to eat at least 2 vegetarian meals each week.  Eat more home-cooked food and less restaurant, buffet, and fast food.  When eating at a restaurant, ask that your food be prepared with less salt or no salt, if possible. What foods are recommended? The items listed may not be a complete list. Talk with your dietitian about what dietary choices are best for you. Grains Whole-grain or whole-wheat bread. Whole-grain or whole-wheat pasta. Brown rice. Orpah Cobb. Bulgur. Whole-grain and low-sodium cereals. Pita bread. Low-fat, low-sodium crackers. Whole-wheat flour tortillas. Vegetables Fresh or frozen vegetables (raw, steamed, roasted, or grilled). Low-sodium or reduced-sodium tomato and vegetable juice. Low-sodium or reduced-sodium tomato sauce and tomato paste. Low-sodium or reduced-sodium canned vegetables. Fruits All fresh, dried, or frozen fruit. Canned fruit in natural juice (without added sugar). Meat and other protein foods Skinless chicken or [place]. Ground chicken or [place]. Pork with fat trimmed off. Fish and seafood. Egg whites. Dried beans, peas, or lentils. Unsalted nuts, nut butters, and seeds. Unsalted canned beans. Lean cuts of beef with fat trimmed off. Low-sodium, lean deli meat. Dairy Low-fat (1%) or fat-free (skim) milk. Fat-free, low-fat, or reduced-fat cheeses. Nonfat, low-sodium ricotta or cottage cheese. Low-fat or nonfat yogurt. Low-fat, low-sodium cheese. Fats and oils Soft margarine without trans fats. Vegetable oil. Low-fat, reduced-fat, or light mayonnaise and salad dressings  (reduced-sodium). Canola, safflower, olive, soybean, and sunflower oils. Avocado. Seasoning and other foods Herbs. Spices. Seasoning mixes without salt. Unsalted popcorn and pretzels. Fat-free sweets. What foods are not recommended? The items listed may not be a complete list. Talk with your dietitian about what dietary choices are best for you. Grains Baked goods made with fat, such as croissants, muffins, or some breads. Dry pasta or rice meal packs. Vegetables Creamed or fried vegetables. Vegetables in a cheese sauce. Regular canned vegetables (not low-sodium  or reduced-sodium). Regular canned tomato sauce and paste (not low-sodium or reduced-sodium). Regular tomato and vegetable juice (not low-sodium or reduced-sodium). Rosita Fire. Olives. Fruits Canned fruit in a light or heavy syrup. Fried fruit. Fruit in cream or butter sauce. Meat and other protein foods Fatty cuts of meat. Ribs. Fried meat. Tomasa Blase. Sausage. Bologna and other processed lunch meats. Salami. Fatback. Hotdogs. Bratwurst. Salted nuts and seeds. Canned beans with added salt. Canned or smoked fish. Whole eggs or egg yolks. Chicken or [place] with skin. Dairy Whole or 2% milk, cream, and half-and-half. Whole or full-fat cream cheese. Whole-fat or sweetened yogurt. Full-fat cheese. Nondairy creamers. Whipped toppings. Processed cheese and cheese spreads. Fats and oils Butter. Stick margarine. Lard. Shortening. Ghee. Bacon fat. Tropical oils, such as coconut, palm kernel, or palm oil. Seasoning and other foods Salted popcorn and pretzels. Onion salt, garlic salt, seasoned salt, table salt, and sea salt. Worcestershire sauce. Tartar sauce. Barbecue sauce. Teriyaki sauce. Soy sauce, including reduced-sodium. Steak sauce. Canned and packaged gravies. Fish sauce. Oyster sauce. Cocktail sauce. Horseradish that you find on the shelf. Ketchup. Mustard. Meat flavorings and tenderizers. Bouillon cubes. Hot sauce and Tabasco sauce. Premade or  packaged marinades. Premade or packaged taco seasonings. Relishes. Regular salad dressings. Where to find more information:  National Heart, Lung, and Blood Institute: PopSteam.is  American Heart Association: www.heart.org Summary  The DASH eating plan is a healthy eating plan that has been shown to reduce high blood pressure (hypertension). It may also reduce your risk for type 2 diabetes, heart disease, and stroke.  With the DASH eating plan, you should limit salt (sodium) intake to 2,300 mg a day. If you have hypertension, you may need to reduce your sodium intake to 1,500 mg a day.  When on the DASH eating plan, aim to eat more fresh fruits and vegetables, whole grains, lean proteins, low-fat dairy, and heart-healthy fats.  Work with your health care provider or diet and nutrition specialist (dietitian) to adjust your eating plan to your individual calorie needs. This information is not intended to replace advice given to you by your health care provider. Make sure you discuss any questions you have with your health care provider. Document Released: 09/03/2011 Document Revised: 09/07/2016 Document Reviewed: 09/07/2016 Elsevier Interactive Patient Education  2019 [employer].  Mediterranean Diet A Mediterranean diet refers to food and lifestyle choices that are based on the traditions of countries located on the [place]. This way of eating has been shown to help prevent certain conditions and improve outcomes for people who have chronic diseases, like kidney disease and heart disease. What are tips for following this plan? Lifestyle  Cook and eat meals together with your family, when possible.  Drink enough fluid to keep your urine clear or pale yellow.  Be physically active every day. This includes: ? Aerobic exercise like running or swimming. ? Leisure activities like gardening, walking, or housework.  Get 7-8 hours of sleep each night.  If recommended by your  health care provider, drink red wine in moderation. This means 1 glass a day for nonpregnant women and 2 glasses a day for men. A glass of wine equals 5 oz (150 mL). Reading food labels   Check the serving size of packaged foods. For foods such as rice and pasta, the serving size refers to the amount of cooked product, not dry.  Check the total fat in packaged foods. Avoid foods that have saturated fat or trans fats.  Check the ingredients list for  added sugars, such as corn syrup. Shopping  At the grocery store, buy most of your food from the areas near the walls of the store. This includes: ? Fresh fruits and vegetables (produce). ? Grains, beans, nuts, and seeds. Some of these may be available in unpackaged forms or large amounts (in bulk). ? Fresh seafood. ? Poultry and eggs. ? Low-fat dairy products.  Buy whole ingredients instead of prepackaged foods.  Buy fresh fruits and vegetables in-season from local farmers markets.  Buy frozen fruits and vegetables in resealable bags.  If you do not have access to quality fresh seafood, buy precooked frozen shrimp or canned fish, such as tuna, salmon, or sardines.  Buy small amounts of raw or cooked vegetables, salads, or olives from the deli or salad bar at your store.  Stock your pantry so you always have certain foods on hand, such as olive oil, canned tuna, canned tomatoes, rice, pasta, and beans. Cooking  Cook foods with extra-virgin olive oil instead of using butter or other vegetable oils.  Have meat as a side dish, and have vegetables or grains as your main dish. This means having meat in small portions or adding small amounts of meat to foods like pasta or stew.  Use beans or vegetables instead of meat in common dishes like chili or lasagna.  Experiment with different cooking methods. Try roasting or broiling vegetables instead of steaming or sauteing them.  Add frozen vegetables to soups, stews, pasta, or rice.  Add  nuts or seeds for added healthy fat at each meal. You can add these to yogurt, salads, or vegetable dishes.  Marinate fish or vegetables using olive oil, lemon juice, garlic, and fresh herbs. Meal planning   Plan to eat 1 vegetarian meal one day each week. Try to work up to 2 vegetarian meals, if possible.  Eat seafood 2 or more times a week.  Have healthy snacks readily available, such as: ? Vegetable sticks with hummus. ? [place] yogurt. ? Fruit and nut trail mix.  Eat balanced meals throughout the week. This includes: ? Fruit: 2-3 servings a day ? Vegetables: 4-5 servings a day ? Low-fat dairy: 2 servings a day ? Fish, poultry, or lean meat: 1 serving a day ? Beans and legumes: 2 or more servings a week ? Nuts and seeds: 1-2 servings a day ? Whole grains: 6-8 servings a day ? Extra-virgin olive oil: 3-4 servings a day  Limit red meat and sweets to only a few servings a month What are my food choices?  Mediterranean diet ? Recommended ? Grains: Whole-grain pasta. Brown rice. [place] wheat. Polenta. Couscous. Whole-wheat bread. Orpah Cobb. ? Vegetables: Artichokes. Beets. Broccoli. Cabbage. Carrots. Eggplant. Green beans. Chard. Kale. Spinach. Onions. Leeks. Peas. Squash. Tomatoes. Peppers. Radishes. ? Fruits: Apples. Apricots. Avocado. Berries. Bananas. Cherries. Dates. Figs. Grapes. Lemons. Melon. Oranges. Peaches. Plums. Pomegranate. ? Meats and other protein foods: Beans. Almonds. Sunflower seeds. Pine nuts. Peanuts. Cod. Salmon. Scallops. Shrimp. Tuna. Tilapia. Clams. Oysters. Eggs. ? Dairy: Low-fat milk. Cheese. Greek yogurt. ? Beverages: Water. Red wine. Herbal tea. ? Fats and oils: Extra virgin olive oil. Avocado oil. Grape seed oil. ? Sweets and desserts: [place] yogurt with honey. Baked apples. Poached pears. Trail mix. ? Seasoning and other foods: Basil. Cilantro. Coriander. Cumin. Mint. Parsley. Sage. Rosemary. Tarragon. Garlic. Oregano. Thyme. Pepper. Balsalmic  vinegar. Tahini. Hummus. Tomato sauce. Olives. Mushrooms. ? Limit these ? Grains: Prepackaged pasta or rice dishes. Prepackaged cereal with added sugar. ? Vegetables: Deep  fried potatoes (french fries). ? Fruits: Fruit canned in syrup. ? Meats and other protein foods: Beef. Pork. Lamb. Poultry with skin. Hot dogs. Tomasa Blase. ? Dairy: Ice cream. Sour cream. Whole milk. ? Beverages: Juice. Sugar-sweetened soft drinks. Beer. Liquor and spirits. ? Fats and oils: Butter. Canola oil. Vegetable oil. Beef fat (tallow). Lard. ? Sweets and desserts: Cookies. Cakes. Pies. Candy. ? Seasoning and other foods: Mayonnaise. Premade sauces and marinades. ? The items listed may not be a complete list. Talk with your dietitian about what dietary choices are right for you. Summary  The Mediterranean diet includes both food and lifestyle choices.  Eat a variety of fresh fruits and vegetables, beans, nuts, seeds, and whole grains.  Limit the amount of red meat and sweets that you eat.  Talk with your health care provider about whether it is safe for you to drink red wine in moderation. This means 1 glass a day for nonpregnant women and 2 glasses a day for men. A glass of wine equals 5 oz (150 mL). This information is not intended to replace advice given to you by your health care provider. Make sure you discuss any questions you have with your health care provider. Document Released: 05/07/2016 Document Revised: 06/09/2016 Document Reviewed: 05/07/2016 Elsevier Interactive Patient Education  2019 Elsevier Inc.  Laparoscopic Gastric Band Surgery  Laparoscopic gastric band surgery is a procedure to help you to lose weight (bariatric surgery). During this procedure, a band (gastric band) is put around the stomach. This makes the upper part of the stomach into a small pouch that can hold only a small amount of food. The lower, bigger part of the stomach is below the band. The two parts stay connected by a small  opening between them. Food goes through the opening to the lower part of the stomach more slowly than before surgery. This means that you need less food to make you feel full. The gastric band has an expandable balloon on its inner side. The balloon may be inflated to make the stomach smaller, or deflated to make it larger. Your gastric band will be adjusted during follow-up visits to allow you to control your hunger and help you lose weight safely. You may have this procedure if:  You are obese and you have not been able to lose weight by changing your diet and getting more physical activity.  You have health problems that are related to obesity, such as: ? Type 2 diabetes (diabetes mellitus). ? Heart disease. ? Lung disease. Tell a health care provider about:  Any allergies you have.  All medicines you are taking, including vitamins, herbs, eye drops, creams, and over-the-counter medicines.  Any problems you or family members have had with anesthetic medicines.  Any blood disorders you have.  Any surgeries you have had.  Any medical conditions you have.  Whether you are pregnant or may be pregnant. What are the risks? Generally, this is a safe procedure. However, problems may occur, including:  Bleeding.  Infection.  Allergic reactions to medicines.  Injury to the intestines, stomach, or other organs. This is rare.  Stomach ulcers.  Problems with the tube that connects the band to the stomach, causing the band to slip out of place. This is rare, and it would need to be fixed with another procedure.  Vomiting.  Trouble swallowing.  Not getting enough nutrients for your body (malnutrition).  Problems with the gastric band, such as the band wearing down over time.  Heartburn.  Stomach inflammation (gastritis).  Lack of weight loss.  Small, hard deposits in the gallbladder (gallstones). What happens before the procedure? Medicines  Ask your health care provider  about: ? Changing or stopping your regular medicines. This is especially important if you are taking diabetes medicines or blood thinners. ? Taking medicines such as aspirin and ibuprofen. These medicines can thin your blood. Do not take these medicines unless your health care provider tells you to take them. ? Taking over-the-counter medicines, vitamins, herbs, and supplements.  You may be given antibiotic medicine to help prevent infection. Staying hydrated Follow instructions from your health care provider about hydration, which may include:  Up to 2 hours before the procedure - you may continue to drink clear liquids, such as water, clear fruit juice, black coffee, and plain tea. Eating and drinking restrictions Follow instructions from your health care provider about eating and drinking, which may include:  8 hours before the procedure - stop eating heavy meals or foods such as meat, fried foods, or fatty foods.  6 hours before the procedure - stop eating light meals or foods, such as toast or cereal.  6 hours before the procedure - stop drinking milk or drinks that contain milk.  2 hours before the procedure - stop drinking clear liquids. General instructions  Do not use any products that contain nicotine or tobacco, such as cigarettes and e-cigarettes. If you need help quitting, ask your health care provider.  You may be asked to shower with a germ-killing soap.  You may have tests, such as: ? Blood tests. ? A gallbladder ultrasound.  Manage your weight as told by your health care provider. It is important to not gain weight before the procedure.  Plan to have someone take you home from the hospital or clinic.  Plan to have a responsible adult care for you for at least 24 hours after you leave the hospital or clinic. This is important. What happens during the procedure?  An IV will be inserted into one of your veins.  You will be given a medicine to make you fall asleep  (general anesthetic). You may also be given a medicine to help you relax (sedative).  Several small incisions will be made in your abdomen.  A laparoscope will be placed through one of the incisions. Surgical instruments will be placed through the other incisions.  A gastric band will be placed around the upper part of your stomach.  A tube (port) will be placed underneath your abdominal skin and connected to the gastric band. The port can be used to access the gastric band at a later time, to adjust the size.  Your incisions will be closed with stitches (sutures) or staples and covered with a bandage (dressing). The procedure may vary among health care providers and hospitals. What happens after the procedure?  Your blood pressure, heart rate, breathing rate, and blood oxygen level will be monitored until the medicines you were given have worn off.  You may feel the port under the skin of your abdomen. This is normal.  You will take a test called a barium swallow. This involves swallowing a liquid (barium) that shows up clearly on X-rays. X-rays will be taken while you are in different positions. The X-rays will show if the band is in the right place.  Do not drive for 24 hours if you were given a sedative, or until your health care provider approves. Summary  Laparoscopic gastric band surgery is a procedure  to help you to lose weight. During this procedure, a band (gastric band) is put around the upper part of the stomach.  Do not gain weight before your procedure. This is important.  The balloon on the gastric band can be inflated to make your stomach smaller, or deflated to make it larger. This information is not intended to replace advice given to you by your health care provider. Make sure you discuss any questions you have with your health care provider. Document Released: 06/05/2015 Document Revised: 07/14/2017 Document Reviewed: 07/14/2017 Elsevier Interactive Patient Education   2019 [employer]

## 2019-02-24 NOTE — Progress Notes (Signed)
Established Patient Office Visit  Subjective:  Patient ID: Hailey [name], female    DOB: [birth year]  Age: 58 y.o. MRN: [id number]  CC:  Chief Complaint  Patient presents with  . Follow-up    HPI Har[place] P Elling presents for follow-up of her hypertension and obesity.  Blood pressures been well controlled with the Lotensin and she denies any problems taking the drug.  Denies cough.  She was able to lose a few pounds with the phentermine but her weight is essentially where it was at the last visit.  Out of work and [employer] since March 13.  Says that she has been active at home working around the house and out in the yard.  She is receiving collagen injections into her left knee for arthritis that has developed in that knee over the years.  Says that she has tried medical weight loss management in the past.  Past Medical History:  Diagnosis Date  . CARPAL TUNNEL SYNDROME, BILATERAL 08/04/2010  . GERD 03/06/2008  . HYPERTENSION 02/10/2007  . Obesity   . OBESITY NOS 02/10/2007    Past Surgical History:  Procedure Laterality Date  . HEMORRHOID SURGERY    . NASAL SINUS SURGERY      History reviewed. No pertinent family history.  Social History   Socioeconomic History  . Marital status: Married    Spouse name: Not on file  . Number of children: Not on file  . Years of education: Not on file  . Highest education level: Not on file  Occupational History  . Not on file  Social Needs  . Financial resource strain: Not on file  . Food insecurity:    Worry: Not on file    Inability: Not on file  . Transportation needs:    Medical: Not on file    Non-medical: Not on file  Tobacco Use  . Smoking status: Never Smoker  . Smokeless tobacco: Never Used  Substance and Sexual Activity  . Alcohol use: Yes    Comment: rarley  . Drug use: No  . Sexual activity: Not on file  Lifestyle  . Physical activity:    Days per week: Not on file    Minutes per session: Not on file  . Stress:  Not on file  Relationships  . Social connections:    Talks on phone: Not on file    Gets together: Not on file    Attends religious service: Not on file    Active member of club or organization: Not on file    Attends meetings of clubs or organizations: Not on file    Relationship status: Not on file  . Intimate partner violence:    Fear of current or ex partner: Not on file    Emotionally abused: Not on file    Physically abused: Not on file    Forced sexual activity: Not on file  Other Topics Concern  . Not on file  Social History Narrative  . Not on file    Outpatient Medications Prior to Visit  Medication Sig Dispense Refill  . benazepril (LOTENSIN) 20 MG tablet TAKE 1 TABLET BY MOUTH EVERY DAY 90 tablet 0  . meloxicam (MOBIC) 7.5 MG tablet TAKE 1 TABLET BY MOUTH TWICE A DAY AS NEEDED FOR PAIN 30 tablet 2  . phentermine 30 MG capsule Take 1 capsule (30 mg total) by mouth every morning. 30 capsule 2  . VOLTAREN 1 % GEL APPLY 2 GRAMS TO AFFECTED AREA 4  TIMES A DAY 100 g 5   No facility-administered medications prior to visit.     Allergies  Allergen Reactions  . Sulfamethoxazole     REACTION: unspecified    ROS Review of Systems  Constitutional: Negative for chills, fatigue, fever and unexpected weight change.  Eyes: Negative for photophobia and visual disturbance.  Respiratory: Negative.   Cardiovascular: Negative.   Gastrointestinal: Negative.   Endocrine: Negative for polyphagia and polyuria.  Genitourinary: Negative.   Musculoskeletal: Positive for arthralgias.  Allergic/Immunologic: Negative for immunocompromised state.  Hematological: Does not bruise/bleed easily.  Psychiatric/Behavioral: Negative.       Objective:    Physical Exam  Constitutional: She is oriented to person, place, and time. She appears well-developed and well-nourished. No distress.  HENT:  Head: Normocephalic and atraumatic.  Right Ear: External ear normal.  Left Ear: External ear  normal.  Mouth/Throat: Oropharynx is clear and moist. No oropharyngeal exudate.  Eyes: Conjunctivae are normal. Right eye exhibits no discharge. Left eye exhibits no discharge. No scleral icterus.  Neck: No JVD present. No tracheal deviation present. No thyromegaly present.  Cardiovascular: Normal rate, regular rhythm and normal heart sounds.  Pulmonary/Chest: Effort normal and breath sounds normal. No stridor.  Musculoskeletal:        General: No edema.  Lymphadenopathy:    She has no cervical adenopathy.  Neurological: She is alert and oriented to person, place, and time.  Skin: She is not diaphoretic.  Psychiatric: She has a normal mood and affect. Her behavior is normal.    BP 120/78   Pulse 74   Temp 98 F (36.7 C) (Oral)   Ht 5\' 5"  (1.651 m)   Wt 229 lb 8 oz (104.1 kg)   SpO2 99%   BMI 38.19 kg/m  Wt Readings from Last 3 Encounters:  02/24/19 229 lb 8 oz (104.1 kg)  10/04/18 229 lb (103.9 kg)  07/22/17 226 lb 9.6 oz (102.8 kg)   BP Readings from Last 3 Encounters:  02/24/19 120/78  10/04/18 120/70  07/22/17 100/78   Guideline developer:  UpToDate (see UpToDate for funding source) Date Released: June 2014  Health Maintenance Due  Topic Date Due  . Hepatitis C Screening  July 16, 1961  . HIV Screening  02/02/1976    There are no preventive care reminders to display for this patient.  Lab Results  Component Value Date   TSH 1.49 12/29/2016   Lab Results  Component Value Date   WBC 4.6 12/29/2016   HGB 12.4 12/29/2016   HCT 36.8 12/29/2016   MCV 91.1 12/29/2016   PLT 314.0 12/29/2016   Lab Results  Component Value Date   NA 139 10/04/2018   K 4.3 10/04/2018   CO2 28 10/04/2018   GLUCOSE 83 10/04/2018   BUN 18 10/04/2018   CREATININE 0.76 10/04/2018   BILITOT 0.4 12/29/2016   ALKPHOS 90 12/29/2016   AST 15 12/29/2016   ALT 12 12/29/2016   PROT 6.6 12/29/2016   ALBUMIN 4.1 12/29/2016   CALCIUM 9.4 10/04/2018   GFR 100.64 10/04/2018   Lab Results   Component Value Date   CHOL 158 12/29/2016   Lab Results  Component Value Date   HDL 60.60 12/29/2016   Lab Results  Component Value Date   LDLCALC 92 12/29/2016   Lab Results  Component Value Date   TRIG 30.0 12/29/2016   Lab Results  Component Value Date   CHOLHDL 3 12/29/2016   No results found for: HGBA1C    Assessment &  Plan:   Problem List Items Addressed This Visit      Cardiovascular and Mediastinum   Essential hypertension - Primary   Relevant Orders   CBC   Comprehensive metabolic panel   Urinalysis, Routine w reflex microscopic   TSH     Other   Class 2 obesity due to excess calories with body mass index (BMI) of 38.0 to 38.9 in adult   Relevant Orders   VITAMIN D 25 Hydroxy (Vit-D Deficiency, Fractures)   TSH   Health care maintenance   Relevant Orders   CBC   Comprehensive metabolic panel   LDL cholesterol, direct   Lipid panel      No orders of the defined types were placed in this encounter.   Follow-up: Return in about 6 months (around 08/27/2019).   Patient was given information on BMI for adults, exercising to lose weight, Mediterranean diet laparoscopic gastric band surgery.  Do not believe that phentermine is a long-term solution for her problem.  Believe that medical loss management and/or a surgical consult would be a better alternative for her.  She said that she did not feel as though she were sick enough for that alternative.  Pointed out that her with the arthritis in her knee probably related to her weight at least to some extent.  She is nonfasting today.  Cholesterol has not been checked in over 2 years.

## 2019-06-27 ENCOUNTER — Ambulatory Visit (INDEPENDENT_AMBULATORY_CARE_PROVIDER_SITE_OTHER): Payer: BC Managed Care – PPO | Admitting: Orthopaedic Surgery

## 2019-06-27 ENCOUNTER — Encounter: Payer: Self-pay | Admitting: Orthopaedic Surgery

## 2019-06-27 ENCOUNTER — Telehealth: Payer: Self-pay

## 2019-06-27 DIAGNOSIS — M1712 Unilateral primary osteoarthritis, left knee: Secondary | ICD-10-CM | POA: Diagnosis not present

## 2019-06-27 DIAGNOSIS — Z1231 Encounter for screening mammogram for malignant neoplasm of breast: Secondary | ICD-10-CM | POA: Diagnosis not present

## 2019-06-27 DIAGNOSIS — Z01419 Encounter for gynecological examination (general) (routine) without abnormal findings: Secondary | ICD-10-CM | POA: Diagnosis not present

## 2019-06-27 DIAGNOSIS — Z6839 Body mass index (BMI) 39.0-39.9, adult: Secondary | ICD-10-CM | POA: Diagnosis not present

## 2019-06-27 DIAGNOSIS — Z803 Family history of malignant neoplasm of breast: Secondary | ICD-10-CM | POA: Diagnosis not present

## 2019-06-27 MED ORDER — TRAMADOL HCL 50 MG PO TABS: 50.0000 mg | ORAL_TABLET | Freq: Three times a day (TID) | ORAL | 0 refills | Status: DC | PRN

## 2019-06-27 NOTE — Telephone Encounter (Signed)
Noted  

## 2019-06-27 NOTE — Progress Notes (Signed)
Office Visit Note   Patient: Hailey [name]           Date of Birth: 08/11/1961           MRN: [id number] Visit Date: 06/27/2019              Requested by: Libby Maw, MD [address] [place],  [place] [address] PCP: Libby Maw, MD   Assessment & Plan: Visit Diagnoses:  1. Unilateral primary osteoarthritis, left knee     Plan: Impression is end-stage degenerative joint disease left knee.  The patient did not respond well to the cortisone injection and does not want to repeat this.  She would like to get approval for the viscosupplementation injection, but is aware that she will likely not be approved until 07/30/2019.  We will call her once she is approved.  At that point, we will further discuss timing of the total knee replacement.  I have also agreed to call in a prescription of tramadol to use as needed. This patient is diagnosed with osteoarthritis of the knee(s).    Radiographs show evidence of joint space narrowing, osteophytes, subchondral sclerosis and/or subchondral cysts.  This patient has knee pain which interferes with functional and activities of daily living.    This patient has experienced inadequate response, adverse effects and/or intolerance with conservative treatments such as acetaminophen, NSAIDS, topical creams, physical therapy or regular exercise, knee bracing and/or weight loss.   This patient has experienced inadequate response or has a contraindication to intra articular steroid injections for at least 3 months.   This patient is not scheduled to have a total knee replacement within 6 months of starting treatment with viscosupplementation.   Follow-Up Instructions: Return for once approved for left knee gel injection.   Orders:  No orders of the defined types were placed in this encounter.  Meds ordered this encounter  Medications  . traMADol (ULTRAM) 50 MG tablet    Sig: Take 1 tablet (50 mg total) by mouth 3 (three)  times daily as needed.    Dispense:  30 tablet    Refill:  0      Procedures: No procedures performed   Clinical Data: No additional findings.   Subjective: Chief Complaint  Patient presents with  . Left Knee - Pain, Follow-up    HPI patient is a pleasant 58 year old female who presents our clinic today with recurrent left knee pain.  History of end-stage generative joint disease.  She was seen in our office where she underwent left knee cortisone injection on 01/04/2019.  She had minimal relief following the injection.  She was seen again on 5 1 where she had a viscosupplementation injection.  She notes that this seemed to improve her symptoms but recently her pain has started to return and worsen.  The pain she has is to the entire knee.  Worse when she is on her feet all day standing as well as at the end of the day.  She has been taking Tylenol arthritis in the morning with mild relief of symptoms.  She would like to proceed with definitive treatment with total knee replacement but needs to wait till later this fall due to schedule conflicts at work.  Review of Systems as detailed in HPI.  All others reviewed and are negative.   Objective: Vital Signs: There were no vitals taken for this visit.  Physical Exam well-developed well-nourished female in no acute distress.  Alert and oriented x3.  Ortho Exam stable exam of the left knee  Specialty Comments:  No specialty comments available.  Imaging: No new imaging   PMFS History: Patient Active Problem List   Diagnosis Date Noted  . Health care maintenance 02/24/2019  . Chronic sinusitis 10/04/2018  . Unilateral primary osteoarthritis, left knee 12/15/2017  . CARPAL TUNNEL SYNDROME, BILATERAL 08/04/2010  . GERD 03/06/2008  . Chronic pain of left knee 09/14/2007  . Class 2 obesity due to excess calories with body mass index (BMI) of 38.0 to 38.9 in adult 02/10/2007  . Essential hypertension 02/10/2007   Past Medical  History:  Diagnosis Date  . CARPAL TUNNEL SYNDROME, BILATERAL 08/04/2010  . GERD 03/06/2008  . HYPERTENSION 02/10/2007  . Obesity   . OBESITY NOS 02/10/2007    History reviewed. No pertinent family history.  Past Surgical History:  Procedure Laterality Date  . HEMORRHOID SURGERY    . NASAL SINUS SURGERY     Social History   Occupational History  . Not on file  Tobacco Use  . Smoking status: Never Smoker  . Smokeless tobacco: Never Used  Substance and Sexual Activity  . Alcohol use: Yes    Comment: rarley  . Drug use: No  . Sexual activity: Not on file

## 2019-06-27 NOTE — Telephone Encounter (Signed)
LEFT KNEE GEL INJECTION

## 2019-07-04 ENCOUNTER — Telehealth: Payer: Self-pay

## 2019-07-04 NOTE — Telephone Encounter (Signed)
Submitted VOB for SynviscOne, left knee. 

## 2019-07-12 ENCOUNTER — Telehealth: Payer: Self-pay

## 2019-07-12 ENCOUNTER — Ambulatory Visit (INDEPENDENT_AMBULATORY_CARE_PROVIDER_SITE_OTHER): Payer: BC Managed Care – PPO | Admitting: Orthopaedic Surgery

## 2019-07-12 ENCOUNTER — Encounter: Payer: Self-pay | Admitting: Orthopaedic Surgery

## 2019-07-12 ENCOUNTER — Ambulatory Visit: Payer: Self-pay

## 2019-07-12 ENCOUNTER — Other Ambulatory Visit: Payer: Self-pay

## 2019-07-12 DIAGNOSIS — M25561 Pain in right knee: Secondary | ICD-10-CM | POA: Diagnosis not present

## 2019-07-12 DIAGNOSIS — G8929 Other chronic pain: Secondary | ICD-10-CM

## 2019-07-12 MED ORDER — METHYLPREDNISOLONE ACETATE 40 MG/ML IJ SUSP
40.0000 mg | INTRAMUSCULAR | Status: AC | PRN
  Administered 2019-07-12: 40 mg via INTRA_ARTICULAR

## 2019-07-12 MED ORDER — LIDOCAINE HCL 1 % IJ SOLN
2.0000 mL | INTRAMUSCULAR | Status: AC | PRN
  Administered 2019-07-12: 2 mL

## 2019-07-12 MED ORDER — BUPIVACAINE HCL 0.25 % IJ SOLN
2.0000 mL | INTRAMUSCULAR | Status: AC | PRN
  Administered 2019-07-12: 2 mL via INTRA_ARTICULAR

## 2019-07-12 NOTE — Progress Notes (Signed)
Office Visit Note   Patient: Hailey [name]           Date of Birth: 1961-04-21           MRN: [id number] Visit Date: 07/12/2019              Requested by: Mliss Sax, MD [address] [place],  [place] [address] PCP: Mliss Sax, MD   Assessment & Plan: Visit Diagnoses:  1. Chronic pain of right knee     Plan: Impression is right knee osteoarthritis.  We will inject the right knee with cortisone today.  She will follow-up with [place] as needed for the right knee.   Follow-Up Instructions: Return for once approved for left knee visco injection.   Orders:  Orders Placed This Encounter  Procedures  . Large Joint Inj: R knee  . XR Knee 1-2 Views Right   No orders of the defined types were placed in this encounter.     Procedures: Large Joint Inj: R knee on 07/12/2019 3:50 PM Indications: pain Details: 22 G needle, anterolateral approach Medications: 2 mL bupivacaine 0.25 %; 2 mL lidocaine 1 %; 40 mg methylPREDNISolone acetate 40 MG/ML      Clinical Data: No additional findings.   Subjective: Chief Complaint  Patient presents with  . Right Knee - Pain    HPI patient is a pleasant 58 year old female who presents to our clinic today with new onset of right knee pain.  This has been ongoing for the past few weeks that she has increased her exercise trying to lose weight to proceed with left total knee replacement.  The pain she has is to the anteromedial aspect.  It is aggravated with walking and standing.  She has tried Tylenol and diclofenac without significant relief of symptoms.  No previous cortisone injection to the right knee.  Review of Systems as detailed in HPI.  All others reviewed and are negative.   Objective: Vital Signs: There were no vitals taken for this visit.  Physical Exam well-developed well-nourished female no acute distress.  Alert and oriented x3.  Ortho Exam examination of the right knee reveals a trace  effusion.  Range of motion 0 to 110 degrees.  Marked tenderness medial joint line.  Mild tenderness lateral joint line.  She is stable valgus varus stress.  She is neurovascular intact distally.  Specialty Comments:  No specialty comments available.  Imaging: Xr Knee 1-2 Views Right  Result Date: 07/12/2019 Moderate joint space narrowing medial and patellofemoral compartments    PMFS History: Patient Active Problem List   Diagnosis Date Noted  . Health care maintenance 02/24/2019  . Chronic sinusitis 10/04/2018  . Unilateral primary osteoarthritis, left knee 12/15/2017  . CARPAL TUNNEL SYNDROME, BILATERAL 08/04/2010  . GERD 03/06/2008  . Chronic pain of left knee 09/14/2007  . Class 2 obesity due to excess calories with body mass index (BMI) of 38.0 to 38.9 in adult 02/10/2007  . Essential hypertension 02/10/2007   Past Medical History:  Diagnosis Date  . CARPAL TUNNEL SYNDROME, BILATERAL 08/04/2010  . GERD 03/06/2008  . HYPERTENSION 02/10/2007  . Obesity   . OBESITY NOS 02/10/2007    History reviewed. No pertinent family history.  Past Surgical History:  Procedure Laterality Date  . HEMORRHOID SURGERY    . NASAL SINUS SURGERY     Social History   Occupational History  . Not on file  Tobacco Use  . Smoking status: Never Smoker  . Smokeless  tobacco: Never Used  Substance and Sexual Activity  . Alcohol use: Yes    Comment: rarley  . Drug use: No  . Sexual activity: Not on file

## 2019-07-12 NOTE — Telephone Encounter (Signed)
PA required for SynviscOne, left knee. Fax completed PA form to [facility] at [phone].  Appt.has to be after 07/30/2019 due to last injection being on 01/27/2019.

## 2019-07-18 ENCOUNTER — Other Ambulatory Visit: Payer: Self-pay | Admitting: Obstetrics and Gynecology

## 2019-07-18 DIAGNOSIS — Z803 Family history of malignant neoplasm of breast: Secondary | ICD-10-CM

## 2019-07-28 ENCOUNTER — Encounter: Payer: Self-pay | Admitting: Radiology

## 2019-07-28 ENCOUNTER — Telehealth: Payer: Self-pay | Admitting: Orthopaedic Surgery

## 2019-07-28 NOTE — Telephone Encounter (Signed)
Patient called asked if the gel injection have arrived yet for her knee injection? The number to contact patient is [phone]

## 2019-07-28 NOTE — Telephone Encounter (Signed)
Sending to you

## 2019-07-28 NOTE — Telephone Encounter (Signed)
Will you call her and schedule an appt for Synvisc One?  We have these in stock.  She will have a $65 copay, PA is done.  Thanks.

## 2019-07-28 NOTE — Progress Notes (Unsigned)
Synvisc One authorized # [id number], valid 07/30/19 thru 07/29/20. Per Marchelle L at [facility] [phone].

## 2019-07-31 NOTE — Telephone Encounter (Signed)
Message Sent in Error 

## 2019-08-01 ENCOUNTER — Other Ambulatory Visit: Payer: Self-pay | Admitting: Physician Assistant

## 2019-08-09 ENCOUNTER — Other Ambulatory Visit: Payer: Self-pay

## 2019-08-09 ENCOUNTER — Encounter: Payer: Self-pay | Admitting: Orthopaedic Surgery

## 2019-08-09 ENCOUNTER — Ambulatory Visit: Payer: BC Managed Care – PPO | Admitting: Orthopaedic Surgery

## 2019-08-09 DIAGNOSIS — M1712 Unilateral primary osteoarthritis, left knee: Secondary | ICD-10-CM

## 2019-08-09 MED ORDER — LIDOCAINE HCL 1 % IJ SOLN
5.0000 mL | INTRAMUSCULAR | Status: AC | PRN
  Administered 2019-08-09: 5 mL

## 2019-08-09 MED ORDER — HYLAN G-F 20 48 MG/6ML IX SOSY
48.0000 mg | PREFILLED_SYRINGE | INTRA_ARTICULAR | Status: AC | PRN
  Administered 2019-08-09: 48 mg via INTRA_ARTICULAR

## 2019-08-09 MED ORDER — TRAMADOL HCL 50 MG PO TABS: 50.0000 mg | ORAL_TABLET | Freq: Every day | ORAL | 2 refills | Status: DC | PRN

## 2019-08-09 NOTE — Progress Notes (Signed)
   Procedure Note  Patient: Hailey [name]             Date of Birth: 03/10/1961           MRN: [id number]             Visit Date: 08/09/2019  Procedures: Visit Diagnoses:  1. Unilateral primary osteoarthritis, left knee     Large Joint Inj: L knee on 08/09/2019 3:12 PM Indications: pain Details: 22 G needle  Arthrogram: No  Medications: 48 mg Hylan 48 MG/6ML; 5 mL lidocaine 1 % Outcome: tolerated well, no immediate complications Patient was prepped and draped in the usual sterile fashion.     Continue using voltaren gel, knee brace, tramadol as needed for breakthrough pain.  Will call when she's ready for TKA.

## 2019-08-15 DIAGNOSIS — M79642 Pain in left hand: Secondary | ICD-10-CM | POA: Diagnosis not present

## 2019-08-15 DIAGNOSIS — M7989 Other specified soft tissue disorders: Secondary | ICD-10-CM | POA: Diagnosis not present

## 2019-08-23 ENCOUNTER — Ambulatory Visit: Payer: BC Managed Care – PPO | Admitting: Orthopaedic Surgery

## 2019-09-06 ENCOUNTER — Other Ambulatory Visit: Payer: Self-pay

## 2019-09-06 ENCOUNTER — Ambulatory Visit: Payer: Self-pay

## 2019-09-06 ENCOUNTER — Ambulatory Visit: Payer: BC Managed Care – PPO | Admitting: Orthopaedic Surgery

## 2019-09-06 ENCOUNTER — Encounter: Payer: Self-pay | Admitting: Orthopaedic Surgery

## 2019-09-06 DIAGNOSIS — M1712 Unilateral primary osteoarthritis, left knee: Secondary | ICD-10-CM

## 2019-09-06 NOTE — Progress Notes (Signed)
Office Visit Note   Patient: Hailey [name]           Date of Birth: [birth year]           MRN: [id number] Visit Date: 09/06/2019              Requested by: Mliss Sax, MD [address] [place],  [place] [address] PCP: Mliss Sax, MD   Assessment & Plan: Visit Diagnoses:  1. Primary osteoarthritis of left knee     Plan: Impression is end-stage left knee DJD.  At this point patient has failed to get relief from nonsurgical treatments.  Based on discussion today patient has elected to proceed with scheduling for left total knee replacement in the future.  Risk benefits alternatives discussed today.  Rehab and recovery reviewed today.  We also discussed that she could potentially be a candidate for same-day discharge postoperatively with close follow-up with home health PT the next day as well as CPM.  Follow-Up Instructions: Return for 2 week postop visit.   Orders:  Orders Placed This Encounter  Procedures  . XR KNEE 3 VIEW LEFT   No orders of the defined types were placed in this encounter.     Procedures: No procedures performed   Clinical Data: No additional findings.   Subjective: Chief Complaint  Patient presents with  . Left Knee - Follow-up    [name] returns today for continued left knee pain.  She had viscosupplementation 4 weeks ago exactly and she states that she is just not getting the relief that she is hoping for.  She continues to have severe difficulty with ADLs and is unable to walk a city block without severe pain.  She has chronic nighttime pain as well.  She is looking for more permanent solution such as a knee replacement.   Review of Systems  Constitutional: Negative.   HENT: Negative.   Eyes: Negative.   Respiratory: Negative.   Cardiovascular: Negative.   Endocrine: Negative.   Musculoskeletal: Negative.   Neurological: Negative.   Hematological: Negative.   Psychiatric/Behavioral: Negative.   All  other systems reviewed and are negative.    Objective: Vital Signs: There were no vitals taken for this visit.  Physical Exam Vitals signs and nursing note reviewed.  Constitutional:      Appearance: She is well-developed.  Pulmonary:     Effort: Pulmonary effort is normal.  Skin:    General: Skin is warm.     Capillary Refill: Capillary refill takes less than 2 seconds.  Neurological:     Mental Status: She is alert and oriented to person, place, and time.  Psychiatric:        Behavior: Behavior normal.        Thought Content: Thought content normal.        Judgment: Judgment normal.     Ortho Exam Left knee exam unchanged. Specialty Comments:  No specialty comments available.  Imaging: Xr Knee 3 View Left  Result Date: 09/06/2019 Bone on bone medial compartment joint space narrowing.  Tricompartmental DJD.    PMFS History: Patient Active Problem List   Diagnosis Date Noted  . Primary osteoarthritis of left knee 09/06/2019  . Health care maintenance 02/24/2019  . Chronic sinusitis 10/04/2018  . Unilateral primary osteoarthritis, left knee 12/15/2017  . CARPAL TUNNEL SYNDROME, BILATERAL 08/04/2010  . GERD 03/06/2008  . Chronic pain of left knee 09/14/2007  . Class 2 obesity due to excess calories with body mass  index (BMI) of 38.0 to 38.9 in adult 02/10/2007  . Essential hypertension 02/10/2007   Past Medical History:  Diagnosis Date  . CARPAL TUNNEL SYNDROME, BILATERAL 08/04/2010  . GERD 03/06/2008  . HYPERTENSION 02/10/2007  . Obesity   . OBESITY NOS 02/10/2007    History reviewed. No pertinent family history.  Past Surgical History:  Procedure Laterality Date  . HEMORRHOID SURGERY    . NASAL SINUS SURGERY     Social History   Occupational History  . Not on file  Tobacco Use  . Smoking status: Never Smoker  . Smokeless tobacco: Never Used  Substance and Sexual Activity  . Alcohol use: Yes    Comment: rarley  . Drug use: No  . Sexual activity:  Not on file

## 2019-10-03 ENCOUNTER — Other Ambulatory Visit: Payer: Self-pay

## 2019-10-11 NOTE — Progress Notes (Signed)
[id number] [place], [place] - [address] [place] [address] Phone: [phone] Fax: [phone]      Your procedure is scheduled on Monday, 10/16/19.   Report to [facility] Main Entrance "A" at 0530 A.M., and check in at the Admitting office.  Call this number if you have problems the morning of surgery: [phone]  Call [phone] if you have any questions prior to your surgery date Monday-Friday 8am-4pm    Remember:  Do not eat after midnight the night before your surgery  You may drink clear liquids until 0415 the morning of your surgery.   Clear liquids allowed are: Water, Non-Citrus Juices (without pulp), Carbonated Beverages, Clear Tea, Black Coffee Only, and Gatorade    Take these medicines the morning of surgery with A SIP OF WATER:  Tramadol (Ultram) - as needed   7 days prior to surgery STOP taking any Voltaren, Aspirin (unless otherwise instructed by your surgeon), Aleve, Naproxen, Ibuprofen, Motrin, Advil, Goody's, BC's, all herbal medications, fish oil, and all vitamins.   Enhanced Recovery after Surgery for Orthopedics Enhanced Recovery after Surgery is a protocol used to improve the stress on your body and your recovery after surgery.  Patient Instructions  . The night before surgery:  o No food after midnight. ONLY clear liquids after midnight  .  Marland Kitchen The day of surgery (if you do NOT have diabetes):  o Drink ONE (1) Pre-Surgery Clear Ensure as directed.   o This drink was given to you during your hospital  pre-op appointment visit. o The pre-op nurse will instruct you on the time to drink the  Pre-Surgery Ensure depending on your surgery time. o Finish the drink by 0415 am morning of surgery o Nothing else to drink after completing the  Pre-Surgery Clear Ensure.         If you have questions, please contact your surgeon's office.    The Morning of Surgery  Do not wear jewelry, make-up or nail polish.  Do  not wear lotions, powders, or perfumes, or deodorant  Do not shave 48 hours prior to surgery  Do not bring valuables to the hospital  [facility] is not responsible for any belongings or valuables.  If you are a smoker, DO NOT Smoke 24 hours prior to surgery  If you wear a CPAP at night please bring your mask, tubing, and machine the morning of surgery   Remember that you must have someone to transport you home after your surgery, and remain with you for 24 hours if you are discharged the same day.   Please bring cases for contacts, glasses, hearing aids, dentures or bridgework because it cannot be worn into surgery.    Leave your suitcase in the car.  After surgery it may be brought to your room.  For patients admitted to the hospital, discharge time will be determined by your treatment team.  Patients discharged the day of surgery will not be allowed to drive home.    Special instructions:   [facility]- Preparing For Surgery  Before surgery, you can play an important role. Because skin is not sterile, your skin needs to be as free of germs as possible. You can reduce the number of germs on your skin by washing with CHG (chlorahexidine gluconate) Soap before surgery.  CHG is an antiseptic cleaner which kills germs and bonds with the skin to continue killing germs even after washing.    Oral Hygiene is also important  to reduce your risk of infection.  Remember - BRUSH YOUR TEETH THE MORNING OF SURGERY WITH YOUR REGULAR TOOTHPASTE  Please do not use if you have an allergy to CHG or antibacterial soaps. If your skin becomes reddened/irritated stop using the CHG.  Do not shave (including legs and underarms) for at least 48 hours prior to first CHG shower. It is OK to shave your face.  Please follow these instructions carefully.   1. Shower the NIGHT BEFORE SURGERY and the MORNING OF SURGERY with CHG Soap.   2. If you chose to wash your hair, wash your hair first as usual with your  normal shampoo.  3. After you shampoo, rinse your hair and body thoroughly to remove the shampoo.  4. Use CHG as you would any other liquid soap. You can apply CHG directly to the skin and wash gently with a scrungie or a clean washcloth.   5. Apply the CHG Soap to your body ONLY FROM THE NECK DOWN.  Do not use on open wounds or open sores. Avoid contact with your eyes, ears, mouth and genitals (private parts). Wash Face and genitals (private parts)  with your normal soap.   6. Wash thoroughly, paying special attention to the area where your surgery will be performed.  7. Thoroughly rinse your body with warm water from the neck down.  8. DO NOT shower/wash with your normal soap after using and rinsing off the CHG Soap.  9. Pat yourself dry with a CLEAN TOWEL.  10. Wear CLEAN PAJAMAS to bed the night before surgery, wear comfortable clothes the morning of surgery  11. Place CLEAN SHEETS on your bed the night of your first shower and DO NOT SLEEP WITH PETS.    Day of Surgery:  Please shower the morning of surgery with the CHG soap Do not apply any deodorants/lotions. Please wear clean clothes to the hospital/surgery center.   Remember to brush your teeth WITH YOUR REGULAR TOOTHPASTE.   Please read over the following fact sheets that you were given.

## 2019-10-12 ENCOUNTER — Other Ambulatory Visit: Payer: Self-pay

## 2019-10-12 ENCOUNTER — Other Ambulatory Visit (HOSPITAL_COMMUNITY)
Admission: RE | Admit: 2019-10-12 | Discharge: 2019-10-12 | Disposition: A | Discharge status: Home / Self Care | Payer: BC Managed Care – PPO | Source: Ambulatory Visit | Attending: Orthopaedic Surgery | Admitting: Orthopaedic Surgery

## 2019-10-12 ENCOUNTER — Encounter (HOSPITAL_COMMUNITY)
Admission: RE | Admit: 2019-10-12 | Discharge: 2019-10-12 | Disposition: A | Discharge status: Home / Self Care | Payer: BC Managed Care – PPO | Source: Ambulatory Visit | Attending: Orthopaedic Surgery | Admitting: Orthopaedic Surgery

## 2019-10-12 ENCOUNTER — Encounter (HOSPITAL_COMMUNITY): Payer: Self-pay

## 2019-10-12 ENCOUNTER — Encounter (HOSPITAL_COMMUNITY): Admission: RE | Admit: 2019-10-12 | Payer: BC Managed Care – PPO | Source: Ambulatory Visit

## 2019-10-12 DIAGNOSIS — Z01818 Encounter for other preprocedural examination: Secondary | ICD-10-CM | POA: Diagnosis present

## 2019-10-12 DIAGNOSIS — M1712 Unilateral primary osteoarthritis, left knee: Secondary | ICD-10-CM | POA: Diagnosis not present

## 2019-10-12 HISTORY — DX: Unspecified osteoarthritis, unspecified site: M19.90

## 2019-10-12 LAB — CBC WITH DIFFERENTIAL/PLATELET
Abs Immature Granulocytes: 0.02 10*3/uL (ref 0.00–0.07)
Basophils Absolute: 0.1 10*3/uL (ref 0.0–0.1)
Basophils Relative: 1 %
Eosinophils Absolute: 0.1 10*3/uL (ref 0.0–0.5)
Eosinophils Relative: 3 %
HCT: 40.1 % (ref 36.0–46.0)
Hemoglobin: 13.2 g/dL (ref 12.0–15.0)
Immature Granulocytes: 0 %
Lymphocytes Relative: 24 %
Lymphs Abs: 1.2 10*3/uL (ref 0.7–4.0)
MCH: 30.3 pg (ref 26.0–34.0)
MCHC: 32.9 g/dL (ref 30.0–36.0)
MCV: 92 fL (ref 80.0–100.0)
Monocytes Absolute: 0.5 10*3/uL (ref 0.1–1.0)
Monocytes Relative: 11 %
Neutro Abs: 3 10*3/uL (ref 1.7–7.7)
Neutrophils Relative %: 61 %
Platelets: 343 10*3/uL (ref 150–400)
RBC: 4.36 MIL/uL (ref 3.87–5.11)
RDW: 12.8 % (ref 11.5–15.5)
WBC: 5 10*3/uL (ref 4.0–10.5)
nRBC: 0 % (ref 0.0–0.2)

## 2019-10-12 LAB — APTT: aPTT: 36 seconds (ref 24–36)

## 2019-10-12 LAB — COMPREHENSIVE METABOLIC PANEL
ALT: 17 U/L (ref 0–44)
AST: 20 U/L (ref 15–41)
Albumin: 4 g/dL (ref 3.5–5.0)
Alkaline Phosphatase: 90 U/L (ref 38–126)
Anion gap: 10 (ref 5–15)
BUN: 12 mg/dL (ref 6–20)
CO2: 25 mmol/L (ref 22–32)
Calcium: 9.5 mg/dL (ref 8.9–10.3)
Chloride: 104 mmol/L (ref 98–111)
Creatinine, Ser: 0.66 mg/dL (ref 0.44–1.00)
GFR calc Af Amer: >60 mL/min (ref >60)
GFR calc non Af Amer: >60 mL/min (ref >60)
Glucose, Bld: 88 mg/dL (ref 70–99)
Potassium: 4.8 mmol/L (ref 3.5–5.1)
Sodium: 139 mmol/L (ref 135–145)
Total Bilirubin: 0.8 mg/dL (ref 0.3–1.2)
Total Protein: 7.3 g/dL (ref 6.5–8.1)

## 2019-10-12 LAB — TYPE AND SCREEN
ABO/RH(D): O NEG
Antibody Screen: NEGATIVE

## 2019-10-12 LAB — SURGICAL PCR SCREEN
MRSA, PCR: NEGATIVE
Staphylococcus aureus: NEGATIVE

## 2019-10-12 LAB — PROTIME-INR
INR: 1.1 (ref 0.8–1.2)
Prothrombin Time: 13.7 seconds (ref 11.4–15.2)

## 2019-10-12 LAB — ABO/RH: ABO/RH(D): O NEG

## 2019-10-12 NOTE — Progress Notes (Signed)
PCP - Nadene Rubins MD   Chest x-ray - 10/12/19 EKG - 10/12/19   ERAS Protcol - yes  PRE-SURGERY Ensure or G2- ensure given  COVID TEST- 10/12/19   Anesthesia review: no  Patient denies shortness of breath, fever, cough and chest pain at PAT appointment   All instructions explained to the patient, with a verbal understanding of the material. Patient agrees to go over the instructions while at home for a better understanding. Patient also instructed to self quarantine after being tested for COVID-19. The opportunity to ask questions was provided.

## 2019-10-13 ENCOUNTER — Telehealth: Payer: Self-pay | Admitting: *Deleted

## 2019-10-13 ENCOUNTER — Other Ambulatory Visit: Payer: Self-pay | Admitting: *Deleted

## 2019-10-13 DIAGNOSIS — M1712 Unilateral primary osteoarthritis, left knee: Secondary | ICD-10-CM

## 2019-10-13 LAB — NOVEL CORONAVIRUS, NAA (HOSP ORDER, SEND-OUT TO REF LAB; TAT 18-24 HRS): SARS-CoV-2, NAA: NOT DETECTED

## 2019-10-13 MED ORDER — TRANEXAMIC ACID 1000 MG/10ML IV SOLN
2000.0000 mg | INTRAVENOUS | Status: AC
  Administered 2019-10-16: 2000 mg via TOPICAL
  Filled 2019-10-13 (×3): qty 20

## 2019-10-13 MED ORDER — BUPIVACAINE LIPOSOME 1.3 % IJ SUSP
20.0000 mL | Freq: Once | INTRAMUSCULAR | Status: DC
  Filled 2019-10-13: qty 20

## 2019-10-13 NOTE — Telephone Encounter (Signed)
RNCM call to patient to discuss her upcoming Left TKA with Dr. Roda Shutters on 10/16/19. Discussed that CM has been requested to assist with any needs related to her surgery and the possibility of same day discharge. Explained Dr. Roda Shutters would like for her to have a CPM for home use after surgery as well as anticipated HHPT. Patient will need a FWW and 3in1 as well. She has good support at home. Referral made to [employer] for all DME, which will be delivered to her home today, with CPM set up. Choice provided for HHPT. Referral made to [facility]. OPPT referral made and scheduled for 11/03/19 at 10:15 am with [facility] location. F/U already scheduled. Will continue to follow for any further CM needs.

## 2019-10-14 ENCOUNTER — Other Ambulatory Visit: Payer: Self-pay | Admitting: Orthopaedic Surgery

## 2019-10-14 MED ORDER — OXYCODONE-ACETAMINOPHEN 5-325 MG PO TABS: 1.0000 | ORAL_TABLET | Freq: Three times a day (TID) | ORAL | 0 refills | Status: DC | PRN

## 2019-10-14 MED ORDER — ASPIRIN EC 81 MG PO TBEC: 81.0000 mg | DELAYED_RELEASE_TABLET | Freq: Two times a day (BID) | ORAL | 0 refills | Status: DC

## 2019-10-14 MED ORDER — ONDANSETRON HCL 4 MG PO TABS: 4.0000 mg | ORAL_TABLET | Freq: Three times a day (TID) | ORAL | 0 refills | Status: DC | PRN

## 2019-10-14 MED ORDER — METHOCARBAMOL 750 MG PO TABS: 750.0000 mg | ORAL_TABLET | Freq: Two times a day (BID) | ORAL | 3 refills | Status: DC | PRN

## 2019-10-14 MED ORDER — OXYCODONE HCL ER 10 MG PO T12A: 10.0000 mg | EXTENDED_RELEASE_TABLET | Freq: Two times a day (BID) | ORAL | 0 refills | Status: AC

## 2019-10-14 MED ORDER — KETOROLAC TROMETHAMINE 10 MG PO TABS: 10.0000 mg | ORAL_TABLET | Freq: Two times a day (BID) | ORAL | 0 refills | Status: DC | PRN

## 2019-10-14 NOTE — Discharge Instructions (Signed)

## 2019-10-15 NOTE — Anesthesia Preprocedure Evaluation (Addendum)
Anesthesia Evaluation  Patient identified by MRN, date of birth, ID band Patient awake    Reviewed: Allergy & Precautions, NPO status , Patient's Chart, lab work & pertinent test results  Airway Mallampati: II  TM Distance: >3 FB Neck ROM: Full    Dental  (+) Dental Advisory Given   Pulmonary neg pulmonary ROS,    breath sounds clear to auscultation       Cardiovascular hypertension, Pt. on medications  Rhythm:Regular Rate:Normal     Neuro/Psych negative neurological ROS     GI/Hepatic Neg liver ROS, GERD  ,  Endo/Other  negative endocrine ROS  Renal/GU negative Renal ROS     Musculoskeletal  (+) Arthritis ,   Abdominal   Peds  Hematology negative hematology ROS (+)   Anesthesia Other Findings   Reproductive/Obstetrics                            Lab Results  Component Value Date   WBC 5.0 10/12/2019   HGB 13.2 10/12/2019   HCT 40.1 10/12/2019   MCV 92.0 10/12/2019   PLT 343 10/12/2019   Lab Results  Component Value Date   CREATININE 0.66 10/12/2019   BUN 12 10/12/2019   NA 139 10/12/2019   K 4.8 10/12/2019   CL 104 10/12/2019   CO2 25 10/12/2019   Lab Results  Component Value Date   INR 1.1 10/12/2019   INR 1.1 06/11/2008    Anesthesia Physical Anesthesia Plan  ASA: II  Anesthesia Plan: Spinal   Post-op Pain Management:  Regional for Post-op pain   Induction:   PONV Risk Score and Plan: 2 and Propofol infusion, Ondansetron and Treatment may vary due to age or medical condition  Airway Management Planned: Natural Airway and Simple Face Mask  Additional Equipment:   Intra-op Plan:   Post-operative Plan:   Informed Consent: I have reviewed the patients History and Physical, chart, labs and discussed the procedure including the risks, benefits and alternatives for the proposed anesthesia with the patient or authorized representative who has indicated his/her  understanding and acceptance.       Plan Discussed with: CRNA  Anesthesia Plan Comments:        Anesthesia Quick Evaluation

## 2019-10-16 ENCOUNTER — Ambulatory Visit (HOSPITAL_COMMUNITY): Payer: BC Managed Care – PPO | Admitting: Anesthesiology

## 2019-10-16 ENCOUNTER — Encounter (HOSPITAL_COMMUNITY): Payer: Self-pay | Admitting: Orthopaedic Surgery

## 2019-10-16 ENCOUNTER — Observation Stay (HOSPITAL_COMMUNITY): Payer: BC Managed Care – PPO

## 2019-10-16 ENCOUNTER — Encounter (HOSPITAL_COMMUNITY)
Admission: RE | Disposition: A | Discharge status: Home / Self Care | Payer: Self-pay | Source: Home / Self Care | Attending: Orthopaedic Surgery

## 2019-10-16 ENCOUNTER — Other Ambulatory Visit: Payer: Self-pay

## 2019-10-16 ENCOUNTER — Observation Stay (HOSPITAL_COMMUNITY)
Admission: RE | Admit: 2019-10-16 | Discharge: 2019-10-16 | Disposition: A | Discharge status: Home / Self Care | Payer: BC Managed Care – PPO | Attending: Orthopaedic Surgery | Admitting: Orthopaedic Surgery

## 2019-10-16 DIAGNOSIS — Z6838 Body mass index (BMI) 38.0-38.9, adult: Secondary | ICD-10-CM | POA: Insufficient documentation

## 2019-10-16 DIAGNOSIS — Z7982 Long term (current) use of aspirin: Secondary | ICD-10-CM | POA: Insufficient documentation

## 2019-10-16 DIAGNOSIS — M1712 Unilateral primary osteoarthritis, left knee: Secondary | ICD-10-CM | POA: Diagnosis present

## 2019-10-16 DIAGNOSIS — K219 Gastro-esophageal reflux disease without esophagitis: Secondary | ICD-10-CM | POA: Insufficient documentation

## 2019-10-16 DIAGNOSIS — Z882 Allergy status to sulfonamides status: Secondary | ICD-10-CM | POA: Insufficient documentation

## 2019-10-16 DIAGNOSIS — E669 Obesity, unspecified: Secondary | ICD-10-CM | POA: Diagnosis not present

## 2019-10-16 DIAGNOSIS — R2681 Unsteadiness on feet: Secondary | ICD-10-CM | POA: Diagnosis not present

## 2019-10-16 DIAGNOSIS — Z791 Long term (current) use of non-steroidal anti-inflammatories (NSAID): Secondary | ICD-10-CM | POA: Diagnosis not present

## 2019-10-16 DIAGNOSIS — Z96652 Presence of left artificial knee joint: Secondary | ICD-10-CM

## 2019-10-16 DIAGNOSIS — I10 Essential (primary) hypertension: Secondary | ICD-10-CM | POA: Insufficient documentation

## 2019-10-16 DIAGNOSIS — Z79899 Other long term (current) drug therapy: Secondary | ICD-10-CM | POA: Diagnosis not present

## 2019-10-16 HISTORY — PX: TOTAL KNEE ARTHROPLASTY: SHX125

## 2019-10-16 SURGERY — ARTHROPLASTY, KNEE, TOTAL
Anesthesia: Spinal | Site: Knee | Laterality: Left

## 2019-10-16 MED ORDER — ONDANSETRON HCL 4 MG/2ML IJ SOLN
INTRAMUSCULAR | Status: AC
  Filled 2019-10-16: qty 2

## 2019-10-16 MED ORDER — PROPOFOL 10 MG/ML IV BOLUS
INTRAVENOUS | Status: DC | PRN
  Administered 2019-10-16: 20 mg via INTRAVENOUS

## 2019-10-16 MED ORDER — VANCOMYCIN HCL 1000 MG IV SOLR
INTRAVENOUS | Status: AC
  Filled 2019-10-16: qty 1000

## 2019-10-16 MED ORDER — BUPIVACAINE HCL 0.5 % IJ SOLN
INTRAMUSCULAR | Status: AC
  Filled 2019-10-16: qty 1

## 2019-10-16 MED ORDER — LIDOCAINE 2% (20 MG/ML) 5 ML SYRINGE
INTRAMUSCULAR | Status: AC
  Filled 2019-10-16: qty 5

## 2019-10-16 MED ORDER — ONDANSETRON HCL 4 MG/2ML IJ SOLN: 4.0000 mg | Freq: Once | INTRAMUSCULAR | Status: DC | PRN

## 2019-10-16 MED ORDER — PHENYLEPHRINE 40 MCG/ML (10ML) SYRINGE FOR IV PUSH (FOR BLOOD PRESSURE SUPPORT)
PREFILLED_SYRINGE | INTRAVENOUS | Status: AC
  Filled 2019-10-16: qty 10

## 2019-10-16 MED ORDER — DEXAMETHASONE SODIUM PHOSPHATE 10 MG/ML IJ SOLN
INTRAMUSCULAR | Status: AC
  Filled 2019-10-16: qty 2

## 2019-10-16 MED ORDER — DEXAMETHASONE SODIUM PHOSPHATE 10 MG/ML IJ SOLN
INTRAMUSCULAR | Status: AC
  Filled 2019-10-16: qty 1

## 2019-10-16 MED ORDER — BUPIVACAINE HCL (PF) 0.75 % IJ SOLN
INTRAMUSCULAR | Status: DC | PRN
  Administered 2019-10-16: 1.4 mg

## 2019-10-16 MED ORDER — ONDANSETRON HCL 4 MG/2ML IJ SOLN
INTRAMUSCULAR | Status: AC
  Filled 2019-10-16: qty 4

## 2019-10-16 MED ORDER — ACETAMINOPHEN 325 MG PO TABS
325.0000 mg | ORAL_TABLET | Freq: Four times a day (QID) | ORAL | Status: DC | PRN
  Administered 2019-10-16: 11:00:00 650 mg via ORAL

## 2019-10-16 MED ORDER — ACETAMINOPHEN 325 MG PO TABS
ORAL_TABLET | ORAL | Status: AC
  Filled 2019-10-16: qty 2

## 2019-10-16 MED ORDER — EPINEPHRINE PF 1 MG/ML IJ SOLN
INTRAMUSCULAR | Status: AC
  Filled 2019-10-16: qty 1

## 2019-10-16 MED ORDER — PROPOFOL 500 MG/50ML IV EMUL
INTRAVENOUS | Status: DC | PRN
  Administered 2019-10-16 (×2): 100 ug/kg/min via INTRAVENOUS

## 2019-10-16 MED ORDER — DEXAMETHASONE SODIUM PHOSPHATE 4 MG/ML IJ SOLN
INTRAMUSCULAR | Status: DC | PRN
  Administered 2019-10-16: 5 mg via INTRAVENOUS

## 2019-10-16 MED ORDER — FENTANYL CITRATE (PF) 100 MCG/2ML IJ SOLN
25.0000 ug | INTRAMUSCULAR | Status: DC | PRN
  Administered 2019-10-16: 50 ug via INTRAVENOUS

## 2019-10-16 MED ORDER — PROPOFOL 1000 MG/100ML IV EMUL
INTRAVENOUS | Status: AC
  Filled 2019-10-16: qty 100

## 2019-10-16 MED ORDER — ONDANSETRON HCL 4 MG/2ML IJ SOLN
INTRAMUSCULAR | Status: DC | PRN
  Administered 2019-10-16: 4 mg via INTRAVENOUS

## 2019-10-16 MED ORDER — CEFAZOLIN SODIUM-DEXTROSE 2-4 GM/100ML-% IV SOLN
2.0000 g | INTRAVENOUS | Status: AC
  Administered 2019-10-16: 2 g via INTRAVENOUS
  Filled 2019-10-16: qty 100

## 2019-10-16 MED ORDER — VANCOMYCIN HCL 1000 MG IV SOLR
INTRAVENOUS | Status: DC | PRN
  Administered 2019-10-16: 1000 mg via TOPICAL

## 2019-10-16 MED ORDER — LIDOCAINE HCL (CARDIAC) PF 100 MG/5ML IV SOSY
PREFILLED_SYRINGE | INTRAVENOUS | Status: DC | PRN
  Administered 2019-10-16: 100 mg via INTRAVENOUS

## 2019-10-16 MED ORDER — PROPOFOL 10 MG/ML IV BOLUS
INTRAVENOUS | Status: AC
  Filled 2019-10-16: qty 20

## 2019-10-16 MED ORDER — FENTANYL CITRATE (PF) 100 MCG/2ML IJ SOLN
INTRAMUSCULAR | Status: DC | PRN
  Administered 2019-10-16: 50 ug via INTRAVENOUS
  Administered 2019-10-16: 25 ug via INTRAVENOUS

## 2019-10-16 MED ORDER — TRANEXAMIC ACID-NACL 1000-0.7 MG/100ML-% IV SOLN
1000.0000 mg | INTRAVENOUS | Status: AC
  Administered 2019-10-16: 1000 mg via INTRAVENOUS
  Filled 2019-10-16: qty 100

## 2019-10-16 MED ORDER — CHLORHEXIDINE GLUCONATE 4 % EX LIQD: 60.0000 mL | Freq: Once | CUTANEOUS | Status: DC

## 2019-10-16 MED ORDER — BUPIVACAINE HCL (PF) 0.5 % IJ SOLN
INTRAMUSCULAR | Status: DC | PRN
  Administered 2019-10-16: 20 mL

## 2019-10-16 MED ORDER — SODIUM CHLORIDE 0.9 % IR SOLN
Status: DC | PRN
  Administered 2019-10-16: 3000 mL

## 2019-10-16 MED ORDER — MIDAZOLAM HCL 2 MG/2ML IJ SOLN
INTRAMUSCULAR | Status: AC
  Filled 2019-10-16: qty 2

## 2019-10-16 MED ORDER — SODIUM CHLORIDE 0.9% FLUSH
INTRAVENOUS | Status: DC | PRN
  Administered 2019-10-16: 20 mL via INTRAVENOUS

## 2019-10-16 MED ORDER — PHENYLEPHRINE HCL (PRESSORS) 10 MG/ML IV SOLN
INTRAVENOUS | Status: DC | PRN
  Administered 2019-10-16: 80 ug via INTRAVENOUS

## 2019-10-16 MED ORDER — FENTANYL CITRATE (PF) 100 MCG/2ML IJ SOLN
INTRAMUSCULAR | Status: AC
  Filled 2019-10-16: qty 2

## 2019-10-16 MED ORDER — FENTANYL CITRATE (PF) 250 MCG/5ML IJ SOLN
INTRAMUSCULAR | Status: AC
  Filled 2019-10-16: qty 5

## 2019-10-16 MED ORDER — MIDAZOLAM HCL 2 MG/2ML IJ SOLN
INTRAMUSCULAR | Status: DC | PRN
  Administered 2019-10-16: 2 mg via INTRAVENOUS

## 2019-10-16 MED ORDER — PHENYLEPHRINE HCL-NACL 10-0.9 MG/250ML-% IV SOLN
INTRAVENOUS | Status: DC | PRN
  Administered 2019-10-16: 40 ug/min via INTRAVENOUS

## 2019-10-16 MED ORDER — EPINEPHRINE PF 1 MG/ML IJ SOLN
INTRAMUSCULAR | Status: DC | PRN
  Administered 2019-10-16: .15 mL via INTRAMUSCULAR

## 2019-10-16 MED ORDER — POVIDONE-IODINE 10 % EX SWAB
2.0000 "application " | Freq: Once | CUTANEOUS | Status: AC
  Administered 2019-10-16: 2 via TOPICAL

## 2019-10-16 MED ORDER — LACTATED RINGERS IV SOLN: INTRAVENOUS | Status: DC

## 2019-10-16 MED ORDER — ROPIVACAINE HCL 5 MG/ML IJ SOLN
INTRAMUSCULAR | Status: DC | PRN
  Administered 2019-10-16: 20 mL via PERINEURAL

## 2019-10-16 SURGICAL SUPPLY — 87 items
ADH SKN CLS APL DERMABOND .7 (GAUZE/BANDAGES/DRESSINGS) ×1
ALCOHOL 70% 16 OZ (MISCELLANEOUS) ×2 IMPLANT
BAG DECANTER FOR FLEXI CONT (MISCELLANEOUS) ×2 IMPLANT
BANDAGE ESMARK 6X9 LF (GAUZE/BANDAGES/DRESSINGS) ×1 IMPLANT
BLADE SAG 18X100X1.27 (BLADE) ×2 IMPLANT
BNDG CMPR 9X6 STRL LF SNTH (GAUZE/BANDAGES/DRESSINGS) ×1
BNDG CMPR MED 10X6 ELC LF (GAUZE/BANDAGES/DRESSINGS) ×1
BNDG ELASTIC 6X10 VLCR STRL LF (GAUZE/BANDAGES/DRESSINGS) ×2 IMPLANT
BNDG ESMARK 6X9 LF (GAUZE/BANDAGES/DRESSINGS) ×2
BOWL SMART MIX CTS (DISPOSABLE) ×2 IMPLANT
BSPLAT TIB 5D F CMNT KN LT (Knees) ×1 IMPLANT
CEMENT BONE REFOBACIN R1X40 US (Cement) ×2 IMPLANT
CLSR STERI-STRIP ANTIMIC 1/2X4 (GAUZE/BANDAGES/DRESSINGS) ×3 IMPLANT
COMPONENT FEM CEMT SZ 10 LT (Joint) ×1 IMPLANT
COVER SURGICAL LIGHT HANDLE (MISCELLANEOUS) ×2 IMPLANT
COVER WAND RF STERILE (DRAPES) ×2 IMPLANT
CUFF TOURN SGL QUICK 34 (TOURNIQUET CUFF) ×2
CUFF TOURN SGL QUICK 42 (TOURNIQUET CUFF) IMPLANT
CUFF TRNQT CYL 34X4.125X (TOURNIQUET CUFF) ×1 IMPLANT
DERMABOND ADVANCED (GAUZE/BANDAGES/DRESSINGS) ×1
DERMABOND ADVANCED .7 DNX12 (GAUZE/BANDAGES/DRESSINGS) ×1 IMPLANT
DRAPE EXTREMITY T 121X128X90 (DISPOSABLE) ×2 IMPLANT
DRAPE HALF SHEET 40X57 (DRAPES) ×2 IMPLANT
DRAPE INCISE IOBAN 66X45 STRL (DRAPES) IMPLANT
DRAPE ORTHO SPLIT 77X108 STRL (DRAPES) ×4
DRAPE POUCH INSTRU U-SHP 10X18 (DRAPES) ×2 IMPLANT
DRAPE SURG ORHT 6 SPLT 77X108 (DRAPES) ×2 IMPLANT
DRAPE U-SHAPE 47X51 STRL (DRAPES) ×4 IMPLANT
DRESSING AQUACEL AG SP 3.5X10 (GAUZE/BANDAGES/DRESSINGS) IMPLANT
DRSG AQUACEL AG SP 3.5X10 (GAUZE/BANDAGES/DRESSINGS) ×2
DRSG PAD ABDOMINAL 8X10 ST (GAUZE/BANDAGES/DRESSINGS) ×4 IMPLANT
DURAPREP 26ML APPLICATOR (WOUND CARE) ×6 IMPLANT
ELECT CAUTERY BLADE 6.4 (BLADE) ×2 IMPLANT
ELECT REM PT RETURN 9FT ADLT (ELECTROSURGICAL) ×2
ELECTRODE REM PT RTRN 9FT ADLT (ELECTROSURGICAL) ×1 IMPLANT
GAUZE SPONGE 4X4 12PLY STRL (GAUZE/BANDAGES/DRESSINGS) ×2 IMPLANT
GLOVE BIOGEL PI IND STRL 7.0 (GLOVE) ×1 IMPLANT
GLOVE BIOGEL PI INDICATOR 7.0 (GLOVE) ×1
GLOVE ECLIPSE 7.0 STRL STRAW (GLOVE) ×6 IMPLANT
GLOVE SKINSENSE NS SZ7.5 (GLOVE) ×3
GLOVE SKINSENSE STRL SZ7.5 (GLOVE) ×3 IMPLANT
GLOVE SURG SYN 7.5  E (GLOVE) ×4
GLOVE SURG SYN 7.5 E (GLOVE) ×4 IMPLANT
GLOVE SURG SYN 7.5 PF PI (GLOVE) ×4 IMPLANT
GOWN STRL REIN XL XLG (GOWN DISPOSABLE) ×2 IMPLANT
GOWN STRL REUS W/ TWL LRG LVL3 (GOWN DISPOSABLE) ×1 IMPLANT
GOWN STRL REUS W/TWL LRG LVL3 (GOWN DISPOSABLE) ×2
HANDPIECE INTERPULSE COAX TIP (DISPOSABLE) ×2
HDLS TROCR DRIL PIN KNEE 75 (PIN) ×1
HOOD PEEL AWAY FLYTE STAYCOOL (MISCELLANEOUS) ×4 IMPLANT
KIT BASIN OR (CUSTOM PROCEDURE TRAY) ×2 IMPLANT
KIT TURNOVER KIT B (KITS) ×2 IMPLANT
MANIFOLD NEPTUNE II (INSTRUMENTS) ×2 IMPLANT
MARKER SKIN DUAL TIP RULER LAB (MISCELLANEOUS) ×2 IMPLANT
NDL SPNL 18GX3.5 QUINCKE PK (NEEDLE) ×2 IMPLANT
NEEDLE SPNL 18GX3.5 QUINCKE PK (NEEDLE) ×4 IMPLANT
NS IRRIG 1000ML POUR BTL (IV SOLUTION) ×2 IMPLANT
PACK TOTAL JOINT (CUSTOM PROCEDURE TRAY) ×2 IMPLANT
PAD ABD 8X10 STRL (GAUZE/BANDAGES/DRESSINGS) ×2 IMPLANT
PAD ARMBOARD 7.5X6 YLW CONV (MISCELLANEOUS) ×4 IMPLANT
PAD CAST 4YDX4 CTTN HI CHSV (CAST SUPPLIES) ×1 IMPLANT
PADDING CAST COTTON 4X4 STRL (CAST SUPPLIES) ×2
PADDING CAST COTTON 6X4 STRL (CAST SUPPLIES) ×1 IMPLANT
PIN DRILL HDLS TROCAR 75 4PK (PIN) IMPLANT
SAW OSC TIP CART 19.5X105X1.3 (SAW) ×2 IMPLANT
SCREW FEMALE HEX FIX 25X2.5 (ORTHOPEDIC DISPOSABLE SUPPLIES) ×2 IMPLANT
SET HNDPC FAN SPRY TIP SCT (DISPOSABLE) ×1 IMPLANT
STAPLER VISISTAT 35W (STAPLE) IMPLANT
STEM ARTISURF EF 12 SZ8-11 (Stem) ×2 IMPLANT
STEM POLY PAT PLY 32M KNEE (Knees) ×1 IMPLANT
STEM TIBIA 5 DEG SZ F L KNEE (Knees) IMPLANT
SUCTION FRAZIER HANDLE 10FR (MISCELLANEOUS) ×1
SUCTION TUBE FRAZIER 10FR DISP (MISCELLANEOUS) ×1 IMPLANT
SUT ETHILON 2 0 FS 18 (SUTURE) IMPLANT
SUT MNCRL AB 4-0 PS2 18 (SUTURE) IMPLANT
SUT VIC AB 0 CT1 27 (SUTURE) ×4
SUT VIC AB 0 CT1 27XBRD ANBCTR (SUTURE) ×2 IMPLANT
SUT VIC AB 1 CTX 27 (SUTURE) ×6 IMPLANT
SUT VIC AB 2-0 CT1 27 (SUTURE) ×12
SUT VIC AB 2-0 CT1 TAPERPNT 27 (SUTURE) ×4 IMPLANT
SYRINGE REG LEUR 60CC (SYRINGE) ×4 IMPLANT
TIBIA STEM 5 DEG SZ F L KNEE (Knees) ×2 IMPLANT
TOWEL GREEN STERILE (TOWEL DISPOSABLE) ×2 IMPLANT
TOWEL GREEN STERILE FF (TOWEL DISPOSABLE) ×2 IMPLANT
TRAY CATH 16FR W/PLASTIC CATH (SET/KITS/TRAYS/PACK) IMPLANT
UNDERPAD 30X30 (UNDERPADS AND DIAPERS) ×2 IMPLANT
WRAP KNEE MAXI GEL POST OP (GAUZE/BANDAGES/DRESSINGS) ×2 IMPLANT

## 2019-10-16 NOTE — Anesthesia Procedure Notes (Signed)
Spinal  Patient location during procedure: OR Start time: 10/16/2019 7:20 AM End time: 10/16/2019 7:26 AM Staffing Performed: anesthesiologist  Anesthesiologist: Marcene Duos, MD Preanesthetic Checklist Completed: patient identified, IV checked, site marked, risks and benefits discussed, surgical consent, monitors and equipment checked, pre-op evaluation and timeout performed Spinal Block Patient position: sitting Prep: DuraPrep Patient monitoring: heart rate, cardiac monitor, continuous pulse ox and blood pressure Approach: midline Location: L4-5 Injection technique: single-shot Needle Needle type: Pencan  Needle gauge: 24 G Needle length: 9 cm Assessment Sensory level: T4

## 2019-10-16 NOTE — Care Plan (Signed)
RNCM call to patient prior to surgery to discuss her upcoming Left TKA with Dr. Roda Shutters on 10/16/19. Discussed that CM has been requested to assist with any needs related to her surgery and the possibility of same day discharge. Explained Dr. Roda Shutters would like for her to have a CPM for home use after surgery as well as anticipated HHPT. Patient will need a FWW and 3in1 as well. She has good support at home. Referral made to [employer] for all DME, which was delivered on Friday, 10/13/19 to her home with CPM set up. Choice provided for HHPT. Referral made to [facility]. OPPT referral made and scheduled for 11/03/19 at 10:15 am with [facility] location. F/U already scheduled. Will continue to follow for any further CM needs.

## 2019-10-16 NOTE — Op Note (Signed)
Total Knee Arthroplasty Procedure Note  Preoperative diagnosis: Left knee osteoarthritis  Postoperative diagnosis:same  Operative procedure: Left total knee arthroplasty. CPT [id number]  Surgeon: N. Glee Arvin, MD  Assist: Oneal Grout, PA-C; necessary for the timely completion of procedure and due to complexity of procedure.  Anesthesia: Spinal, regional  Tourniquet time: see anesthesia record  Implants used: Zimmer persona Femur: CR 10 narrow Tibia: F Patella: 32 mm Polyethylene: 12 mm, MC  Indication: Hailey [name] is a 59 y.o. year old female with a history of knee pain. Having failed conservative management, the patient elected to proceed with a total knee arthroplasty.  We have reviewed the risk and benefits of the surgery and they elected to proceed after voicing understanding.  Procedure:  After informed consent was obtained and understanding of the risk were voiced including but not limited to bleeding, infection, damage to surrounding structures including nerves and vessels, blood clots, leg length inequality and the failure to achieve desired results, the operative extremity was marked with verbal confirmation of the patient in the holding area.   The patient was then brought to the operating room and transported to the operating room table in the supine position.  A tourniquet was applied to the operative extremity around the upper thigh. The operative limb was then prepped and draped in the usual sterile fashion and preoperative antibiotics were administered.  A time out was performed prior to the start of surgery confirming the correct extremity, preoperative antibiotic administration, as well as team members, implants and instruments available for the case. Correct surgical site was also confirmed with preoperative radiographs. The limb was then elevated for exsanguination and the tourniquet was inflated. A midline incision was made and a standard medial  parapatellar approach was performed.  The patella was prepared and sized to a 32 mm.  A cover was placed on the patella for protection from retractors.  We then turned our attention to the femur. Posterior cruciate ligament was sacrificed. Start site was drilled in the femur and the intramedullary distal femoral cutting guide was placed, set at 5 degrees valgus, taking 9 mm of distal resection. The distal cut was made. Osteophytes were then removed.  Extension gap was then checked. Next, the proximal tibial cutting guide was placed with appropriate slope, varus/valgus alignment and depth of resection. The proximal tibial cut was made. Gap blocks were then used to assess the extension gap and alignment, and appropriate soft tissue releases were performed. Attention was turned back to the femur, which was sized using the sizing guide to a size 10. Appropriate rotation of the femoral component was determined using epicondylar axis, Whiteside's line, and assessing the flexion gap under ligament tension. The appropriate size 4-in-1 cutting block was placed and cuts were made. Posterior femoral osteophytes and uncapped bone were then removed with the curved osteotome.  Trial components were placed, and stability was checked in full extension, mid-flexion, and deep flexion. Proper tibial rotation was determined and marked.  The patella tracked well without a lateral release. Trial components were then removed and tibial preparation performed.  The tibia was sized for a size F component.  A posterior capsular injection comprising of 20 cc of 1.3% exparel, 20 cc of 0.25% bupivicaine with epi and 20 cc of normal saline was performed for postoperative pain control. The bony surfaces were irrigated with a pulse lavage and then dried. Bone cement was vacuum mixed on the back table, and the final components sized above were cemented  into place. After cement had finished curing, excess cement was removed. The stability of the  construct was re-evaluated throughout a range of motion and found to be acceptable. The trial liner was removed, the knee was copiously irrigated, and the knee was re-evaluated for any excess bone debris. The real polyethylene liner, 12 mm thick, was inserted and checked to ensure the locking mechanism had engaged appropriately. The tourniquet was deflated and hemostasis was achieved. The wound was irrigated with normal saline.  One gram of vancomycin powder was placed in the surgical bed.  Capsular closure was performed with a #1 vicryl, subcutaneous fat closed with a 0 vicryl suture, then subcutaneous tissue closed with interrupted 2.0 vicryl suture. The skin was then closed with a 2.0 nylon and dermabond. A sterile dressing was applied.  The patient was awakened in the operating room and taken to recovery in stable condition. All sponge, needle, and instrument counts were correct at the end of the case.  Position: supine  Complications: none.  Time Out: performed   Drains/Packing: none  Estimated blood loss: minimal  Returned to Recovery Room: in good condition.   Antibiotics: yes   Mechanical VTE (DVT) Prophylaxis: sequential compression devices, TED thigh-high  Chemical VTE (DVT) Prophylaxis: aspirin  Fluid Replacement  Crystalloid: see anesthesia record Blood: none  FFP: none   Specimens Removed: 1 to pathology   Sponge and Instrument Count Correct? yes   PACU: portable radiograph - knee AP and Lateral   Plan/RTC: Return in 2 weeks for wound check.   Weight Bearing/Load Lower Extremity: full   N. Eduard Roux, MD [facility] 9:19 AM

## 2019-10-16 NOTE — Discharge Summary (Signed)
Patient ID: Hailey [name] MRN: [id number] DOB/AGE: [birth year] 59 y.o.  Admit date: 10/16/2019 Discharge date: 10/16/2019  Admission Diagnoses:  Primary osteoarthritis of left knee  Discharge Diagnoses:  Principal Problem:   Primary osteoarthritis of left knee Active Problems:   Status post total left knee replacement   Past Medical History:  Diagnosis Date  . Arthritis   . CARPAL TUNNEL SYNDROME, BILATERAL 08/04/2010  . GERD 03/06/2008  . HYPERTENSION 02/10/2007  . Obesity   . OBESITY NOS 02/10/2007    Surgeries: Procedure(s): LEFT TOTAL KNEE ARTHROPLASTY on 10/16/2019   Consultants (if any):   Discharged Condition: Improved  Hospital Course: Hailey [name] is an 59 y.o. female who was admitted 10/16/2019 with a diagnosis of Primary osteoarthritis of left knee and went to the operating room on 10/16/2019 and underwent the above named procedures.    She was given perioperative antibiotics:  Anti-infectives (From admission, onward)   Start     Dose/Rate Route Frequency Ordered Stop   10/16/19 0844  vancomycin (VANCOCIN) powder  Status:  Discontinued       As needed 10/16/19 0844 10/16/19 1003   10/16/19 0630  ceFAZolin (ANCEF) IVPB 2g/100 mL premix     2 g 200 mL/hr over 30 Minutes Intravenous On call to O.R. 10/16/19 4270 10/16/19 0735    .  She was given sequential compression devices, early ambulation, and appropriate chemoprophylaxis for DVT prophylaxis.  She benefited maximally from the hospital stay and there were no complications.    Recent vital signs:  Vitals:   10/16/19 1145 10/16/19 1200  BP: (!) 114/50 116/72  Pulse: 71 78  Resp: (!) 22 (!) 23  Temp:    SpO2: 99% 99%    Recent laboratory studies:  Lab Results  Component Value Date   HGB 13.2 10/12/2019   HGB 13.2 02/24/2019   HGB 12.4 12/29/2016   Lab Results  Component Value Date   WBC 5.0 10/12/2019   PLT 343 10/12/2019   Lab Results  Component Value Date   INR 1.1 10/12/2019    Lab Results  Component Value Date   NA 139 10/12/2019   K 4.8 10/12/2019   CL 104 10/12/2019   CO2 25 10/12/2019   BUN 12 10/12/2019   CREATININE 0.66 10/12/2019   GLUCOSE 88 10/12/2019    Discharge Medications:   Allergies as of 10/16/2019      Reactions   Sulfamethoxazole Hives      Medication List    TAKE these medications   aspirin EC 81 MG tablet Take 1 tablet (81 mg total) by mouth 2 (two) times daily.   benazepril 20 MG tablet Commonly known as: LOTENSIN TAKE 1 TABLET BY MOUTH EVERY DAY   cetirizine-pseudoephedrine 5-120 MG tablet Commonly known as: ZYRTEC-D Take 1 tablet by mouth daily as needed for allergies.   cholecalciferol 25 MCG (1000 UNIT) tablet Commonly known as: VITAMIN D3 Take 1,000 Units by mouth daily.   FERROUS SULFATE PO Take 1 tablet by mouth daily.   ketorolac 10 MG tablet Commonly known as: TORADOL Take 1 tablet (10 mg total) by mouth 2 (two) times daily as needed.   methocarbamol 750 MG tablet Commonly known as: ROBAXIN Take 1 tablet (750 mg total) by mouth 2 (two) times daily as needed for muscle spasms.   multivitamin with minerals Tabs tablet Take 1 tablet by mouth daily.   ondansetron 4 MG tablet Commonly known as: ZOFRAN Take 1-2 tablets (4-8 mg total) by  mouth every 8 (eight) hours as needed for nausea or vomiting.   oxyCODONE 10 mg 12 hr tablet Commonly known as: OXYCONTIN Take 1 tablet (10 mg total) by mouth every 12 (twelve) hours for 3 days.   oxyCODONE-acetaminophen 5-325 MG tablet Commonly known as: Percocet Take 1-2 tablets by mouth every 8 (eight) hours as needed for severe pain.   traMADol 50 MG tablet Commonly known as: ULTRAM Take 1 tablet (50 mg total) by mouth daily as needed. What changed:   when to take this  reasons to take this   Voltaren 1 % Gel Generic drug: diclofenac Sodium APPLY 2 GRAMS TO AFFECTED AREA 4 TIMES A DAY What changed: See the new instructions.       Diagnostic Studies:  DG Knee Left Port  Result Date: 10/16/2019 CLINICAL DATA:  Status post left knee replacement EXAM: PORTABLE LEFT KNEE - 1-2 VIEW COMPARISON:  09/06/2019 FINDINGS: Knee prosthesis is now seen in satisfactory position. No acute bony or soft tissue abnormality is noted. IMPRESSION: Status post left knee replacement Electronically Signed   By: Inez Catalina M.D.   On: 10/16/2019 10:53    Disposition: Discharge disposition: 01-Home or Self Care         Follow-up Information    Leandrew Koyanagi, MD. Go on 10/31/2019.   Specialty: Orthopedic Surgery Why: At 2:30 pm For suture removal, For wound re-check Contact information: [place] [address] [phone]        Home, [facility] Follow up.   Specialty: Home Health Services Why: Someone from the home health agency will be in contact with you after discharge to arrange a time for your first home health therapy appointment. Contact information: [address] [place] [address] [place]. Go on 11/03/2019.   Why: at 10:15 am for your first Outpatient Physical Therapy appointment: [facility] [address] [place], [place] [address] [phone]           Signed: Eduard Roux 10/16/2019, 3:59 PM

## 2019-10-16 NOTE — Anesthesia Postprocedure Evaluation (Signed)
Anesthesia Post Note  Patient: Hailey [name]  Procedure(s) Performed: LEFT TOTAL KNEE ARTHROPLASTY (Left Knee)     Patient location during evaluation: PACU Anesthesia Type: Spinal Level of consciousness: awake and alert Pain management: pain level controlled Vital Signs Assessment: post-procedure vital signs reviewed and stable Respiratory status: spontaneous breathing and respiratory function stable Cardiovascular status: blood pressure returned to baseline and stable Postop Assessment: spinal receding Anesthetic complications: no    Last Vitals:  Vitals:   10/16/19 1145 10/16/19 1200  BP: (!) 114/50 116/72  Pulse: 71 78  Resp: (!) 22 (!) 23  Temp:    SpO2: 99% 99%    Last Pain:  Vitals:   10/16/19 1130  PainSc: 3                  Kennieth Rad

## 2019-10-16 NOTE — Transfer of Care (Signed)
Immediate Anesthesia Transfer of Care Note  Patient: Hailey [name]  Procedure(s) Performed: LEFT TOTAL KNEE ARTHROPLASTY (Left Knee)  Patient Location: PACU  Anesthesia Type:MAC combined with regional for post-op pain  Level of Consciousness: awake, alert  and oriented  Airway & Oxygen Therapy: Patient Spontanous Breathing and Patient connected to nasal cannula oxygen  Post-op Assessment: Report given to RN and Post -op Vital signs reviewed and stable  Post vital signs: Reviewed and stable  Last Vitals:  Vitals Value Taken Time  BP 74/58 10/16/19 1009  Temp    Pulse 69 10/16/19 1010  Resp 20 10/16/19 1010  SpO2 91 % 10/16/19 1010  Vitals shown include unvalidated device data.  Last Pain:  Vitals:   10/16/19 0642  PainSc: 0-No pain         Complications: No apparent anesthesia complications

## 2019-10-16 NOTE — Anesthesia Procedure Notes (Signed)
Anesthesia Regional Block: Adductor canal block   Pre-Anesthetic Checklist: ,, timeout performed, Correct Patient, Correct Site, Correct Laterality, Correct Procedure, Correct Position, site marked, Risks and benefits discussed,  Surgical consent,  Pre-op evaluation,  At surgeon's request and post-op pain management  Laterality: Left  Prep: chloraprep       Needles:  Injection technique: Single-shot  Needle Type: Echogenic Needle     Needle Length: 9cm  Needle Gauge: 21     Additional Needles:   Procedures:,,,, ultrasound used (permanent image in chart),,,,  Narrative:  Start time: 10/16/2019 7:03 AM End time: 10/16/2019 7:08 AM Injection made incrementally with aspirations every 5 mL.  Performed by: Personally  Anesthesiologist: Marcene Duos, MD

## 2019-10-16 NOTE — Progress Notes (Signed)
Orthopedic Tech Progress Note Patient Details:  Hailey [name] 1960/10/18 [id number]      Post Interventions Patient Tolerated: Well Instructions Provided: Care of device   Saul Fordyce 10/16/2019, 10:27 AM

## 2019-10-16 NOTE — Progress Notes (Signed)
10/16/19 1418  PT Visit Information  Last PT Received On 10/16/19  Assistance Needed +1  History of Present Illness Pt is a 59 y/o female s/p L TKA. PMH includes HTN and obesity.   Subjective Data  Patient Stated Goal to get back to gardening  Precautions  Precautions Knee  Precaution Booklet Issued Yes (comment)  Precaution Comments Gave HEP and reviewed verbally   Restrictions  Weight Bearing Restrictions Yes  LLE Weight Bearing WBAT  Pain Assessment  Pain Assessment Faces  Faces Pain Scale 6  Pain Location L knee   Pain Descriptors / Indicators Aching;Operative site guarding  Pain Intervention(s) Limited activity within patient's tolerance;Monitored during session;Repositioned  Cognition  Arousal/Alertness Awake/alert  Behavior During Therapy WFL for tasks assessed/performed  Overall Cognitive Status Within Functional Limits for tasks assessed  Bed Mobility  Overal bed mobility Needs Assistance  Bed Mobility Supine to Sit  Supine to sit Min assist  General bed mobility comments Min A for LLE assist.   Transfers  Overall transfer level Needs assistance  Equipment used Rolling walker (2 wheeled)  Transfers Sit to/from Stand  Sit to Stand Min guard  General transfer comment Min guard for safety. Cues for safe hand placement.   Ambulation/Gait  Ambulation/Gait assistance Min guard  Gait Distance (Feet) 120 Feet  Assistive device Rolling walker (2 wheeled)  Gait Pattern/deviations Step-to pattern;Decreased step length - right;Decreased step length - left;Decreased weight shift to left;Antalgic  General Gait Details Improved control of L knee noted in afternoon session and pt able to tolerate increased gait distance. Mild L knee buckling noted with fatigue.   Gait velocity Decreased  Balance  Overall balance assessment Needs assistance  Sitting-balance support No upper extremity supported;Feet supported  Sitting balance-Leahy Scale Good  Standing balance support  Bilateral upper extremity supported;During functional activity  Standing balance-Leahy Scale Poor  Standing balance comment Reliant on UE support   General Comments  General comments (skin integrity, edema, etc.) Practiced toileting task during session.   PT - End of Session  Equipment Utilized During Treatment Gait belt  Activity Tolerance Patient tolerated treatment well  Patient left in chair;with call bell/phone within reach;with nursing/sitter in room  Nurse Communication Mobility status  CPM Left Knee  CPM Left Knee Off   PT - Assessment/Plan  PT Plan Current plan remains appropriate  PT Visit Diagnosis Unsteadiness on feet (R26.81);Muscle weakness (generalized) (M62.81)  PT Frequency (ACUTE ONLY) 7X/week  Follow Up Recommendations Follow surgeon's recommendation for DC plan and follow-up therapies;Supervision for mobility/OOB  PT equipment None recommended by PT  AM-PAC PT "6 Clicks" Mobility Outcome Measure (Version 2)  Help needed turning from your back to your side while in a flat bed without using bedrails? 3  Help needed moving from lying on your back to sitting on the side of a flat bed without using bedrails? 3  Help needed moving to and from a bed to a chair (including a wheelchair)? 3  Help needed standing up from a chair using your arms (e.g., wheelchair or bedside chair)? 3  Help needed to walk in hospital room? 3  Help needed climbing 3-5 steps with a railing?  2  6 Click Score 17  Consider Recommendation of Discharge To: Home with [facility]  PT Goal Progression  Progress towards PT goals Progressing toward goals  Acute Rehab PT Goals  PT Goal Formulation With patient  Time For Goal Achievement 10/30/19  Potential to Achieve Goals Good  PT Time Calculation  PT  Start Time (ACUTE ONLY) 1329  PT Stop Time (ACUTE ONLY) 1401  PT Time Calculation (min) (ACUTE ONLY) 32 min  PT General Charges  $$ ACUTE PT VISIT 1 Visit  PT Treatments  $Gait Training 8-22 mins   $Therapeutic Activity 8-22 mins     Pt progressing towards goals and noted improved tolerance for gait. Demonstrated increased control of L knee during afternoon session. Reviewed remainder of LLE HEP. Will continue to follow acutely.   Leighton Ruff, PT, DPT  Acute Rehabilitation Services  Pager: [phone] Office: [phone]

## 2019-10-16 NOTE — H&P (Signed)
PREOPERATIVE H&P  Chief Complaint: left knee degenerative joint disease  HPI: Hailey [name] is a 58 y.o. female who presents for surgical treatment of left knee degenerative joint disease.  She denies any changes in medical history.  Past Medical History:  Diagnosis Date  . Arthritis   . CARPAL TUNNEL SYNDROME, BILATERAL 08/04/2010  . GERD 03/06/2008  . HYPERTENSION 02/10/2007  . Obesity   . OBESITY NOS 02/10/2007   Past Surgical History:  Procedure Laterality Date  . HEMORRHOID SURGERY    . NASAL SINUS SURGERY     Social History   Socioeconomic History  . Marital status: Married    Spouse name: Not on file  . Number of children: Not on file  . Years of education: Not on file  . Highest education level: Not on file  Occupational History  . Not on file  Tobacco Use  . Smoking status: Never Smoker  . Smokeless tobacco: Never Used  Substance and Sexual Activity  . Alcohol use: Yes    Comment: rarley  . Drug use: No  . Sexual activity: Not on file  Other Topics Concern  . Not on file  Social History Narrative  . Not on file   Social Determinants of Health   Financial Resource Strain:   . Difficulty of Paying Living Expenses: Not on file  Food Insecurity:   . Worried About Programme researcher, broadcasting/film/video in the Last Year: Not on file  . Ran Out of Food in the Last Year: Not on file  Transportation Needs:   . Lack of Transportation (Medical): Not on file  . Lack of Transportation (Non-Medical): Not on file  Physical Activity:   . Days of Exercise per Week: Not on file  . Minutes of Exercise per Session: Not on file  Stress:   . Feeling of Stress : Not on file  Social Connections:   . Frequency of Communication with Friends and Family: Not on file  . Frequency of Social Gatherings with Friends and Family: Not on file  . Attends Religious Services: Not on file  . Active Member of Clubs or Organizations: Not on file  . Attends Banker Meetings: Not on file    . Marital Status: Not on file   History reviewed. No pertinent family history. Allergies  Allergen Reactions  . Sulfamethoxazole Hives   Prior to Admission medications   Medication Sig Start Date End Date Taking? Authorizing Provider  benazepril (LOTENSIN) 20 MG tablet TAKE 1 TABLET BY MOUTH EVERY DAY 02/24/19  Yes Mliss Sax, MD  cetirizine-pseudoephedrine (ZYRTEC-D) 5-120 MG tablet Take 1 tablet by mouth daily as needed for allergies.   Yes [provider]  cholecalciferol (VITAMIN D3) 25 MCG (1000 UNIT) tablet Take 1,000 Units by mouth daily.   Yes [provider]  FERROUS SULFATE PO Take 1 tablet by mouth daily.   Yes [provider]  Multiple Vitamin (MULTIVITAMIN WITH MINERALS) TABS tablet Take 1 tablet by mouth daily.   Yes [provider]  traMADol (ULTRAM) 50 MG tablet Take 1 tablet (50 mg total) by mouth daily as needed. Patient taking differently: Take 50 mg by mouth 2 (two) times daily as needed for moderate pain or severe pain.  08/09/19  Yes Afiya Ferrebee, Edwin Cap, MD  VOLTAREN 1 % GEL APPLY 2 GRAMS TO AFFECTED AREA 4 TIMES A DAY Patient taking differently: Apply 2 g topically 2 (two) times daily. To left knee 06/06/18  Yes Roda Shutters,  Marylynn Pearson, MD  aspirin EC 81 MG tablet Take 1 tablet (81 mg total) by mouth 2 (two) times daily. 10/14/19   Leandrew Koyanagi, MD  ketorolac (TORADOL) 10 MG tablet Take 1 tablet (10 mg total) by mouth 2 (two) times daily as needed. 10/14/19   Leandrew Koyanagi, MD  methocarbamol (ROBAXIN) 750 MG tablet Take 1 tablet (750 mg total) by mouth 2 (two) times daily as needed for muscle spasms. 10/14/19   Leandrew Koyanagi, MD  ondansetron (ZOFRAN) 4 MG tablet Take 1-2 tablets (4-8 mg total) by mouth every 8 (eight) hours as needed for nausea or vomiting. 10/14/19   Leandrew Koyanagi, MD  oxyCODONE (OXYCONTIN) 10 mg 12 hr tablet Take 1 tablet (10 mg total) by mouth every 12 (twelve) hours for 3 days. 10/14/19 10/17/19  Leandrew Koyanagi, MD   oxyCODONE-acetaminophen (PERCOCET) 5-325 MG tablet Take 1-2 tablets by mouth every 8 (eight) hours as needed for severe pain. 10/14/19   Leandrew Koyanagi, MD     Positive ROS: All other systems have been reviewed and were otherwise negative with the exception of those mentioned in the HPI and as above.  Physical Exam: General: Alert, no acute distress Cardiovascular: No pedal edema Respiratory: No cyanosis, no use of accessory musculature GI: abdomen soft Skin: No lesions in the area of chief complaint Neurologic: Sensation intact distally Psychiatric: Patient is competent for consent with normal mood and affect Lymphatic: no lymphedema  MUSCULOSKELETAL: exam stable  Assessment: left knee degenerative joint disease  Plan: Plan for Procedure(s): LEFT TOTAL KNEE ARTHROPLASTY  The risks benefits and alternatives were discussed with the patient including but not limited to the risks of nonoperative treatment, versus surgical intervention including infection, bleeding, nerve injury,  blood clots, cardiopulmonary complications, morbidity, mortality, among others, and they were willing to proceed.   Preoperative templating of the joint replacement has been completed, documented, and submitted to the Operating Room personnel in order to optimize intra-operative equipment management.  Patient's anticipated LOS is less than 2 midnights, meeting these requirements: - Younger than 71 - Lives within 1 hour of care - Has a competent adult at home to recover with post-op recover - NO history of  - Chronic pain requiring opiods  - Diabetes  - Coronary Artery Disease  - Heart failure  - Heart attack  - Stroke  - DVT/VTE  - Cardiac arrhythmia  - Respiratory Failure/COPD  - Renal failure  - Anemia  - Advanced Liver disease  Eduard Roux, MD   10/16/2019 7:07 AM

## 2019-10-16 NOTE — Evaluation (Signed)
Physical Therapy Evaluation Patient Details Name: Hailey [name] MRN: [id number] DOB: 1961/04/10 Today's Date: 10/16/2019   History of Present Illness  Pt is a 59 y/o female s/p L TKA. PMH includes HTN and obesity.   Clinical Impression  Pt s/p surgery above with deficits below. Pt presenting with L knee buckling and dizziness which limited ambulation. Required min to min guard A for mobility using RW. Reviewed knee precautions and supine HEP. Will need to see for second session prior to d/c to ensure safety with gait. Will continue to follow acutely to maximize functional mobility independence and safety.     Follow Up Recommendations Follow surgeon's recommendation for DC plan and follow-up therapies;Supervision for mobility/OOB    Equipment Recommendations  None recommended by PT    Recommendations for Other Services       Precautions / Restrictions Precautions Precautions: Knee Precaution Booklet Issued: No Precaution Comments: Reviewed knee precautions with pt.  Restrictions Weight Bearing Restrictions: Yes LLE Weight Bearing: Weight bearing as tolerated      Mobility  Bed Mobility Overal bed mobility: Needs Assistance Bed Mobility: Supine to Sit     Supine to sit: Min assist     General bed mobility comments: Min A for trunk elevation. Increased time to perform bed mobility.   Transfers Overall transfer level: Needs assistance Equipment used: Rolling walker (2 wheeled) Transfers: Sit to/from Stand Sit to Stand: Min guard         General transfer comment: Min guard for safety. Cues for safe hand placement.   Ambulation/Gait Ambulation/Gait assistance: Min guard;Min assist Gait Distance (Feet): 2 Feet Assistive device: Rolling walker (2 wheeled) Gait Pattern/deviations: Step-to pattern;Decreased step length - right;Decreased step length - left;Decreased weight shift to left;Antalgic Gait velocity: Decreased.    General Gait Details: Slow, antalgic gait.  Pt with buckling in L knee, so was not able to tolerate increased ambulation safely. Cues for sequencing using RW. Pt also with lightheadedness requiring seated rest.   Stairs            Wheelchair Mobility    Modified Rankin (Stroke Patients Only)       Balance Overall balance assessment: Needs assistance Sitting-balance support: No upper extremity supported;Feet supported Sitting balance-Leahy Scale: Good     Standing balance support: Bilateral upper extremity supported;During functional activity Standing balance-Leahy Scale: Poor Standing balance comment: Reliant on external and UE support                             Pertinent Vitals/Pain Pain Assessment: Faces Faces Pain Scale: Hurts little more Pain Location: L knee  Pain Descriptors / Indicators: Aching;Operative site guarding Pain Intervention(s): Limited activity within patient's tolerance;Patient requesting pain meds-RN notified;RN gave pain meds during session;Monitored during session;Repositioned    Home Living Family/patient expects to be discharged to:: Private residence Living Arrangements: Spouse/significant other Available Help at Discharge: Family;Available 24 hours/day Type of Home: House Home Access: Level entry     Home Layout: One level Home Equipment: Walker - 2 wheels;Bedside commode;Other (comment)(CPM)      Prior Function Level of Independence: Independent               Hand Dominance        Extremity/Trunk Assessment   Upper Extremity Assessment Upper Extremity Assessment: Overall WFL for tasks assessed    Lower Extremity Assessment Lower Extremity Assessment: LLE deficits/detail LLE Deficits / Details: Deficits consistent with post op pain and  weakness. Reports some numbness and experienced some buckling.  LLE Sensation: decreased light touch    Cervical / Trunk Assessment Cervical / Trunk Assessment: Normal  Communication   Communication: No difficulties   Cognition Arousal/Alertness: Awake/alert Behavior During Therapy: WFL for tasks assessed/performed Overall Cognitive Status: Within Functional Limits for tasks assessed                                        General Comments      Exercises Total Joint Exercises Ankle Circles/Pumps: AROM;Left;20 reps Quad Sets: AROM;Left;10 reps Heel Slides: AROM;Left;10 reps   Assessment/Plan    PT Assessment Patient needs continued PT services  PT Problem List Decreased strength;Decreased balance;Decreased range of motion;Decreased mobility;Decreased knowledge of use of DME;Decreased knowledge of precautions;Pain;Impaired sensation       PT Treatment Interventions DME instruction;Gait training;Functional mobility training;Therapeutic activities;Therapeutic exercise;Patient/family education;Balance training    PT Goals (Current goals can be found in the Care Plan section)  Acute Rehab PT Goals Patient Stated Goal: to get back to gardening PT Goal Formulation: With patient Time For Goal Achievement: 10/30/19 Potential to Achieve Goals: Good    Frequency 7X/week   Barriers to discharge        Co-evaluation               AM-PAC PT "6 Clicks" Mobility  Outcome Measure Help needed turning from your back to your side while in a flat bed without using bedrails?: A Little Help needed moving from lying on your back to sitting on the side of a flat bed without using bedrails?: A Little Help needed moving to and from a bed to a chair (including a wheelchair)?: A Little Help needed standing up from a chair using your arms (e.g., wheelchair or bedside chair)?: A Little Help needed to walk in hospital room?: A Little Help needed climbing 3-5 steps with a railing? : A Lot 6 Click Score: 17    End of Session Equipment Utilized During Treatment: Gait belt Activity Tolerance: Treatment limited secondary to medical complications (Comment)(dizziness) Patient left: in bed;with  call bell/phone within reach;with nursing/sitter in room(sitting at EOB ) Nurse Communication: Mobility status;Patient requests pain meds PT Visit Diagnosis: Unsteadiness on feet (R26.81);Muscle weakness (generalized) (M62.81)    Time: 3007-6226 PT Time Calculation (min) (ACUTE ONLY): 23 min   Charges:   PT Evaluation $PT Eval Low Complexity: 1 Low PT Treatments $Therapeutic Activity: 8-22 mins        Leighton Ruff, PT, DPT  Acute Rehabilitation Services  Pager: [phone] Office: [phone]   Rudean Hitt 10/16/2019, 12:34 PM

## 2019-10-16 NOTE — Addendum Note (Signed)
Addendum  created 10/16/19 1738 by Reine Just, CRNA   Intraprocedure Event edited

## 2019-10-23 ENCOUNTER — Encounter: Payer: Self-pay | Admitting: *Deleted

## 2019-10-24 ENCOUNTER — Other Ambulatory Visit: Payer: Self-pay | Admitting: Physician Assistant

## 2019-10-24 ENCOUNTER — Telehealth: Payer: Self-pay | Admitting: *Deleted

## 2019-10-24 ENCOUNTER — Other Ambulatory Visit: Payer: Self-pay

## 2019-10-24 ENCOUNTER — Ambulatory Visit (HOSPITAL_COMMUNITY)
Admission: RE | Admit: 2019-10-24 | Discharge: 2019-10-24 | Disposition: A | Discharge status: Home / Self Care | Payer: BC Managed Care – PPO | Source: Ambulatory Visit | Attending: Orthopaedic Surgery | Admitting: Orthopaedic Surgery

## 2019-10-24 DIAGNOSIS — M1712 Unilateral primary osteoarthritis, left knee: Secondary | ICD-10-CM | POA: Insufficient documentation

## 2019-10-24 MED ORDER — OXYCODONE-ACETAMINOPHEN 5-325 MG PO TABS: 1.0000 | ORAL_TABLET | Freq: Three times a day (TID) | ORAL | 0 refills | Status: DC | PRN

## 2019-10-24 MED ORDER — IBUPROFEN 800 MG PO TABS: 800.0000 mg | ORAL_TABLET | Freq: Three times a day (TID) | ORAL | 0 refills | Status: DC | PRN

## 2019-10-24 NOTE — Progress Notes (Signed)
negative

## 2019-10-24 NOTE — Telephone Encounter (Signed)
ORDER MADE 

## 2019-10-24 NOTE — Telephone Encounter (Signed)
Let's get a doppler to r/o DVT to make sure. Thanks.

## 2019-10-24 NOTE — Telephone Encounter (Signed)
Patient called and needs refill of Percocet and Ketorolac. She states she is having a lot of swelling and is having difficulty staying in the CPM because of this. Questioning her over the phone sounds like her calf is soft and non-tender, but she says there is a lot of pain medially at the knee going down 6 inches toward calf and 6 inches toward thigh. She is elevating and icing, but swelling is making movement and flexion difficult. Thanks.

## 2019-10-24 NOTE — Progress Notes (Signed)
Lower extremity venous has been completed.   Preliminary results in CV Proc.   Attempted to call results, no answer.  Blanch Media 10/24/2019 3:36 PM

## 2019-10-24 NOTE — Telephone Encounter (Signed)
RNCM call back to patient informing that Dr. Roda Shutters would like for her to have an ultrasound to rule out a DVT. Percocet refilled and changed Ketorolac to Ibuprofen 800 mg every 8 hours. These are called into her pharmacy. Someone should be in contact later today to schedule her U/S. Patient appreciative of call.

## 2019-10-25 ENCOUNTER — Encounter: Payer: Self-pay | Admitting: *Deleted

## 2019-10-25 NOTE — OR Nursing (Signed)
Late entry: updated implant log to reflect correct implants.

## 2019-10-31 ENCOUNTER — Encounter: Payer: Self-pay | Admitting: Orthopaedic Surgery

## 2019-10-31 ENCOUNTER — Other Ambulatory Visit: Payer: Self-pay

## 2019-10-31 ENCOUNTER — Ambulatory Visit (INDEPENDENT_AMBULATORY_CARE_PROVIDER_SITE_OTHER): Payer: BC Managed Care – PPO | Admitting: Physician Assistant

## 2019-10-31 DIAGNOSIS — Z96652 Presence of left artificial knee joint: Secondary | ICD-10-CM

## 2019-10-31 MED ORDER — OXYCODONE-ACETAMINOPHEN 5-325 MG PO TABS: 1.0000 | ORAL_TABLET | Freq: Three times a day (TID) | ORAL | 0 refills | Status: DC | PRN

## 2019-10-31 NOTE — Progress Notes (Signed)
   Post-Op Visit Note   Patient: Hailey [name]           Date of Birth: Sep 03, 1961           MRN: [id number] Visit Date: 10/31/2019 PCP: Mliss Sax, MD   Assessment & Plan:  Chief Complaint:  Chief Complaint  Patient presents with  . Left Knee - Pain   Visit Diagnoses:  1. S/P TKR (total knee replacement), left     Plan: Patient is a pleasant 59 year old female who comes in today 2 weeks out left total knee replacement on 1821.  She has been doing well.  She initially had a fair amount of swelling and was sent for ultrasound left lower extremity.  This was negative for blood clot.  No fevers or chills.  She does have a moderate amount of pain which is relieved with Percocet.  She has been working with home health physical therapy making great progress.  She is scheduled to start outpatient therapy this Friday.  Examination of her left knee reveals a well-healed surgical incision with nylon sutures in place.  No complication.  Calf is soft and nontender.  She does have moderate swelling.  At this point, nylon sutures removed and Steri-Strips applied.  A prescription for TED hose provided as she still has moderate swelling.  I have refilled her Percocet.  Dental prophylaxis reinforced.  Follow-up with [place] in 4 weeks time for repeat evaluation AP lateral x-rays of the left knee  Follow-Up Instructions: Return in about 4 weeks (around 11/28/2019).   Orders:  No orders of the defined types were placed in this encounter.  Meds ordered this encounter  Medications  . oxyCODONE-acetaminophen (PERCOCET) 5-325 MG tablet    Sig: Take 1-2 tablets by mouth 3 (three) times daily as needed for severe pain.    Dispense:  30 tablet    Refill:  0    Imaging: No new imaging  PMFS History: Patient Active Problem List   Diagnosis Date Noted  . Status post total left knee replacement 10/16/2019  . Primary osteoarthritis of left knee 09/06/2019  . Health care maintenance 02/24/2019  .  Chronic sinusitis 10/04/2018  . CARPAL TUNNEL SYNDROME, BILATERAL 08/04/2010  . GERD 03/06/2008  . Class 2 obesity due to excess calories with body mass index (BMI) of 38.0 to 38.9 in adult 02/10/2007  . Essential hypertension 02/10/2007   Past Medical History:  Diagnosis Date  . Arthritis   . CARPAL TUNNEL SYNDROME, BILATERAL 08/04/2010  . GERD 03/06/2008  . HYPERTENSION 02/10/2007  . Obesity   . OBESITY NOS 02/10/2007    History reviewed. No pertinent family history.  Past Surgical History:  Procedure Laterality Date  . HEMORRHOID SURGERY    . NASAL SINUS SURGERY    . TOTAL KNEE ARTHROPLASTY Left 10/16/2019   Procedure: LEFT TOTAL KNEE ARTHROPLASTY;  Surgeon: Tarry Kos, MD;  Location: [facility];  Service: Orthopedics;  Laterality: Left;   Social History   Occupational History  . Not on file  Tobacco Use  . Smoking status: Never Smoker  . Smokeless tobacco: Never Used  Substance and Sexual Activity  . Alcohol use: Yes    Comment: rarley  . Drug use: No  . Sexual activity: Not on file

## 2019-11-01 ENCOUNTER — Encounter: Payer: Self-pay | Admitting: *Deleted

## 2019-11-03 ENCOUNTER — Other Ambulatory Visit: Payer: Self-pay

## 2019-11-03 ENCOUNTER — Encounter: Payer: Self-pay | Admitting: Rehabilitative and Restorative Service Providers"

## 2019-11-03 ENCOUNTER — Ambulatory Visit (INDEPENDENT_AMBULATORY_CARE_PROVIDER_SITE_OTHER): Payer: BC Managed Care – PPO | Admitting: Rehabilitative and Restorative Service Providers"

## 2019-11-03 DIAGNOSIS — M25562 Pain in left knee: Secondary | ICD-10-CM

## 2019-11-03 DIAGNOSIS — R262 Difficulty in walking, not elsewhere classified: Secondary | ICD-10-CM | POA: Diagnosis not present

## 2019-11-03 DIAGNOSIS — M6281 Muscle weakness (generalized): Secondary | ICD-10-CM

## 2019-11-03 DIAGNOSIS — R600 Localized edema: Secondary | ICD-10-CM

## 2019-11-03 NOTE — Patient Instructions (Signed)
Access Code: [email or web address] URL: [email or web address] Date: 11/03/2019 Prepared by: Chyrel Masson  Exercises Supine Heel Slide with Strap - 10 reps - 1 sets - 10 hold - 2x daily - 7x weekly Supine Quadricep Sets - 10 reps - 1 sets - 5 hold - 2x daily - 7x weekly Small Range Straight Leg Raise - 10 reps - 3 sets - 2x daily - 7x weekly

## 2019-11-03 NOTE — Therapy (Addendum)
[facility] [address] [place], [place], [address] Phone: [phone]   Fax:  [phone]  Physical Therapy Evaluation  Patient Details  Name: Hailey [name] MRN: [id number] Date of Birth: Jan 15, 1961 Referring Provider (PT): Roda Shutters   Encounter Date: 11/03/2019  PT End of Session - 11/03/19 1109    Visit Number  1    Number of Visits  24    PT Start Time  1015    PT Stop Time  1105    PT Time Calculation (min)  50 min    Activity Tolerance  Patient tolerated treatment well    Behavior During Therapy  [facility] for tasks assessed/performed       Past Medical History:  Diagnosis Date  . Arthritis   . CARPAL TUNNEL SYNDROME, BILATERAL 08/04/2010  . GERD 03/06/2008  . HYPERTENSION 02/10/2007  . Obesity   . OBESITY NOS 02/10/2007    Past Surgical History:  Procedure Laterality Date  . HEMORRHOID SURGERY    . NASAL SINUS SURGERY    . TOTAL KNEE ARTHROPLASTY Left 10/16/2019   Procedure: LEFT TOTAL KNEE ARTHROPLASTY;  Surgeon: Tarry Kos, MD;  Location: [facility] OR;  Service: Orthopedics;  Laterality: Left;    There were no vitals filed for this visit.   Subjective Assessment - 11/03/19 1022    Subjective  Pt. comes to clinic s/p recent TKA on L knee.  Pt. reported surgery 10/16/2019.  Pt. reported home health for 7 visits prior to today's evaluation.  Pt. indicated concerns about swelling continued at this time, but improving.   Pt. indicated swelling limits mobility at times.  Pt. has used CPM and will until this week ends.  Pt. stated doing HEP from home health at this time including standing kicks, squats, etc.    Limitations  Standing;Walking    Patient Stated Goals  Indepent walking at PLOF s limitation.  Pt. expressed desire to jog and use elliptical at home.    Currently in Pain?  Yes    Pain Score  0-No pain   Pain at worst 10. Pain in last week at worst 6/10.   Pain Location  Knee    Pain Orientation  Left    Pain Descriptors / Indicators   Aching    Pain Type  Surgical pain;Acute pain    Pain Onset  1 to 4 weeks ago    Pain Frequency  Intermittent    Aggravating Factors   CPM machine, end range bending    Pain Relieving Factors  Movement and pain medicine, zero gravity elevation chair.    Effect of Pain on Daily Activities  Limits walking, standing, transfers.    Multiple Pain Sites  No         [facility] PT Assessment - 11/03/19 1028      Assessment   Medical Diagnosis  L TKA     Referring Provider (PT)  Xu    Onset Date/Surgical Date  10/16/19    Hand Dominance  Left    Next MD Visit  11/28/2019    Prior Therapy  Home health for TKA      Precautions   Precautions  None      Restrictions   Weight Bearing Restrictions  No      Balance Screen   Has the patient fallen in the past 6 months  No    Has the patient had a decrease in activity level because of a fear of falling?   No  Is the patient reluctant to leave their home because of a fear of falling?   No      Home Environment   Living Environment  Private residence    Living Arrangements  Spouse/significant other    Type of Home  House    Home Access  Level entry    Home Layout  One level;Other (Comment)   basement with gym   Home Equipment  Gilmer Mor - single point      Prior Function   Level of Independence  Independent    Vocation  Full time employment   Currently not working   Conservation officer, nature service at school with standing, walking, occasional lifting   lift up to 50 lbs with occasional squatting required   Leisure  Elliptical, home gym workouts, walking for exercise.  Does yardwork routinely      Cognition   Overall Cognitive Status  Within Functional Limits for tasks assessed      Observation/Other Assessments   Observations  Visible LLE edema from knee to foot/ankle compared to R      ROM / Strength   AROM / PROM / Strength  AROM;PROM;Strength      AROM   AROM Assessment Site  Knee    Right/Left Knee  Left;Right    Left Knee  Extension  -3    Left Knee Flexion  59   Measured in supine, self limited due to pain     PROM   PROM Assessment Site  Knee    Right/Left Knee  Left;Right    Left Knee Extension  0    Left Knee Flexion  69   Pain limited, guarding     Strength   Overall Strength Comments  L SLR lacking approx. 10 deg extension.  Poor to fair quad set control    Strength Assessment Site  Knee;Hip;Ankle    Right/Left Hip  Left;Right    Right Hip Flexion  5/5    Left Hip Flexion  4/5    Right/Left Knee  Left;Right      Ambulation/Gait   Assistive device  Straight cane                Objective measurements completed on examination: See above findings.      [facility] Adult PT Treatment/Exercise - 11/03/19 1028      Ambulation/Gait   Gait Comments  Mild reduction in L stance phase and step length      Exercises   Exercises  Knee/Hip      Knee/Hip Exercises: Stretches   Knee: Self-Stretch to increase Flexion  Left;10 seconds;3 reps   supine heel slide     Knee/Hip Exercises: Supine   Quad Sets  Left;1 set;5 reps   5 sec   Straight Leg Raises  Strengthening;Left;1 set;10 reps      Modalities   Modalities  Vasopneumatic      Vasopneumatic   Number Minutes Vasopneumatic   10 minutes    Vasopnuematic Location   Knee    Vasopneumatic Pressure  Medium    Vasopneumatic Temperature   34             PT Education - 11/03/19 1047    Education Details  HEP instruction    Person(s) Educated  Patient    Methods  Explanation;Tactile cues;Verbal cues;Handout    Comprehension  Verbalized understanding;Returned demonstration       PT Short Term Goals - 11/03/19 1053      PT SHORT TERM  GOAL #1   Title  Patient will demonstrate independent use of home exercise program to maintain progress from in clinic treatments.    Baseline  11/03/2019: dependent on cues/handout    Time  2    Period  Weeks    Status  New    Target Date  11/17/19        PT Long Term Goals - 11/03/19 1047       PT LONG TERM GOAL #1   Title  Patient will demonstrate/report pain at worst less than or equal to 2/10 to facilitate minimal limitation in daily activity secondary to pain symptoms.    Baseline  11/03/2019 : see chart( up to 10 at worst)    Time  8    Period  Weeks    Status  New    Target Date  12/29/19      PT LONG TERM GOAL #2   Title  Patient will demonstrate independent use of home exercise program to facilitate ability to maintain/progress functional gains from skilled physical therapy services.    Time  8    Period  Weeks    Status  New    Target Date  12/29/19      PT LONG TERM GOAL #3   Title  Patient will demonstrate return to work/recreational activity at previous level of function without limitations secondary due to condition    Time  8    Period  Weeks    Status  New    Target Date  12/29/19      PT LONG TERM GOAL #4   Title  Pt. will demonstrate L knee AROM 0-110 degrees to facilitate usual transfers, walking, stairs at PLOF s limitation.    Time  8    Period  Weeks    Status  New    Target Date  12/29/19      PT LONG TERM GOAL #5   Title  Patient will demonstrate independent ambulation community distances > 300 ft to facilitate community integration at [facility].    Baseline  11/03/2019 : SPC    Time  8    Period  Weeks    Status  New    Target Date  12/29/19      Additional Long Term Goals   Additional Long Term Goals  Yes      PT LONG TERM GOAL #6   Title  Pt. will demonstrate LLE MMT 5/5 throughout to facilitate ability to perform usual work standing, squatting, lifting at PLOF s limitation.    Time  8    Period  Weeks    Status  New    Target Date  12/29/19             Plan - 11/03/19 1057    Clinical Impression Statement  Patient is a  59 yo female who comes to clinic with complaints of L knee pain with mobility, strength and movement coordination deficits that impair their ability to perform usual daily and recreational functional activities  without increase difficulty/symptoms at this time.  Patient to benefit from skilled PT services to address impairments and limitations to improve to previous level of function without restriction secondary to condition.    Examination-Activity Limitations  Lift;Carry;Squat;Stairs;Stand;Sit;Transfers;Bed Mobility;Bend    [employer] Activity;Driving;Yard Work    Stability/Clinical Decision Making  Stable/Uncomplicated    Clinical Decision Making  Low    Rehab Potential  Good    PT Frequency  3x /  week   2-3x   PT Duration  8 weeks    PT Treatment/Interventions  Electrical Stimulation;Cryotherapy;Gait training;Stair training;Functional mobility training;Therapeutic activities;Therapeutic exercise;Balance training;Neuromuscular re-education;Manual techniques;Patient/family education;Passive range of motion;Dry needling;Joint Manipulations;Vasopneumatic Device;Taping    PT Next Visit Plan  Review HEP, progress intervention for strength, manual intervention for mobility    PT Home Exercise Plan  Access Code: [email or web address]    Consulted and Agree with Plan of Care  Patient       Patient will benefit from skilled therapeutic intervention in order to improve the following deficits and impairments:  Abnormal gait, Decreased coordination, Decreased range of motion, Difficulty walking, Decreased endurance, Decreased activity tolerance, Pain, Impaired flexibility, Hypomobility, Decreased balance, Decreased mobility, Decreased strength, Increased edema  Visit Diagnosis: Muscle weakness (generalized) - Plan: PT plan of care cert/re-cert  Acute pain of left knee - Plan: PT plan of care cert/re-cert  Difficulty in walking, not elsewhere classified - Plan: PT plan of care cert/re-cert  Added localized edema   Scot Jun, PT, DPT, OCS, ATC 11/08/19  11:58 AM       Problem List Patient Active Problem List   Diagnosis Date Noted  . Status post total left knee  replacement 10/16/2019  . Primary osteoarthritis of left knee 09/06/2019  . Health care maintenance 02/24/2019  . Chronic sinusitis 10/04/2018  . CARPAL TUNNEL SYNDROME, BILATERAL 08/04/2010  . GERD 03/06/2008  . Class 2 obesity due to excess calories with body mass index (BMI) of 38.0 to 38.9 in adult 02/10/2007  . Essential hypertension 02/10/2007    Scot Jun, PT, DPT, OCS, ATC 11/03/19  11:22 AM     [facility] Physical Therapy [address] [place], [place], [address] Phone: [phone]   Fax:  [phone]  Name: Hailey [name] MRN: [id number] Date of Birth: 10-01-1960

## 2019-11-06 ENCOUNTER — Ambulatory Visit: Payer: BC Managed Care – PPO | Admitting: Rehabilitative and Restorative Service Providers"

## 2019-11-06 ENCOUNTER — Telehealth: Payer: Self-pay

## 2019-11-06 ENCOUNTER — Other Ambulatory Visit: Payer: Self-pay

## 2019-11-06 ENCOUNTER — Encounter: Payer: Self-pay | Admitting: Rehabilitative and Restorative Service Providers"

## 2019-11-06 ENCOUNTER — Other Ambulatory Visit: Payer: Self-pay | Admitting: Physician Assistant

## 2019-11-06 DIAGNOSIS — R262 Difficulty in walking, not elsewhere classified: Secondary | ICD-10-CM

## 2019-11-06 DIAGNOSIS — R600 Localized edema: Secondary | ICD-10-CM

## 2019-11-06 DIAGNOSIS — M6281 Muscle weakness (generalized): Secondary | ICD-10-CM | POA: Diagnosis not present

## 2019-11-06 DIAGNOSIS — M25562 Pain in left knee: Secondary | ICD-10-CM

## 2019-11-06 MED ORDER — OXYCODONE-ACETAMINOPHEN 5-325 MG PO TABS: 1.0000 | ORAL_TABLET | Freq: Three times a day (TID) | ORAL | 0 refills | Status: DC | PRN

## 2019-11-06 NOTE — Telephone Encounter (Signed)
Just sent in

## 2019-11-06 NOTE — Therapy (Addendum)
[facility] Physical Therapy [address] [place], [place], [address] Phone: [phone]   Fax:  [phone]  Physical Therapy Treatment  Patient Details  Name: Hailey [name] MRN: [id number] Date of Birth: 1961/04/11 Referring Provider (PT): Erlinda Hong   Encounter Date: 11/06/2019  PT End of Session - 11/06/19 1401    Visit Number  2    Number of Visits  24    Date for PT Re-Evaluation  11/30/19    PT Start Time  1318    PT Stop Time  1410    PT Time Calculation (min)  52 min    Activity Tolerance  Patient tolerated treatment well    Behavior During Therapy  [facility] for tasks assessed/performed       Past Medical History:  Diagnosis Date  . Arthritis   . CARPAL TUNNEL SYNDROME, BILATERAL 08/04/2010  . GERD 03/06/2008  . HYPERTENSION 02/10/2007  . Obesity   . OBESITY NOS 02/10/2007    Past Surgical History:  Procedure Laterality Date  . HEMORRHOID SURGERY    . NASAL SINUS SURGERY    . TOTAL KNEE ARTHROPLASTY Left 10/16/2019   Procedure: LEFT TOTAL KNEE ARTHROPLASTY;  Surgeon: Leandrew Koyanagi, MD;  Location: [facility];  Service: Orthopedics;  Laterality: Left;    There were no vitals filed for this visit.  Subjective Assessment - 11/06/19 1325    Currently in Pain?  Yes    Pain Score  4     Pain Location  Knee    Pain Orientation  Left                       [facility] Adult PT Treatment/Exercise - 11/06/19 1333      Ambulation/Gait   Assistive device  Straight cane      Knee/Hip Exercises: Stretches   Gastroc Stretch  Both   30 sec x 5 on incline board     Knee/Hip Exercises: Standing   Gait Training  Nustep lvl 5 6 mins    Other Standing Knee Exercises  Retro step LLE posterior x 20 c occasional HHA      Knee/Hip Exercises: Seated   Long Arc Quad  Left   3x 10   Long Arc Quad Weight  3 lbs.    Hamstring Curl  Left   3 x 10 green resistance band     Knee/Hip Exercises: Supine   Heel Slides  Left   x 10 c 3 second hold    Straight Leg  Raises  Left   x10 paired c heel slides   Knee Flexion  --   x 10      Vasopneumatic   Number Minutes Vasopneumatic   10 minutes    Vasopnuematic Location   Knee    Vasopneumatic Pressure  Medium    Vasopneumatic Temperature   34      Manual Therapy   Manual Therapy  Joint mobilization    Manual therapy comments  MET for quad relaxation for Lt knee flexion.    Joint Mobilization  g2-g3 ap L knee jt mobs             PT Education - 11/06/19 1356    Education Details  Review of HEP, additional retro step activity.  Cues for intervention required throughout.    Person(s) Educated  Patient    Methods  Explanation;Demonstration    Comprehension  Returned demonstration;Verbalized understanding       PT Short Term Goals -  11/03/19 1053      PT SHORT TERM GOAL #1   Title  Patient will demonstrate independent use of home exercise program to maintain progress from in clinic treatments.    Baseline  11/03/2019: dependent on cues/handout    Time  2    Period  Weeks    Status  New    Target Date  11/17/19        PT Long Term Goals - 11/03/19 1047      PT LONG TERM GOAL #1   Title  Patient will demonstrate/report pain at worst less than or equal to 2/10 to facilitate minimal limitation in daily activity secondary to pain symptoms.    Baseline  11/03/2019 : see chart( up to 10 at worst)    Time  8    Period  Weeks    Status  New    Target Date  12/29/19      PT LONG TERM GOAL #2   Title  Patient will demonstrate independent use of home exercise program to facilitate ability to maintain/progress functional gains from skilled physical therapy services.    Time  8    Period  Weeks    Status  New    Target Date  12/29/19      PT LONG TERM GOAL #3   Title  Patient will demonstrate return to work/recreational activity at previous level of function without limitations secondary due to condition    Time  8    Period  Weeks    Status  New    Target Date  12/29/19      PT LONG  TERM GOAL #4   Title  Pt. will demonstrate L knee AROM 0-110 degrees to facilitate usual transfers, walking, stairs at PLOF s limitation.    Time  8    Period  Weeks    Status  New    Target Date  12/29/19      PT LONG TERM GOAL #5   Title  Patient will demonstrate independent ambulation community distances > 300 ft to facilitate community integration at [facility].    Baseline  11/03/2019 : SPC    Time  8    Period  Weeks    Status  New    Target Date  12/29/19      Additional Long Term Goals   Additional Long Term Goals  Yes      PT LONG TERM GOAL #6   Title  Pt. will demonstrate LLE MMT 5/5 throughout to facilitate ability to perform usual work standing, squatting, lifting at PLOF s limitation.    Time  8    Period  Weeks    Status  New    Target Date  12/29/19            Plan - 11/06/19 1356    Clinical Impression Statement  Pt. to continue to benefit from skilled PT services to address L knee pain, mobility and strength deficits which impair normal ambulation at this time and full PLOF.  Mild improvement today in quality of L knee flexion at this time.    Examination-Activity Limitations  Lift;Carry;Squat;Stairs;Stand;Sit;Transfers;Bed Mobility;Bend    [employer] Activity;Driving;Yard Work    Stability/Clinical Decision Making  Stable/Uncomplicated    Rehab Potential  Good    PT Frequency  3x / week   2-3x   PT Duration  8 weeks    PT Treatment/Interventions  Electrical Stimulation;Cryotherapy;Gait training;Stair training;Functional mobility training;Therapeutic activities;Therapeutic exercise;Balance training;Neuromuscular  re-education;Manual techniques;Patient/family education;Passive range of motion;Dry needling;Joint Manipulations;Vasopneumatic Device;Taping    PT Next Visit Plan  Continue to progress mobility, strength and movement coordination.    PT Home Exercise Plan  Access Code: [email or web address]    Consulted and Agree with Plan of Care   Patient       Patient will benefit from skilled therapeutic intervention in order to improve the following deficits and impairments:  Abnormal gait, Decreased coordination, Decreased range of motion, Difficulty walking, Decreased endurance, Decreased activity tolerance, Pain, Impaired flexibility, Hypomobility, Decreased balance, Decreased mobility, Decreased strength, Increased edema  Visit Diagnosis: Acute pain of left knee  Muscle weakness (generalized)  Difficulty in walking, not elsewhere classified  Added localized edema diagnosis to visit note  Scot Jun, PT, DPT, OCS, ATC 11/08/19  12:04 PM       Problem List Patient Active Problem List   Diagnosis Date Noted  . Status post total left knee replacement 10/16/2019  . Primary osteoarthritis of left knee 09/06/2019  . Health care maintenance 02/24/2019  . Chronic sinusitis 10/04/2018  . CARPAL TUNNEL SYNDROME, BILATERAL 08/04/2010  . GERD 03/06/2008  . Class 2 obesity due to excess calories with body mass index (BMI) of 38.0 to 38.9 in adult 02/10/2007  . Essential hypertension 02/10/2007    Scot Jun, PT, DPT, OCS, ATC 11/06/19  2:04 PM    [facility] [address] [place], [place], [address] Phone: [phone]   Fax:  [phone]  Name: ADALAYA IRION MRN: [id number] Date of Birth: Apr 08, 1961

## 2019-11-06 NOTE — Telephone Encounter (Signed)
Patient would like a RF for Oxycodone  CB [phone] Pharm [facility] [address]

## 2019-11-08 ENCOUNTER — Other Ambulatory Visit: Payer: Self-pay

## 2019-11-08 ENCOUNTER — Encounter: Payer: Self-pay | Admitting: Rehabilitative and Restorative Service Providers"

## 2019-11-08 ENCOUNTER — Ambulatory Visit (INDEPENDENT_AMBULATORY_CARE_PROVIDER_SITE_OTHER): Payer: BC Managed Care – PPO | Admitting: Rehabilitative and Restorative Service Providers"

## 2019-11-08 DIAGNOSIS — R600 Localized edema: Secondary | ICD-10-CM | POA: Diagnosis not present

## 2019-11-08 DIAGNOSIS — R262 Difficulty in walking, not elsewhere classified: Secondary | ICD-10-CM

## 2019-11-08 DIAGNOSIS — M25562 Pain in left knee: Secondary | ICD-10-CM | POA: Diagnosis not present

## 2019-11-08 DIAGNOSIS — M6281 Muscle weakness (generalized): Secondary | ICD-10-CM | POA: Diagnosis not present

## 2019-11-08 NOTE — Addendum Note (Signed)
Addended by: Chyrel Masson B on: 11/08/2019 11:59 AM   Modules accepted: Orders

## 2019-11-08 NOTE — Therapy (Signed)
[facility] Physical Therapy [address] [place], [place], [address] Phone: [phone]   Fax:  [phone]  Physical Therapy Treatment  Patient Details  Name: Hailey [name] MRN: [id number] Date of Birth: 12-12-1960 Referring Provider (PT): Purcell Mouton Date: 11/08/2019  PT End of Session - 11/08/19 0931    Visit Number  3    Number of Visits  24    Date for PT Re-Evaluation  11/30/19    PT Start Time  0846    PT Stop Time  0941    PT Time Calculation (min)  55 min    Activity Tolerance  Patient tolerated treatment well    Behavior During Therapy  [facility] for tasks assessed/performed       Past Medical History:  Diagnosis Date  . Arthritis   . CARPAL TUNNEL SYNDROME, BILATERAL 08/04/2010  . GERD 03/06/2008  . HYPERTENSION 02/10/2007  . Obesity   . OBESITY NOS 02/10/2007    Past Surgical History:  Procedure Laterality Date  . HEMORRHOID SURGERY    . NASAL SINUS SURGERY    . TOTAL KNEE ARTHROPLASTY Left 10/16/2019   Procedure: LEFT TOTAL KNEE ARTHROPLASTY;  Surgeon: Leandrew Koyanagi, MD;  Location: [facility];  Service: Orthopedics;  Laterality: Left;    There were no vitals filed for this visit.  Subjective Assessment - 11/08/19 0856    Subjective  Pt. stated feeling stiffer today, moderate level.  Pt. stated feeling like she lost some movement from last few days.    Limitations  Standing;Walking    Patient Stated Goals  Indepent walking at PLOF s limitation.  Pt. expressed desire to jog and use elliptical at home.    Currently in Pain?  Yes    Pain Score  4     Pain Location  Knee    Pain Orientation  Left    Pain Descriptors / Indicators  Tightness    Pain Type  Acute pain;Surgical pain    Pain Onset  1 to 4 weeks ago                       [facility] Adult PT Treatment/Exercise - 11/08/19 0001      Ambulation/Gait   Assistive device  Straight cane      Knee/Hip Exercises: Stretches   Press photographer  Both;30 seconds;5 reps       Knee/Hip Exercises: Aerobic   Nustep  L5 10 mins for ROM      Knee/Hip Exercises: Standing   Other Standing Knee Exercises  Retro step LLE posterior x 20 c occasional HHA      Knee/Hip Exercises: Seated   Long Arc Quad  Left;Strengthening;3 sets;10 reps    Long Arc Quad Weight  4 lbs.    Hamstring Curl  Left;Strengthening;3 sets;10 reps   blue band resistance     Knee/Hip Exercises: Supine   Heel Slides  Left;1 set;10 reps      Vasopneumatic   Number Minutes Vasopneumatic   10 minutes    Vasopnuematic Location   Knee    Vasopneumatic Pressure  Medium    Vasopneumatic Temperature   34      Manual Therapy   Manual Therapy  Joint mobilization    Manual therapy comments  MET for quad relaxation for Lt knee flexion.    Joint Mobilization  g2-g3 ap L knee jt mobs          Balance Exercises - 11/08/19 0912  Balance Exercises: Standing   Tandem Stance  Eyes open;3 reps;Other reps (comment)   30 seconds x 3 bilat   Retro Gait  Other reps (comment)   10 ft x 5 fwd/reverse in parallel bars   Other Standing Exercises  retro step LLE posterior 20 x         PT Education - 11/08/19 0857    Education Details  Various occasional cues for intervention in clinic to improve performance.    Person(s) Educated  Patient    Methods  Explanation;Verbal cues    Comprehension  Verbalized understanding;Returned demonstration       PT Short Term Goals - 11/03/19 1053      PT SHORT TERM GOAL #1   Title  Patient will demonstrate independent use of home exercise program to maintain progress from in clinic treatments.    Baseline  11/03/2019: dependent on cues/handout    Time  2    Period  Weeks    Status  New    Target Date  11/17/19        PT Long Term Goals - 11/03/19 1047      PT LONG TERM GOAL #1   Title  Patient will demonstrate/report pain at worst less than or equal to 2/10 to facilitate minimal limitation in daily activity secondary to pain symptoms.    Baseline   11/03/2019 : see chart( up to 10 at worst)    Time  8    Period  Weeks    Status  New    Target Date  12/29/19      PT LONG TERM GOAL #2   Title  Patient will demonstrate independent use of home exercise program to facilitate ability to maintain/progress functional gains from skilled physical therapy services.    Time  8    Period  Weeks    Status  New    Target Date  12/29/19      PT LONG TERM GOAL #3   Title  Patient will demonstrate return to work/recreational activity at previous level of function without limitations secondary due to condition    Time  8    Period  Weeks    Status  New    Target Date  12/29/19      PT LONG TERM GOAL #4   Title  Pt. will demonstrate L knee AROM 0-110 degrees to facilitate usual transfers, walking, stairs at PLOF s limitation.    Time  8    Period  Weeks    Status  New    Target Date  12/29/19      PT LONG TERM GOAL #5   Title  Patient will demonstrate independent ambulation community distances > 300 ft to facilitate community integration at [facility].    Baseline  11/03/2019 : SPC    Time  8    Period  Weeks    Status  New    Target Date  12/29/19      Additional Long Term Goals   Additional Long Term Goals  Yes      PT LONG TERM GOAL #6   Title  Pt. will demonstrate LLE MMT 5/5 throughout to facilitate ability to perform usual work standing, squatting, lifting at PLOF s limitation.    Time  8    Period  Weeks    Status  New    Target Date  12/29/19            Plan - 11/08/19 6333  Clinical Impression Statement  Pt. arrived today c increased stiffness and restriction in mobility but demonstrated passive knee flexion Lt around 90-95 deg today.  Presentation continues to show necessity for skilled PT services for symptom relief, improved mobility and strength and movement coordination improvements.    Examination-Activity Limitations  Lift;Carry;Squat;Stairs;Stand;Sit;Transfers;Bed Mobility;Bend    [employer] Activity;Driving;Yard Work    Stability/Clinical Decision Making  Stable/Uncomplicated    Rehab Potential  Good    PT Frequency  3x / week   2-3x   PT Duration  8 weeks    PT Treatment/Interventions  Electrical Stimulation;Cryotherapy;Gait training;Stair training;Functional mobility training;Therapeutic activities;Therapeutic exercise;Balance training;Neuromuscular re-education;Manual techniques;Patient/family education;Passive range of motion;Dry needling;Joint Manipulations;Vasopneumatic Device;Taping    PT Next Visit Plan  Manual for mobility, progress strength and movement coordination as appropriate.    PT Home Exercise Plan  Access Code: [email or web address]    Consulted and Agree with Plan of Care  Patient       Patient will benefit from skilled therapeutic intervention in order to improve the following deficits and impairments:  Abnormal gait, Decreased coordination, Decreased range of motion, Difficulty walking, Decreased endurance, Decreased activity tolerance, Pain, Impaired flexibility, Hypomobility, Decreased balance, Decreased mobility, Decreased strength, Increased edema  Visit Diagnosis: Acute pain of left knee  Muscle weakness (generalized)  Difficulty in walking, not elsewhere classified  Localized edema     Problem List Patient Active Problem List   Diagnosis Date Noted  . Status post total left knee replacement 10/16/2019  . Primary osteoarthritis of left knee 09/06/2019  . Health care maintenance 02/24/2019  . Chronic sinusitis 10/04/2018  . CARPAL TUNNEL SYNDROME, BILATERAL 08/04/2010  . GERD 03/06/2008  . Class 2 obesity due to excess calories with body mass index (BMI) of 38.0 to 38.9 in adult 02/10/2007  . Essential hypertension 02/10/2007    Scot Jun, PT, DPT, OCS, ATC 11/08/19  9:32 AM     [facility] Physical Therapy [address] [place], [place], [address] Phone: [phone]   Fax:   [phone]  Name: Hailey [name] MRN: [id number] Date of Birth: [birth year]

## 2019-11-10 ENCOUNTER — Encounter: Payer: Self-pay | Admitting: Physical Therapy

## 2019-11-10 ENCOUNTER — Ambulatory Visit (INDEPENDENT_AMBULATORY_CARE_PROVIDER_SITE_OTHER): Payer: BC Managed Care – PPO | Admitting: Physical Therapy

## 2019-11-10 ENCOUNTER — Other Ambulatory Visit: Payer: Self-pay

## 2019-11-10 DIAGNOSIS — R600 Localized edema: Secondary | ICD-10-CM | POA: Diagnosis not present

## 2019-11-10 DIAGNOSIS — M6281 Muscle weakness (generalized): Secondary | ICD-10-CM

## 2019-11-10 DIAGNOSIS — M25562 Pain in left knee: Secondary | ICD-10-CM

## 2019-11-10 DIAGNOSIS — R262 Difficulty in walking, not elsewhere classified: Secondary | ICD-10-CM

## 2019-11-10 NOTE — Therapy (Signed)
[facility] Physical Therapy [address] [place], [place], [address] Phone: [phone]   Fax:  [phone]  Physical Therapy Treatment  Patient Details  Name: Hailey [name] MRN: [id number] Date of Birth: October 10, 1960 Referring Provider (PT): Roda Shutters   Encounter Date: 11/10/2019  PT End of Session - 11/10/19 1036    Visit Number  4    Number of Visits  24    Date for PT Re-Evaluation  11/30/19    PT Start Time  1018    PT Stop Time  1108    PT Time Calculation (min)  50 min    Activity Tolerance  Patient tolerated treatment well    Behavior During Therapy  [facility] for tasks assessed/performed       Past Medical History:  Diagnosis Date  . Arthritis   . CARPAL TUNNEL SYNDROME, BILATERAL 08/04/2010  . GERD 03/06/2008  . HYPERTENSION 02/10/2007  . Obesity   . OBESITY NOS 02/10/2007    Past Surgical History:  Procedure Laterality Date  . HEMORRHOID SURGERY    . NASAL SINUS SURGERY    . TOTAL KNEE ARTHROPLASTY Left 10/16/2019   Procedure: LEFT TOTAL KNEE ARTHROPLASTY;  Surgeon: Tarry Kos, MD;  Location: [facility];  Service: Orthopedics;  Laterality: Left;    There were no vitals filed for this visit.  Subjective Assessment - 11/10/19 1023    Subjective  Pt states doing HEP, knee feels stiff today.    Limitations  Standing;Walking    Patient Stated Goals  Indepent walking at PLOF s limitation.  Pt. expressed desire to jog and use elliptical at home.    Currently in Pain?  Yes    Pain Score  5     Pain Location  Knee    Pain Orientation  Left    Pain Descriptors / Indicators  Aching;Tightness    Pain Type  Acute pain    Pain Onset  1 to 4 weeks ago    Pain Frequency  Intermittent                       [facility] Adult PT Treatment/Exercise - 11/10/19 1023      Ambulation/Gait   Assistive device  Straight cane      Knee/Hip Exercises: Stretches   Theme park manager  --      Knee/Hip Exercises: Aerobic   Nustep  L5 8 mins for ROM      Knee/Hip Exercises: Standing   Other Standing Knee Exercises  --      Knee/Hip Exercises: Seated   Long Arc Quad  Left;Strengthening;3 sets;10 reps    Long Arc Quad Weight  4 lbs.    Hamstring Curl  --   blue band resistance     Knee/Hip Exercises: Supine   Heel Slides  Left;10 reps;2 sets    Heel Slides Limitations  Strap     Straight Leg Raises  20 reps;Left      Vasopneumatic   Number Minutes Vasopneumatic   10 minutes    Vasopnuematic Location   Knee    Vasopneumatic Pressure  Medium    Vasopneumatic Temperature   34      Manual Therapy   Manual Therapy  Joint mobilization;Passive ROM    Manual therapy comments  --    Joint Mobilization  g2-g3 ap L knee jt mobs    Passive ROM  PROM for flex and ext , prone and supine  PT Short Term Goals - 11/03/19 1053      PT SHORT TERM GOAL #1   Title  Patient will demonstrate independent use of home exercise program to maintain progress from in clinic treatments.    Baseline  11/03/2019: dependent on cues/handout    Time  2    Period  Weeks    Status  New    Target Date  11/17/19        PT Long Term Goals - 11/03/19 1047      PT LONG TERM GOAL #1   Title  Patient will demonstrate/report pain at worst less than or equal to 2/10 to facilitate minimal limitation in daily activity secondary to pain symptoms.    Baseline  11/03/2019 : see chart( up to 10 at worst)    Time  8    Period  Weeks    Status  New    Target Date  12/29/19      PT LONG TERM GOAL #2   Title  Patient will demonstrate independent use of home exercise program to facilitate ability to maintain/progress functional gains from skilled physical therapy services.    Time  8    Period  Weeks    Status  New    Target Date  12/29/19      PT LONG TERM GOAL #3   Title  Patient will demonstrate return to work/recreational activity at previous level of function without limitations secondary due to condition    Time  8    Period  Weeks     Status  New    Target Date  12/29/19      PT LONG TERM GOAL #4   Title  Pt. will demonstrate L knee AROM 0-110 degrees to facilitate usual transfers, walking, stairs at PLOF s limitation.    Time  8    Period  Weeks    Status  New    Target Date  12/29/19      PT LONG TERM GOAL #5   Title  Patient will demonstrate independent ambulation community distances > 300 ft to facilitate community integration at [facility].    Baseline  11/03/2019 : SPC    Time  8    Period  Weeks    Status  New    Target Date  12/29/19      Additional Long Term Goals   Additional Long Term Goals  Yes      PT LONG TERM GOAL #6   Title  Pt. will demonstrate LLE MMT 5/5 throughout to facilitate ability to perform usual work standing, squatting, lifting at PLOF s limitation.    Time  8    Period  Weeks    Status  New    Target Date  12/29/19            Plan - 11/10/19 1211    Clinical Impression Statement  Focus on ROM for flexion with manual and ther ex today, up to 90 deg. Stiffness is limiting further flexion and funciton. Discussed focus on this for HEP. Will progress as tolerated.    Examination-Activity Limitations  Lift;Carry;Squat;Stairs;Stand;Sit;Transfers;Bed Mobility;Bend    [employer] Activity;Driving;Yard Work    Stability/Clinical Decision Making  Stable/Uncomplicated    Rehab Potential  Good    PT Frequency  3x / week   2-3x   PT Duration  8 weeks    PT Treatment/Interventions  Electrical Stimulation;Cryotherapy;Gait training;Stair training;Functional mobility training;Therapeutic activities;Therapeutic exercise;Balance training;Neuromuscular re-education;Manual techniques;Patient/family education;Passive range  of motion;Dry needling;Joint Manipulations;Vasopneumatic Device;Taping    PT Next Visit Plan  Manual for mobility, progress strength and movement coordination as appropriate.    PT Home Exercise Plan  Access Code: [email or web address]    Consulted and Agree  with Plan of Care  Patient       Patient will benefit from skilled therapeutic intervention in order to improve the following deficits and impairments:  Abnormal gait, Decreased coordination, Decreased range of motion, Difficulty walking, Decreased endurance, Decreased activity tolerance, Pain, Impaired flexibility, Hypomobility, Decreased balance, Decreased mobility, Decreased strength, Increased edema  Visit Diagnosis: Acute pain of left knee  Muscle weakness (generalized)  Difficulty in walking, not elsewhere classified  Localized edema     Problem List Patient Active Problem List   Diagnosis Date Noted  . Status post total left knee replacement 10/16/2019  . Primary osteoarthritis of left knee 09/06/2019  . Health care maintenance 02/24/2019  . Chronic sinusitis 10/04/2018  . CARPAL TUNNEL SYNDROME, BILATERAL 08/04/2010  . GERD 03/06/2008  . Class 2 obesity due to excess calories with body mass index (BMI) of 38.0 to 38.9 in adult 02/10/2007  . Essential hypertension 02/10/2007    Lyndee Hensen, PT, DPT 12:13 PM  11/10/19    [facility] Physical Therapy [address] [place], [place], [address] Phone: [phone]   Fax:  [phone]  Name: Hailey [name] MRN: [id number] Date of Birth: 1961-04-17

## 2019-11-13 ENCOUNTER — Other Ambulatory Visit: Payer: Self-pay

## 2019-11-13 ENCOUNTER — Other Ambulatory Visit: Payer: Self-pay | Admitting: Physician Assistant

## 2019-11-13 ENCOUNTER — Ambulatory Visit (INDEPENDENT_AMBULATORY_CARE_PROVIDER_SITE_OTHER): Payer: BC Managed Care – PPO | Admitting: Rehabilitative and Restorative Service Providers"

## 2019-11-13 ENCOUNTER — Telehealth: Payer: Self-pay | Admitting: Radiology

## 2019-11-13 ENCOUNTER — Encounter: Payer: Self-pay | Admitting: Rehabilitative and Restorative Service Providers"

## 2019-11-13 DIAGNOSIS — R600 Localized edema: Secondary | ICD-10-CM | POA: Diagnosis not present

## 2019-11-13 DIAGNOSIS — M6281 Muscle weakness (generalized): Secondary | ICD-10-CM | POA: Diagnosis not present

## 2019-11-13 DIAGNOSIS — M25562 Pain in left knee: Secondary | ICD-10-CM

## 2019-11-13 DIAGNOSIS — R262 Difficulty in walking, not elsewhere classified: Secondary | ICD-10-CM | POA: Diagnosis not present

## 2019-11-13 MED ORDER — HYDROCODONE-ACETAMINOPHEN 5-325 MG PO TABS: 1.0000 | ORAL_TABLET | Freq: Three times a day (TID) | ORAL | 0 refills | Status: DC | PRN

## 2019-11-13 NOTE — Telephone Encounter (Signed)
Just sent in

## 2019-11-13 NOTE — Therapy (Signed)
[facility] Physical Therapy [address] [place], [place], [address] Phone: [phone]   Fax:  [phone]  Physical Therapy Treatment  Patient Details  Name: Hailey [name] MRN: [id number] Date of Birth: [birth year] Referring Provider (PT): Roda Shutters   Encounter Date: 11/13/2019  PT End of Session - 11/13/19 1005    Visit Number  5    Number of Visits  24    Date for PT Re-Evaluation  11/30/19    PT Start Time  0932    PT Stop Time  1023    PT Time Calculation (min)  51 min    Activity Tolerance  Patient tolerated treatment well    Behavior During Therapy  [facility] for tasks assessed/performed       Past Medical History:  Diagnosis Date  . Arthritis   . CARPAL TUNNEL SYNDROME, BILATERAL 08/04/2010  . GERD 03/06/2008  . HYPERTENSION 02/10/2007  . Obesity   . OBESITY NOS 02/10/2007    Past Surgical History:  Procedure Laterality Date  . HEMORRHOID SURGERY    . NASAL SINUS SURGERY    . TOTAL KNEE ARTHROPLASTY Left 10/16/2019   Procedure: LEFT TOTAL KNEE ARTHROPLASTY;  Surgeon: Tarry Kos, MD;  [facility]: [facility];  Service: Orthopedics;  Laterality: Left;    There were no vitals filed for this visit.  Subjective Assessment - 11/13/19 0948    Subjective  Pt. stated mild to moderate complaints today.  Feels stiff at times, sometimes less.    Limitations  Standing;Walking    Patient Stated Goals  Indepent walking at PLOF s limitation.  Pt. expressed desire to jog and use elliptical at home.    Pain Score  4     Pain Location  Knee    Pain Orientation  Left    Pain Descriptors / Indicators  Tightness;Throbbing    Pain Type  Acute pain;Surgical pain    Pain Onset  1 to 4 weeks ago                       [facility] Adult PT Treatment/Exercise - 11/13/19 0001      Knee/Hip Exercises: Aerobic   Nustep  L5 10 mins for ROM      Knee/Hip Exercises: Standing   Lateral Step Up  Both;2 sets;10 reps   4 inch step     Knee/Hip Exercises: Seated   Long Arc  Quad  Left;Strengthening;3 sets;15 reps    Long Arc Quad Weight  5 lbs.      Vasopneumatic   Number Minutes Vasopneumatic   10 minutes    Vasopnuematic Location   Knee    Vasopneumatic Pressure  Medium    Vasopneumatic Temperature   34      Manual Therapy   Manual Therapy  Joint mobilization;Passive ROM;Muscle Energy Technique    Joint Mobilization  g2-g3 ap L knee jt mobs    Muscle Energy Technique  Contract/relax in sitting for Lt knee flexion          Balance Exercises - 11/13/19 0953      Balance Exercises: Standing   Tandem Gait  Forward;Retro;Other reps (comment)   10 ft x 5 each way on foam   Retro Gait  Other reps (comment)    Other Standing Exercises  lateral stepping 3 cones x 10 each bilaterally        PT Education - 11/13/19 0959    Education Details  HEP review.  Cues for intervention.  Person(s) Educated  Patient    Methods  Explanation;Verbal cues    Comprehension  Verbalized understanding;Returned demonstration       PT Short Term Goals - 11/03/19 1053      PT SHORT TERM GOAL #1   Title  Patient will demonstrate independent use of home exercise program to maintain progress from in clinic treatments.    Baseline  11/03/2019: dependent on cues/handout    Time  2    Period  Weeks    Status  New    Target Date  11/17/19        PT Long Term Goals - 11/03/19 1047      PT LONG TERM GOAL #1   Title  Patient will demonstrate/report pain at worst less than or equal to 2/10 to facilitate minimal limitation in daily activity secondary to pain symptoms.    Baseline  11/03/2019 : see chart( up to 10 at worst)    Time  8    Period  Weeks    Status  New    Target Date  12/29/19      PT LONG TERM GOAL #2   Title  Patient will demonstrate independent use of home exercise program to facilitate ability to maintain/progress functional gains from skilled physical therapy services.    Time  8    Period  Weeks    Status  New    Target Date  12/29/19      PT  LONG TERM GOAL #3   Title  Patient will demonstrate return to work/recreational activity at previous level of function without limitations secondary due to condition    Time  8    Period  Weeks    Status  New    Target Date  12/29/19      PT LONG TERM GOAL #4   Title  Pt. will demonstrate L knee AROM 0-110 degrees to facilitate usual transfers, walking, stairs at PLOF s limitation.    Time  8    Period  Weeks    Status  New    Target Date  12/29/19      PT LONG TERM GOAL #5   Title  Patient will demonstrate independent ambulation community distances > 300 ft to facilitate community integration at [facility].    Baseline  11/03/2019 : SPC    Time  8    Period  Weeks    Status  New    Target Date  12/29/19      Additional Long Term Goals   Additional Long Term Goals  Yes      PT LONG TERM GOAL #6   Title  Pt. will demonstrate LLE MMT 5/5 throughout to facilitate ability to perform usual work standing, squatting, lifting at PLOF s limitation.    Time  8    Period  Weeks    Status  New    Target Date  12/29/19            Plan - 11/13/19 0959    Clinical Impression Statement  Pt. continues to show mild deviation in gait cycle due to LLE at this time but improving.  Shifted to movement coordination/balance activity in clinic more to address.  Flexion active in supine 100 degrees.    Examination-Activity Limitations  Lift;Carry;Squat;Stairs;Stand;Sit;Transfers;Bed Mobility;Bend    [employer] Activity;Driving;Yard Work    Stability/Clinical Decision Making  Stable/Uncomplicated    Rehab Potential  Good    PT Frequency  3x / week  PT Duration  8 weeks    PT Treatment/Interventions  Electrical Stimulation;Cryotherapy;Gait training;Stair training;Functional mobility training;Therapeutic activities;Therapeutic exercise;Balance training;Neuromuscular re-education;Manual techniques;Patient/family education;Passive range of motion;Dry needling;Joint  Manipulations;Vasopneumatic Device;Taping    PT Next Visit Plan  Continue to progress static and dynamic stability in WB to improve gait on even and uneven surfaces.    PT Home Exercise Plan  Access Code: [email or web address]    Consulted and Agree with Plan of Care  Patient       Patient will benefit from skilled therapeutic intervention in order to improve the following deficits and impairments:  Abnormal gait, Decreased coordination, Decreased range of motion, Difficulty walking, Decreased endurance, Decreased activity tolerance, Pain, Impaired flexibility, Hypomobility, Decreased balance, Decreased mobility, Decreased strength, Increased edema  Visit Diagnosis: Acute pain of left knee  Muscle weakness (generalized)  Difficulty in walking, not elsewhere classified  Localized edema     Problem List Patient Active Problem List   Diagnosis Date Noted  . Status post total left knee replacement 10/16/2019  . Primary osteoarthritis of left knee 09/06/2019  . Health care maintenance 02/24/2019  . Chronic sinusitis 10/04/2018  . CARPAL TUNNEL SYNDROME, BILATERAL 08/04/2010  . GERD 03/06/2008  . Class 2 obesity due to excess calories with body mass index (BMI) of 38.0 to 38.9 in adult 02/10/2007  . Essential hypertension 02/10/2007    Chyrel Masson, PT, DPT, OCS, ATC 11/13/19  10:38 AM    [facility] Physical Therapy [address] [place], [place], [address] Phone: [phone]   Fax:  [phone]  Name: Hailey [name] MRN: [id number] Date of Birth: Mar 17, 1961

## 2019-11-13 NOTE — Telephone Encounter (Signed)
Patient only has one tablet left of Percocet. She requests refill on pain medication, possibly the step down from Percocet.  Please send to [facility] on [address].  CB for pt is [phone]

## 2019-11-14 ENCOUNTER — Other Ambulatory Visit: Payer: Self-pay | Admitting: Physician Assistant

## 2019-11-15 ENCOUNTER — Other Ambulatory Visit: Payer: Self-pay

## 2019-11-15 ENCOUNTER — Encounter: Payer: Self-pay | Admitting: Rehabilitative and Restorative Service Providers"

## 2019-11-15 ENCOUNTER — Ambulatory Visit (INDEPENDENT_AMBULATORY_CARE_PROVIDER_SITE_OTHER): Payer: BC Managed Care – PPO | Admitting: Rehabilitative and Restorative Service Providers"

## 2019-11-15 DIAGNOSIS — R262 Difficulty in walking, not elsewhere classified: Secondary | ICD-10-CM | POA: Diagnosis not present

## 2019-11-15 DIAGNOSIS — R600 Localized edema: Secondary | ICD-10-CM

## 2019-11-15 DIAGNOSIS — M6281 Muscle weakness (generalized): Secondary | ICD-10-CM

## 2019-11-15 DIAGNOSIS — M25562 Pain in left knee: Secondary | ICD-10-CM

## 2019-11-15 NOTE — Therapy (Signed)
[facility] Physical Therapy [address] [place], [place], [address] Phone: [phone]   Fax:  [phone]  Physical Therapy Treatment  Patient Details  Name: Hailey [name] MRN: [id number] Date of Birth: 26-Feb-1961 Referring Provider (PT): Roda Shutters   Encounter Date: 11/15/2019  PT End of Session - 11/15/19 1101    Visit Number  6    Number of Visits  24    Date for PT Re-Evaluation  11/30/19    PT Start Time  1020    PT Stop Time  1110    PT Time Calculation (min)  50 min    Activity Tolerance  Patient tolerated treatment well    Behavior During Therapy  [facility] for tasks assessed/performed       Past Medical History:  Diagnosis Date  . Arthritis   . CARPAL TUNNEL SYNDROME, BILATERAL 08/04/2010  . GERD 03/06/2008  . HYPERTENSION 02/10/2007  . Obesity   . OBESITY NOS 02/10/2007    Past Surgical History:  Procedure Laterality Date  . HEMORRHOID SURGERY    . NASAL SINUS SURGERY    . TOTAL KNEE ARTHROPLASTY Left 10/16/2019   Procedure: LEFT TOTAL KNEE ARTHROPLASTY;  Surgeon: Tarry Kos, MD;  Location: [facility] OR;  Service: Orthopedics;  Laterality: Left;    There were no vitals filed for this visit.  Subjective Assessment - 11/15/19 1025    Subjective  Pt. stated feeling tightness still, "like a band around knee."  Pt. stated symptoms up to 5/10 at worst in last day or so.    Limitations  Standing;Walking    Patient Stated Goals  Indepent walking at PLOF s limitation.  Pt. expressed desire to jog and use elliptical at home.    Currently in Pain?  Yes    Pain Score  5     Pain Location  Knee    Pain Orientation  Left    Pain Descriptors / Indicators  Throbbing;Tightness    Pain Type  Acute pain;Surgical pain    Pain Onset  1 to 4 weeks ago                       [facility] Adult PT Treatment/Exercise - 11/15/19 0001      Neuro Re-ed    Neuro Re-ed Details   tandem ambulation fwd/rev on foam 10 ft x 5 each way, retro step on foam 3 x 10 LLE  posterior      Knee/Hip Exercises: Aerobic   Recumbent Bike  3/4 revolutions 5 second holds for ROM, 6 mins      Knee/Hip Exercises: Standing   Forward Step Up  Both;2 sets;10 reps   6 inch step     Knee/Hip Exercises: Seated   Long Arc Quad  Left;Strengthening;3 sets;15 reps    Long Arc Quad Weight  6 lbs.    Hamstring Curl  Strengthening;Left;3 sets;10 reps   green resistance band     Vasopneumatic   Number Minutes Vasopneumatic   10 minutes    Vasopnuematic Location   Knee    Vasopneumatic Pressure  Medium    Vasopneumatic Temperature   34      Manual Therapy   Manual Therapy  Joint mobilization;Passive ROM;Muscle Energy Technique    Joint Mobilization  g2-g3 ap L knee jt mobs    Muscle Energy Technique  Contract/relax in sitting for Lt knee flexion             PT Education - 11/15/19 1026  Education Details  Cues for intervention techinques in clinic.    Person(s) Educated  Patient    Methods  Explanation;Verbal cues    Comprehension  Verbalized understanding       PT Short Term Goals - 11/03/19 1053      PT SHORT TERM GOAL #1   Title  Patient will demonstrate independent use of home exercise program to maintain progress from in clinic treatments.    Baseline  11/03/2019: dependent on cues/handout    Time  2    Period  Weeks    Status  New    Target Date  11/17/19        PT Long Term Goals - 11/03/19 1047      PT LONG TERM GOAL #1   Title  Patient will demonstrate/report pain at worst less than or equal to 2/10 to facilitate minimal limitation in daily activity secondary to pain symptoms.    Baseline  11/03/2019 : see chart( up to 10 at worst)    Time  8    Period  Weeks    Status  New    Target Date  12/29/19      PT LONG TERM GOAL #2   Title  Patient will demonstrate independent use of home exercise program to facilitate ability to maintain/progress functional gains from skilled physical therapy services.    Time  8    Period  Weeks    Status   New    Target Date  12/29/19      PT LONG TERM GOAL #3   Title  Patient will demonstrate return to work/recreational activity at previous level of function without limitations secondary due to condition    Time  8    Period  Weeks    Status  New    Target Date  12/29/19      PT LONG TERM GOAL #4   Title  Pt. will demonstrate L knee AROM 0-110 degrees to facilitate usual transfers, walking, stairs at PLOF s limitation.    Time  8    Period  Weeks    Status  New    Target Date  12/29/19      PT LONG TERM GOAL #5   Title  Patient will demonstrate independent ambulation community distances > 300 ft to facilitate community integration at [facility].    Baseline  11/03/2019 : SPC    Time  8    Period  Weeks    Status  New    Target Date  12/29/19      Additional Long Term Goals   Additional Long Term Goals  Yes      PT LONG TERM GOAL #6   Title  Pt. will demonstrate LLE MMT 5/5 throughout to facilitate ability to perform usual work standing, squatting, lifting at PLOF s limitation.    Time  8    Period  Weeks    Status  New    Target Date  12/29/19            Plan - 11/15/19 1059    Clinical Impression Statement  Active flexion supine measured at 99 degrees today.  Pt. continue to show progress in overall mobility, strength activity at this time but continue to show necessity for skilled PT services.    Examination-Activity Limitations  Lift;Carry;Squat;Stairs;Stand;Sit;Transfers;Bed Mobility;Bend    [employer] Activity;Driving;Yard Work    Stability/Clinical Decision Making  Stable/Uncomplicated    Rehab Potential  Good  PT Frequency  3x / week    PT Duration  8 weeks    PT Treatment/Interventions  Electrical Stimulation;Cryotherapy;Gait training;Stair training;Functional mobility training;Therapeutic activities;Therapeutic exercise;Balance training;Neuromuscular re-education;Manual techniques;Patient/family education;Passive range of  motion;Dry needling;Joint Manipulations;Vasopneumatic Device;Taping    PT Next Visit Plan  Strength, balance in WB, flexion mobility improvements.    PT Home Exercise Plan  Access Code: [email or web address]    Consulted and Agree with Plan of Care  Patient       Patient will benefit from skilled therapeutic intervention in order to improve the following deficits and impairments:  Abnormal gait, Decreased coordination, Decreased range of motion, Difficulty walking, Decreased endurance, Decreased activity tolerance, Pain, Impaired flexibility, Hypomobility, Decreased balance, Decreased mobility, Decreased strength, Increased edema  Visit Diagnosis: Acute pain of left knee  Muscle weakness (generalized)  Difficulty in walking, not elsewhere classified  Localized edema     Problem List Patient Active Problem List   Diagnosis Date Noted  . Status post total left knee replacement 10/16/2019  . Primary osteoarthritis of left knee 09/06/2019  . Health care maintenance 02/24/2019  . Chronic sinusitis 10/04/2018  . CARPAL TUNNEL SYNDROME, BILATERAL 08/04/2010  . GERD 03/06/2008  . Class 2 obesity due to excess calories with body mass index (BMI) of 38.0 to 38.9 in adult 02/10/2007  . Essential hypertension 02/10/2007    Chyrel Masson, PT, DPT, OCS, ATC 11/15/19  11:02 AM    [facility] Physical Therapy [address] [place], [place], [address] Phone: [phone]   Fax:  [phone]  Name: Hailey [name] MRN: [id number] Date of Birth: 06-04-1961

## 2019-11-17 ENCOUNTER — Encounter: Payer: Self-pay | Admitting: Rehabilitative and Restorative Service Providers"

## 2019-11-17 ENCOUNTER — Ambulatory Visit (INDEPENDENT_AMBULATORY_CARE_PROVIDER_SITE_OTHER): Payer: BC Managed Care – PPO | Admitting: Rehabilitative and Restorative Service Providers"

## 2019-11-17 ENCOUNTER — Other Ambulatory Visit: Payer: Self-pay

## 2019-11-17 DIAGNOSIS — M6281 Muscle weakness (generalized): Secondary | ICD-10-CM

## 2019-11-17 DIAGNOSIS — R600 Localized edema: Secondary | ICD-10-CM | POA: Diagnosis not present

## 2019-11-17 DIAGNOSIS — M25562 Pain in left knee: Secondary | ICD-10-CM

## 2019-11-17 DIAGNOSIS — R262 Difficulty in walking, not elsewhere classified: Secondary | ICD-10-CM | POA: Diagnosis not present

## 2019-11-17 NOTE — Therapy (Signed)
[facility] Physical Therapy [address] [place], [place], [address] Phone: [phone]   Fax:  [phone]  Physical Therapy Treatment  Patient Details  Name: Hailey [name] MRN: [id number] Date of Birth: 1961-07-10 Referring Provider (PT): Roda Shutters   Encounter Date: 11/17/2019  PT End of Session - 11/17/19 1055    Visit Number  7    Number of Visits  24    Date for PT Re-Evaluation  11/30/19    PT Start Time  1014    PT Stop Time  1105    PT Time Calculation (min)  51 min    Activity Tolerance  Patient tolerated treatment well    Behavior During Therapy  [facility] for tasks assessed/performed       Past Medical History:  Diagnosis Date  . Arthritis   . CARPAL TUNNEL SYNDROME, BILATERAL 08/04/2010  . GERD 03/06/2008  . HYPERTENSION 02/10/2007  . Obesity   . OBESITY NOS 02/10/2007    Past Surgical History:  Procedure Laterality Date  . HEMORRHOID SURGERY    . NASAL SINUS SURGERY    . TOTAL KNEE ARTHROPLASTY Left 10/16/2019   Procedure: LEFT TOTAL KNEE ARTHROPLASTY;  Surgeon: Tarry Kos, MD;  Location: [facility] OR;  Service: Orthopedics;  Laterality: Left;    There were no vitals filed for this visit.  Subjective Assessment - 11/17/19 1026    Subjective  Pt. indicated feeling some pain 6/10 or so last night, this morning.  Pt. indicated doing HEP 2-3x/day with some soreness post activity.    Limitations  Standing;Walking    Patient Stated Goals  Indepent walking at PLOF s limitation.  Pt. expressed desire to jog and use elliptical at home.    Currently in Pain?  Yes    Pain Score  4     Pain Location  Knee    Pain Orientation  Left    Pain Descriptors / Indicators  Throbbing;Tightness    Pain Type  Acute pain;Surgical pain    Pain Onset  1 to 4 weeks ago                       [facility] Adult PT Treatment/Exercise - 11/17/19 0001      Neuro Re-ed    Neuro Re-ed Details   SLS c cone step over/return x 20 bilaterally, lateral stepping 3 cones x 10  bilat, tandem ambulaton on foam fwd/rev 10 ft x 6 each way       Knee/Hip Exercises: Aerobic   Recumbent Bike  3/4 revolutions 5 second holds for ROM, 7 mins      Knee/Hip Exercises: Standing   Lateral Step Up  Left;2 sets;15 reps   4 inch step, focus on eccentric lowering     Knee/Hip Exercises: Seated   Hamstring Curl  Strengthening;Left;3 sets;15 reps   green band   Sit to Sand  2 sets;10 reps;without UE support      Vasopneumatic   Number Minutes Vasopneumatic   10 minutes    Vasopnuematic Location   Knee    Vasopneumatic Pressure  Medium    Vasopneumatic Temperature   34      Manual Therapy   Manual Therapy  Joint mobilization;Passive ROM;Muscle Energy Technique    Joint Mobilization  g2-g3 ap L knee jt mobs    Muscle Energy Technique  Contract/relax in supine for Lt knee flexion             PT Education - 11/17/19 1027  Education Details  Education on reduction of HEP frequency to avoid over loading.    Person(s) Educated  Patient    Methods  Explanation    Comprehension  Verbalized understanding       PT Short Term Goals - 11/03/19 1053      PT SHORT TERM GOAL #1   Title  Patient will demonstrate independent use of home exercise program to maintain progress from in clinic treatments.    Baseline  11/03/2019: dependent on cues/handout    Time  2    Period  Weeks    Status  New    Target Date  11/17/19        PT Long Term Goals - 11/03/19 1047      PT LONG TERM GOAL #1   Title  Patient will demonstrate/report pain at worst less than or equal to 2/10 to facilitate minimal limitation in daily activity secondary to pain symptoms.    Baseline  11/03/2019 : see chart( up to 10 at worst)    Time  8    Period  Weeks    Status  New    Target Date  12/29/19      PT LONG TERM GOAL #2   Title  Patient will demonstrate independent use of home exercise program to facilitate ability to maintain/progress functional gains from skilled physical therapy services.     Time  8    Period  Weeks    Status  New    Target Date  12/29/19      PT LONG TERM GOAL #3   Title  Patient will demonstrate return to work/recreational activity at previous level of function without limitations secondary due to condition    Time  8    Period  Weeks    Status  New    Target Date  12/29/19      PT LONG TERM GOAL #4   Title  Pt. will demonstrate L knee AROM 0-110 degrees to facilitate usual transfers, walking, stairs at PLOF s limitation.    Time  8    Period  Weeks    Status  New    Target Date  12/29/19      PT LONG TERM GOAL #5   Title  Patient will demonstrate independent ambulation community distances > 300 ft to facilitate community integration at [facility].    Baseline  11/03/2019 : SPC    Time  8    Period  Weeks    Status  New    Target Date  12/29/19      Additional Long Term Goals   Additional Long Term Goals  Yes      PT LONG TERM GOAL #6   Title  Pt. will demonstrate LLE MMT 5/5 throughout to facilitate ability to perform usual work standing, squatting, lifting at PLOF s limitation.    Time  8    Period  Weeks    Status  New    Target Date  12/29/19            Plan - 11/17/19 1054    Clinical Impression Statement  Pt. continues to show mild symptoms and restriciton into Lt knee flexion in NWB and WB activity at this time that impairs usual functional movements from PLOF.  Continued skilled PT services indicated to continue to improve towards goals.    Examination-Activity Limitations  Lift;Carry;Squat;Stairs;Stand;Sit;Transfers;Bed Mobility;Bend    [employer] Activity;Driving;Yard Work    [employer]  Stable/Uncomplicated    Rehab Potential  Good    PT Frequency  3x / week    PT Duration  8 weeks    PT Treatment/Interventions  Electrical Stimulation;Cryotherapy;Gait training;Stair training;Functional mobility training;Therapeutic activities;Therapeutic exercise;Balance  training;Neuromuscular re-education;Manual techniques;Patient/family education;Passive range of motion;Dry needling;Joint Manipulations;Vasopneumatic Device;Taping    PT Next Visit Plan  Continue c POC.  Recommend progress note next week prior to MD visit March 2    PT Home Exercise Plan  Access Code: [email or web address]    Consulted and Agree with Plan of Care  Patient       Patient will benefit from skilled therapeutic intervention in order to improve the following deficits and impairments:  Abnormal gait, Decreased coordination, Decreased range of motion, Difficulty walking, Decreased endurance, Decreased activity tolerance, Pain, Impaired flexibility, Hypomobility, Decreased balance, Decreased mobility, Decreased strength, Increased edema  Visit Diagnosis: Acute pain of left knee  Muscle weakness (generalized)  Difficulty in walking, not elsewhere classified  Localized edema     Problem List Patient Active Problem List   Diagnosis Date Noted  . Status post total left knee replacement 10/16/2019  . Primary osteoarthritis of left knee 09/06/2019  . Health care maintenance 02/24/2019  . Chronic sinusitis 10/04/2018  . CARPAL TUNNEL SYNDROME, BILATERAL 08/04/2010  . GERD 03/06/2008  . Class 2 obesity due to excess calories with body mass index (BMI) of 38.0 to 38.9 in adult 02/10/2007  . Essential hypertension 02/10/2007    Chyrel Masson, PT, DPT, OCS, ATC 11/17/19  10:57 AM     [facility] Physical Therapy [address] [place], [place], [address] Phone: [phone]   Fax:  [phone]  Name: Hailey [name] MRN: [id number] Date of Birth: Nov 11, 1960

## 2019-11-18 ENCOUNTER — Other Ambulatory Visit: Payer: Self-pay | Admitting: Orthopaedic Surgery

## 2019-11-20 ENCOUNTER — Other Ambulatory Visit: Payer: Self-pay

## 2019-11-20 ENCOUNTER — Telehealth: Payer: Self-pay | Admitting: Orthopaedic Surgery

## 2019-11-20 ENCOUNTER — Encounter: Payer: Self-pay | Admitting: Rehabilitative and Restorative Service Providers"

## 2019-11-20 ENCOUNTER — Ambulatory Visit (INDEPENDENT_AMBULATORY_CARE_PROVIDER_SITE_OTHER): Payer: BC Managed Care – PPO | Admitting: Rehabilitative and Restorative Service Providers"

## 2019-11-20 ENCOUNTER — Other Ambulatory Visit: Payer: Self-pay | Admitting: Physician Assistant

## 2019-11-20 DIAGNOSIS — M6281 Muscle weakness (generalized): Secondary | ICD-10-CM

## 2019-11-20 DIAGNOSIS — R262 Difficulty in walking, not elsewhere classified: Secondary | ICD-10-CM

## 2019-11-20 DIAGNOSIS — R600 Localized edema: Secondary | ICD-10-CM

## 2019-11-20 DIAGNOSIS — M25562 Pain in left knee: Secondary | ICD-10-CM

## 2019-11-20 MED ORDER — HYDROCODONE-ACETAMINOPHEN 5-325 MG PO TABS: 1.0000 | ORAL_TABLET | Freq: Three times a day (TID) | ORAL | 0 refills | Status: DC | PRN

## 2019-11-20 NOTE — Therapy (Signed)
[facility] Physical Therapy [address] [place], [place], [address] Phone: [phone]   Fax:  [phone]  Physical Therapy Treatment  Patient Details  Name: Hailey [name] MRN: [id number] Date of Birth: 1960/11/25 Referring Provider (PT): Roda Shutters   Encounter Date: 11/20/2019  PT End of Session - 11/20/19 1043    Visit Number  8    Number of Visits  24    Date for PT Re-Evaluation  11/30/19    PT Start Time  1014    PT Stop Time  1106    PT Time Calculation (min)  52 min    Activity Tolerance  Patient tolerated treatment well    Behavior During Therapy  [facility] for tasks assessed/performed       Past Medical History:  Diagnosis Date  . Arthritis   . CARPAL TUNNEL SYNDROME, BILATERAL 08/04/2010  . GERD 03/06/2008  . HYPERTENSION 02/10/2007  . Obesity   . OBESITY NOS 02/10/2007    Past Surgical History:  Procedure Laterality Date  . HEMORRHOID SURGERY    . NASAL SINUS SURGERY    . TOTAL KNEE ARTHROPLASTY Left 10/16/2019   Procedure: LEFT TOTAL KNEE ARTHROPLASTY;  Surgeon: Tarry Kos, MD;  Location: [facility] OR;  Service: Orthopedics;  Laterality: Left;    There were no vitals filed for this visit.  Subjective Assessment - 11/20/19 1019    Subjective  Pt. reported no pain upon arrival today.  Feels like a brick on knee when waking up though.  Trying to do new balance activity at home as well as bike.    Limitations  Standing;Walking    Patient Stated Goals  Indepent walking at PLOF s limitation.  Pt. expressed desire to jog and use elliptical at home.    Currently in Pain?  No/denies    Pain Score  0-No pain    Pain Location  Knee    Pain Orientation  Left    Pain Onset  1 to 4 weeks ago                       [facility] Adult PT Treatment/Exercise - 11/20/19 0001      Neuro Re-ed    Neuro Re-ed Details   SLS c 7 inch cone step over/return x 20 bilaterally, lateral stepping 3 cones x 10 bilat, reverse ambulation 15 ft x 5       Knee/Hip  Exercises: Aerobic   Nustep  L6 10 mins for ROM      Knee/Hip Exercises: Standing   Lateral Step Up  Left;2 sets;10 reps   4 inch   Forward Step Up  2 sets;10 reps   6 inch   Functional Squat  2 sets;10 reps      Knee/Hip Exercises: Seated   Long Arc Quad  Left;Strengthening;3 sets;15 reps    Long Arc Quad Weight  7 lbs.      Vasopneumatic   Number Minutes Vasopneumatic   10 minutes    Vasopnuematic Location   Knee    Vasopneumatic Pressure  Medium    Vasopneumatic Temperature   34      Manual Therapy   Manual Therapy  Joint mobilization;Passive ROM;Muscle Energy Technique    Joint Mobilization  g2-g3 ap L knee jt mobs    Muscle Energy Technique  Contract/relax in supine for Lt knee flexion             PT Education - 11/20/19 1021    Education Details  Various cues for intervention techniques.    Person(s) Educated  Patient    Methods  Explanation;Verbal cues    Comprehension  Verbalized understanding;Returned demonstration       PT Short Term Goals - 11/03/19 1053      PT SHORT TERM GOAL #1   Title  Patient will demonstrate independent use of home exercise program to maintain progress from in clinic treatments.    Baseline  11/03/2019: dependent on cues/handout    Time  2    Period  Weeks    Status  New    Target Date  11/17/19        PT Long Term Goals - 11/03/19 1047      PT LONG TERM GOAL #1   Title  Patient will demonstrate/report pain at worst less than or equal to 2/10 to facilitate minimal limitation in daily activity secondary to pain symptoms.    Baseline  11/03/2019 : see chart( up to 10 at worst)    Time  8    Period  Weeks    Status  New    Target Date  12/29/19      PT LONG TERM GOAL #2   Title  Patient will demonstrate independent use of home exercise program to facilitate ability to maintain/progress functional gains from skilled physical therapy services.    Time  8    Period  Weeks    Status  New    Target Date  12/29/19      PT  LONG TERM GOAL #3   Title  Patient will demonstrate return to work/recreational activity at previous level of function without limitations secondary due to condition    Time  8    Period  Weeks    Status  New    Target Date  12/29/19      PT LONG TERM GOAL #4   Title  Pt. will demonstrate L knee AROM 0-110 degrees to facilitate usual transfers, walking, stairs at PLOF s limitation.    Time  8    Period  Weeks    Status  New    Target Date  12/29/19      PT LONG TERM GOAL #5   Title  Patient will demonstrate independent ambulation community distances > 300 ft to facilitate community integration at [facility].    Baseline  11/03/2019 : SPC    Time  8    Period  Weeks    Status  New    Target Date  12/29/19      Additional Long Term Goals   Additional Long Term Goals  Yes      PT LONG TERM GOAL #6   Title  Pt. will demonstrate LLE MMT 5/5 throughout to facilitate ability to perform usual work standing, squatting, lifting at PLOF s limitation.    Time  8    Period  Weeks    Status  New    Target Date  12/29/19            Plan - 11/20/19 1042    Clinical Impression Statement  Pt. continues to make progress in mobility overall of Lt knee, as well as improving WB activity such as stair control.  Continued skilled PT services indicated to continue to progress towards goals.    Examination-Activity Limitations  Lift;Carry;Squat;Stairs;Stand;Sit;Transfers;Bed Mobility;Bend    [employer] Activity;Driving;Yard Work    Stability/Clinical Decision Making  Stable/Uncomplicated    Rehab Potential  Good    PT  Frequency  3x / week    PT Duration  8 weeks    PT Treatment/Interventions  Electrical Stimulation;Cryotherapy;Gait training;Stair training;Functional mobility training;Therapeutic activities;Therapeutic exercise;Balance training;Neuromuscular re-education;Manual techniques;Patient/family education;Passive range of motion;Dry needling;Joint  Manipulations;Vasopneumatic Device;Taping    PT Next Visit Plan  Progress note next visit for upcoming MD visit.    PT Home Exercise Plan  Access Code: [email or web address]    Consulted and Agree with Plan of Care  Patient       Patient will benefit from skilled therapeutic intervention in order to improve the following deficits and impairments:  Abnormal gait, Decreased coordination, Decreased range of motion, Difficulty walking, Decreased endurance, Decreased activity tolerance, Pain, Impaired flexibility, Hypomobility, Decreased balance, Decreased mobility, Decreased strength, Increased edema  Visit Diagnosis: Acute pain of left knee  Muscle weakness (generalized)  Difficulty in walking, not elsewhere classified  Localized edema     Problem List Patient Active Problem List   Diagnosis Date Noted  . Status post total left knee replacement 10/16/2019  . Primary osteoarthritis of left knee 09/06/2019  . Health care maintenance 02/24/2019  . Chronic sinusitis 10/04/2018  . CARPAL TUNNEL SYNDROME, BILATERAL 08/04/2010  . GERD 03/06/2008  . Class 2 obesity due to excess calories with body mass index (BMI) of 38.0 to 38.9 in adult 02/10/2007  . Essential hypertension 02/10/2007    Chyrel Masson, PT, DPT, OCS, ATC 11/20/19  10:58 AM    [facility] Physical Therapy [address] [place], [place], [address] Phone: [phone]   Fax:  [phone]  Name: Hailey [name] MRN: [id number] Date of Birth: 09-Jan-1961

## 2019-11-20 NOTE — Telephone Encounter (Signed)
No need to refill.

## 2019-11-20 NOTE — Telephone Encounter (Signed)
Just sent in

## 2019-11-20 NOTE — Telephone Encounter (Signed)
Patient requesting refill of hydrocodone medication. Please send to pharmacy on file. Patient phone number is [phone].

## 2019-11-22 ENCOUNTER — Other Ambulatory Visit: Payer: Self-pay

## 2019-11-22 ENCOUNTER — Ambulatory Visit (INDEPENDENT_AMBULATORY_CARE_PROVIDER_SITE_OTHER): Payer: BC Managed Care – PPO | Admitting: Rehabilitative and Restorative Service Providers"

## 2019-11-22 ENCOUNTER — Encounter: Payer: Self-pay | Admitting: Rehabilitative and Restorative Service Providers"

## 2019-11-22 DIAGNOSIS — R262 Difficulty in walking, not elsewhere classified: Secondary | ICD-10-CM | POA: Diagnosis not present

## 2019-11-22 DIAGNOSIS — M6281 Muscle weakness (generalized): Secondary | ICD-10-CM | POA: Diagnosis not present

## 2019-11-22 DIAGNOSIS — M25562 Pain in left knee: Secondary | ICD-10-CM

## 2019-11-22 DIAGNOSIS — R600 Localized edema: Secondary | ICD-10-CM | POA: Diagnosis not present

## 2019-11-22 NOTE — Therapy (Signed)
[place], [place], [address] Phone: [phone]   Fax:  [phone]  Physical Therapy Treatment/Progress Note  Patient Details  Name: Hailey [name] MRN: [id number] Date of Birth: 05-04-1961 Referring Provider (PT): Erlinda Hong   Encounter Date: 11/22/2019  PT End of Session - 11/22/19 1048    Visit Number  9    Number of Visits  24    Date for PT Re-Evaluation  12/30/19    PT Start Time  1012    PT Stop Time  1105    PT Time Calculation (min)  53 min    Activity Tolerance  Patient tolerated treatment well    Behavior During Therapy  [facility] for tasks assessed/performed       Past Medical History:  Diagnosis Date  . Arthritis   . CARPAL TUNNEL SYNDROME, BILATERAL 08/04/2010  . GERD 03/06/2008  . HYPERTENSION 02/10/2007  . Obesity   . OBESITY NOS 02/10/2007    Past Surgical History:  Procedure Laterality Date  . HEMORRHOID SURGERY    . NASAL SINUS SURGERY    . TOTAL KNEE ARTHROPLASTY Left 10/16/2019   Procedure: LEFT TOTAL KNEE ARTHROPLASTY;  Surgeon: Leandrew Koyanagi, MD;  Location: [facility];  Service: Orthopedics;  Laterality: Left;    There were no vitals filed for this visit.  Subjective Assessment - 11/22/19 1013    Subjective  Pt. stated no pain upon arrival today.  Pt. indicated still having pain at night as well as full bending.  Pt. rated pain at most 5/10 or so.  Global rating of change rated at +6 at this time.    Limitations  Standing;Walking    Patient Stated Goals  Indepent walking at PLOF s limitation.  Pt. expressed desire to jog and use elliptical at home.    Currently in Pain?  No/denies    Pain Score  0-No pain    Pain Location  Knee    Pain Orientation  Left    Pain Onset  1 to 4 weeks ago         [facility] PT Assessment - 11/22/19 0001      Assessment   Medical Diagnosis  L TKA     Referring Provider (PT)  Xu    Onset Date/Surgical Date  10/16/19    Hand Dominance  Left    Next MD Visit  11/28/2019    Prior Therapy  Home health for TKA      Prior Function   Level of Independence  Independent      Observation/Other Assessments   Observations  Mild Visible LLE edema from knee to foot/ankle compared to R      Functional Tests   Functional tests  Single leg stance      Single Leg Stance   Comments  LLE SLS 10 seconds      AROM   Left Knee Extension  0    Left Knee Flexion  100      PROM   Left Knee Extension  0    Left Knee Flexion  105      Strength   Left Hip Flexion  5/5    Left Knee Flexion  5/5    Left Knee Extension  4+/5    Right/Left Ankle  Left    Left Ankle Dorsiflexion  5/5      Palpation   Patella mobility  Mild limitation superior/inferior      Ambulation/Gait   Gait Comments  Mild antalgic  gait noted independently in clinic on level surface c decreased toe off progression on LLE                   [facility] Adult PT Treatment/Exercise - 11/22/19 0001      Knee/Hip Exercises: Stretches   Gastroc Stretch  Both;5 reps;30 seconds   incline board L2   Other Knee/Hip Stretches  supine heel slide c strap 10 second hold x 10      Knee/Hip Exercises: Aerobic   Stationary Bike  3/4 revolution 10 mins for mobility      Knee/Hip Exercises: Standing   Forward Step Up  Left;3 sets;10 reps   6 inch step   Functional Squat  2 sets;10 reps    Other Standing Knee Exercises  Reverse ambulation 15 ft x 5     Other Standing Knee Exercises  lateral stepping 3 cones x 6 bilaterally      Knee/Hip Exercises: Seated   Hamstring Curl  Strengthening;3 sets;10 reps   green band     Knee/Hip Exercises: Supine   Straight Leg Raises  Strengthening;Left;2 sets;10 reps      Vasopneumatic   Number Minutes Vasopneumatic   10 minutes    Vasopnuematic Location   Knee    Vasopneumatic Pressure  Medium    Vasopneumatic Temperature   34      Manual Therapy   Manual Therapy  Joint mobilization;Passive ROM;Muscle Energy Technique    Joint Mobilization  g2-g3 ap L knee jt  mobs    Muscle Energy Technique  Contract/relax in supine for Lt knee flexion             PT Education - 11/22/19 1015    Education Details  Verbal cues and demonstration for intervention at times.    Person(s) Educated  Patient    Methods  Explanation;Verbal cues;Demonstration    Comprehension  Returned demonstration;Verbalized understanding       PT Short Term Goals - 11/22/19 1016      PT SHORT TERM GOAL #1   Title  Patient will demonstrate independent use of home exercise program to maintain progress from in clinic treatments.    Baseline  11/22/2019: independent    Time  2    Period  Weeks    Status  Achieved    Target Date  11/17/19        PT Long Term Goals - 11/22/19 1016      PT LONG TERM GOAL #1   Title  Patient will demonstrate/report pain at worst less than or equal to 2/10 to facilitate minimal limitation in daily activity secondary to pain symptoms.    Baseline  11/22/2019 : 5/10 at worst    Time  8    Period  Weeks    Status  Partially Met    Target Date  12/29/19      PT LONG TERM GOAL #2   Title  Patient will demonstrate independent use of home exercise program to facilitate ability to maintain/progress functional gains from skilled physical therapy services.    Baseline  11/22/2019: independent    Time  8    Period  Weeks    Status  Achieved      PT LONG TERM GOAL #3   Title  Patient will demonstrate return to work/recreational activity at previous level of function without limitations secondary due to condition    Time  8    Period  Weeks    Status  Not Met  Target Date  12/29/19      PT LONG TERM GOAL #4   Title  Pt. will demonstrate L knee AROM 0-110 degrees to facilitate usual transfers, walking, stairs at PLOF s limitation.    Baseline  11/22/2019: 0-100    Time  8    Period  Weeks    Status  Partially Met    Target Date  12/29/19      PT LONG TERM GOAL #5   Title  Patient will demonstrate independent ambulation community distances  > 300 ft to facilitate community integration at [facility].    Baseline  11/22/2019: independent in clinic on even surfaces, uses cane intermittently in public    Time  8    Period  Weeks    Status  Partially Met    Target Date  12/29/19      PT LONG TERM GOAL #6   Title  Pt. will demonstrate LLE MMT 5/5 throughout to facilitate ability to perform usual work standing, squatting, lifting at PLOF s limitation.    Baseline  11/22/2019: Extension 4+/5    Time  8    Period  Weeks    Status  Partially Met    Target Date  12/29/19            Plan - 11/22/19 1018    Clinical Impression Statement  Pt. has demonstrated and reported improvement in symptoms reported (5/10 at worst), as well as reporting Global Rating of Change +6 at this time.  See objective data showing improvements in all areas including mobility, strength, movement coordination and ambulation to this point.  Continued skilled PT services indicated at this time to continue progression towards goals including work.    Examination-Activity Limitations  Lift;Carry;Squat;Stairs;Stand;Sit;Transfers;Bed Mobility;Bend    [employer] Activity;Driving;Yard Work    Stability/Clinical Decision Making  Stable/Uncomplicated    Rehab Potential  Good    PT Frequency  2x / week    PT Duration  8 weeks    PT Treatment/Interventions  Electrical Stimulation;Cryotherapy;Gait training;Stair training;Functional mobility training;Therapeutic activities;Therapeutic exercise;Balance training;Neuromuscular re-education;Manual techniques;Patient/family education;Passive range of motion;Dry needling;Joint Manipulations;Vasopneumatic Device;Taping    PT Next Visit Plan  MD visit, continuation of POC.    PT Home Exercise Plan  Access Code: [email or web address]    Consulted and Agree with Plan of Care  Patient       Patient will benefit from skilled therapeutic intervention in order to improve the following deficits and impairments:   Abnormal gait, Decreased coordination, Decreased range of motion, Difficulty walking, Decreased endurance, Decreased activity tolerance, Pain, Impaired flexibility, Hypomobility, Decreased balance, Decreased mobility, Decreased strength, Increased edema  Visit Diagnosis: Acute pain of left knee  Muscle weakness (generalized)  Difficulty in walking, not elsewhere classified  Localized edema     Problem List Patient Active Problem List   Diagnosis Date Noted  . Status post total left knee replacement 10/16/2019  . Primary osteoarthritis of left knee 09/06/2019  . Health care maintenance 02/24/2019  . Chronic sinusitis 10/04/2018  . CARPAL TUNNEL SYNDROME, BILATERAL 08/04/2010  . GERD 03/06/2008  . Class 2 obesity due to excess calories with body mass index (BMI) of 38.0 to 38.9 in adult 02/10/2007  . Essential hypertension 02/10/2007    Scot Jun, PT, DPT, OCS, ATC 11/22/19  10:58 AM    [facility] [address] [place], [place], [address] Phone: [phone]   Fax:  [phone]  Name: CHERYAL SALAS MRN: [id number] Date of Birth: Aug 30, 1961

## 2019-11-24 ENCOUNTER — Encounter: Payer: Self-pay | Admitting: Rehabilitative and Restorative Service Providers"

## 2019-11-24 ENCOUNTER — Ambulatory Visit (INDEPENDENT_AMBULATORY_CARE_PROVIDER_SITE_OTHER): Payer: BC Managed Care – PPO | Admitting: Rehabilitative and Restorative Service Providers"

## 2019-11-24 ENCOUNTER — Other Ambulatory Visit: Payer: Self-pay

## 2019-11-24 DIAGNOSIS — M6281 Muscle weakness (generalized): Secondary | ICD-10-CM | POA: Diagnosis not present

## 2019-11-24 DIAGNOSIS — R262 Difficulty in walking, not elsewhere classified: Secondary | ICD-10-CM | POA: Diagnosis not present

## 2019-11-24 DIAGNOSIS — R600 Localized edema: Secondary | ICD-10-CM

## 2019-11-24 DIAGNOSIS — M25562 Pain in left knee: Secondary | ICD-10-CM

## 2019-11-24 NOTE — Therapy (Signed)
[facility] [address] [place], [place], [address] Phone: [phone]   Fax:  [phone]  Physical Therapy Treatment  Patient Details  Name: Hailey [name] MRN: [id number] Date of Birth: 05/09/1961 Referring Provider (PT): Xu   Encounter Date: 11/24/2019  PT End of Session - 11/24/19 1029    Visit Number  10    Number of Visits  24    Date for PT Re-Evaluation  12/30/19    PT Start Time  1009    PT Stop Time  1101    PT Time Calculation (min)  52 min    Activity Tolerance  Patient tolerated treatment well    Behavior During Therapy  [facility] for tasks assessed/performed       Past Medical History:  Diagnosis Date  . Arthritis   . CARPAL TUNNEL SYNDROME, BILATERAL 08/04/2010  . GERD 03/06/2008  . HYPERTENSION 02/10/2007  . Obesity   . OBESITY NOS 02/10/2007    Past Surgical History:  Procedure Laterality Date  . HEMORRHOID SURGERY    . NASAL SINUS SURGERY    . TOTAL KNEE ARTHROPLASTY Left 10/16/2019   Procedure: LEFT TOTAL KNEE ARTHROPLASTY;  Surgeon: Xu, Naiping M, MD;  Location: [facility];  Service: Orthopedics;  Laterality: Left;    There were no vitals filed for this visit.  Subjective Assessment - 11/24/19 1011    Subjective  Pt. indicated going up and down stairs into buildling today at clinic.  Pt. stated some fatigue after house/yard work yesterday, was sore.    Limitations  Standing;Walking    Patient Stated Goals  Indepent walking at PLOF s limitation.  Pt. expressed desire to jog and use elliptical at home.    Currently in Pain?  No/denies    Pain Score  0-No pain    Pain Location  Knee    Pain Orientation  Left    Pain Onset  1 to 4 weeks ago                       [facility] Adult PT Treatment/Exercise - 11/24/19 0001      Neuro Re-ed    Neuro Re-ed Details   SLS on foam 30 sec x 5 bilaterally, lateral stepping 3 cones x 10 bilat, star excursion 5 cones x 6 bilaterally      Knee/Hip Exercises: Aerobic   Stationary Bike  10 mins c occasional full revolution      Knee/Hip Exercises: Standing   Step Down  Both;2 sets;10 reps   lateral 4 inch step   Functional Squat  3 sets;10 reps   10 lbs     Knee/Hip Exercises: Seated   Long Arc Quad  Strengthening;3 sets;15 reps    Long Arc Quad Weight  8 lbs.    Hamstring Curl  Strengthening;3 sets;10 reps   blue band     Vasopneumatic   Number Minutes Vasopneumatic   10 minutes    Vasopnuematic Location   Knee    Vasopneumatic Pressure  Medium    Vasopneumatic Temperature   34      Manual Therapy   Manual Therapy  Joint mobilization;Passive ROM;Muscle Energy Technique    Joint Mobilization  g2-g3 ap L knee jt mobs    Muscle Energy Technique  Contract/relax in seated for Lt knee flexion             PT Education - 11/24/19 1028    Education Details  Occasional intervention cue.      Person(s) Educated  Patient    Methods  Explanation;Verbal cues;Tactile cues    Comprehension  Verbalized understanding;Returned demonstration       PT Short Term Goals - 11/22/19 1016      PT SHORT TERM GOAL #1   Title  Patient will demonstrate independent use of home exercise program to maintain progress from in clinic treatments.    Baseline  11/22/2019: independent    Time  2    Period  Weeks    Status  Achieved    Target Date  11/17/19        PT Long Term Goals - 11/22/19 1016      PT LONG TERM GOAL #1   Title  Patient will demonstrate/report pain at worst less than or equal to 2/10 to facilitate minimal limitation in daily activity secondary to pain symptoms.    Baseline  11/22/2019 : 5/10 at worst    Time  8    Period  Weeks    Status  Partially Met    Target Date  12/29/19      PT LONG TERM GOAL #2   Title  Patient will demonstrate independent use of home exercise program to facilitate ability to maintain/progress functional gains from skilled physical therapy services.    Baseline  11/22/2019: independent    Time  8    Period  Weeks     Status  Achieved      PT LONG TERM GOAL #3   Title  Patient will demonstrate return to work/recreational activity at previous level of function without limitations secondary due to condition    Time  8    Period  Weeks    Status  Not Met    Target Date  12/29/19      PT LONG TERM GOAL #4   Title  Pt. will demonstrate L knee AROM 0-110 degrees to facilitate usual transfers, walking, stairs at PLOF s limitation.    Baseline  11/22/2019: 0-100    Time  8    Period  Weeks    Status  Partially Met    Target Date  12/29/19      PT LONG TERM GOAL #5   Title  Patient will demonstrate independent ambulation community distances > 300 ft to facilitate community integration at [facility].    Baseline  11/22/2019: independent in clinic on even surfaces, uses cane intermittently in public    Time  8    Period  Weeks    Status  Partially Met    Target Date  12/29/19      PT LONG TERM GOAL #6   Title  Pt. will demonstrate LLE MMT 5/5 throughout to facilitate ability to perform usual work standing, squatting, lifting at PLOF s limitation.    Baseline  11/22/2019: Extension 4+/5    Time  8    Period  Weeks    Status  Partially Met    Target Date  12/29/19            Plan - 11/24/19 1028    Clinical Impression Statement  Making improved knee flexion in functional movements today.  Mild tenderness at incision (addressed c cross friction massage for HEP).    Examination-Activity Limitations  Lift;Carry;Squat;Stairs;Stand;Sit;Transfers;Bed Mobility;Bend    [employer] Activity;Driving;Yard Work    Stability/Clinical Decision Making  Stable/Uncomplicated    Rehab Potential  Good    PT Frequency  2x / week    PT Duration  8 weeks  PT Treatment/Interventions  Electrical Stimulation;Cryotherapy;Gait training;Stair training;Functional mobility training;Therapeutic activities;Therapeutic exercise;Balance training;Neuromuscular re-education;Manual  techniques;Patient/family education;Passive range of motion;Dry needling;Joint Manipulations;Vasopneumatic Device;Taping    PT Next Visit Plan  Continue strength, movement coordination improvement plan.    PT Home Exercise Plan  Access Code: [email or web address]    Consulted and Agree with Plan of Care  Patient       Patient will benefit from skilled therapeutic intervention in order to improve the following deficits and impairments:  Abnormal gait, Decreased coordination, Decreased range of motion, Difficulty walking, Decreased endurance, Decreased activity tolerance, Pain, Impaired flexibility, Hypomobility, Decreased balance, Decreased mobility, Decreased strength, Increased edema  Visit Diagnosis: Acute pain of left knee  Muscle weakness (generalized)  Difficulty in walking, not elsewhere classified  Localized edema     Problem List Patient Active Problem List   Diagnosis Date Noted  . Status post total left knee replacement 10/16/2019  . Primary osteoarthritis of left knee 09/06/2019  . Health care maintenance 02/24/2019  . Chronic sinusitis 10/04/2018  . CARPAL TUNNEL SYNDROME, BILATERAL 08/04/2010  . GERD 03/06/2008  . Class 2 obesity due to excess calories with body mass index (BMI) of 38.0 to 38.9 in adult 02/10/2007  . Essential hypertension 02/10/2007    Michael Wright, PT, DPT, OCS, ATC 11/24/19  10:52 AM    [facility] [address] [place], [place], [address] Phone: [phone]   Fax:  [phone]  Name: Marlon P Sarchet MRN: [id number] Date of Birth: 01/05/1961   

## 2019-11-28 ENCOUNTER — Ambulatory Visit (INDEPENDENT_AMBULATORY_CARE_PROVIDER_SITE_OTHER): Payer: BC Managed Care – PPO | Admitting: Orthopaedic Surgery

## 2019-11-28 ENCOUNTER — Encounter: Payer: Self-pay | Admitting: Orthopaedic Surgery

## 2019-11-28 ENCOUNTER — Other Ambulatory Visit: Payer: Self-pay

## 2019-11-28 ENCOUNTER — Ambulatory Visit (INDEPENDENT_AMBULATORY_CARE_PROVIDER_SITE_OTHER): Payer: BC Managed Care – PPO

## 2019-11-28 ENCOUNTER — Encounter: Payer: Self-pay | Admitting: Rehabilitative and Restorative Service Providers"

## 2019-11-28 ENCOUNTER — Ambulatory Visit (INDEPENDENT_AMBULATORY_CARE_PROVIDER_SITE_OTHER): Payer: BC Managed Care – PPO | Admitting: Rehabilitative and Restorative Service Providers"

## 2019-11-28 DIAGNOSIS — R262 Difficulty in walking, not elsewhere classified: Secondary | ICD-10-CM

## 2019-11-28 DIAGNOSIS — M25562 Pain in left knee: Secondary | ICD-10-CM | POA: Diagnosis not present

## 2019-11-28 DIAGNOSIS — M6281 Muscle weakness (generalized): Secondary | ICD-10-CM

## 2019-11-28 DIAGNOSIS — Z96652 Presence of left artificial knee joint: Secondary | ICD-10-CM | POA: Diagnosis not present

## 2019-11-28 DIAGNOSIS — R600 Localized edema: Secondary | ICD-10-CM

## 2019-11-28 NOTE — Progress Notes (Signed)
   Post-Op Visit Note   Patient: Hailey [name]           Date of Birth: 13-Sep-1961           MRN: [id number] Visit Date: 11/28/2019 PCP: Mliss Sax, MD   Assessment & Plan:  Chief Complaint:  Chief Complaint  Patient presents with  . Left Knee - Follow-up, Routine Post Op   Visit Diagnoses:  1. S/P TKR (total knee replacement), left     Plan: [name] is 6 weeks status post left total knee replacement.  She is doing well overall and improving steadily.  She is doing physical therapy twice a week.  She is not using any ambulatory devices.  She is very happy with her improvement.  Surgical scar is fully healed.  Her range of motion has improved significantly.  She is greater than 95 degrees of flexion.  Stable to varus valgus.  X-rays are unremarkable.  At this point she will continue with physical therapy.  We will continue to hold her out of work to work on strengthening and range of motion.  I anticipate that she will be able to return back to work on April 9.  I would like to recheck her on April 8.  Follow-Up Instructions: Return in about 5 weeks (around 01/04/2020).   Orders:  Orders Placed This Encounter  Procedures  . XR Knee 1-2 Views Left   No orders of the defined types were placed in this encounter.   Imaging: XR Knee 1-2 Views Left  Result Date: 11/28/2019 Stable total knee replacement in good alignment.    PMFS History: Patient Active Problem List   Diagnosis Date Noted  . Status post total left knee replacement 10/16/2019  . Primary osteoarthritis of left knee 09/06/2019  . Health care maintenance 02/24/2019  . Chronic sinusitis 10/04/2018  . CARPAL TUNNEL SYNDROME, BILATERAL 08/04/2010  . GERD 03/06/2008  . Class 2 obesity due to excess calories with body mass index (BMI) of 38.0 to 38.9 in adult 02/10/2007  . Essential hypertension 02/10/2007   Past Medical History:  Diagnosis Date  . Arthritis   . CARPAL TUNNEL SYNDROME, BILATERAL  08/04/2010  . GERD 03/06/2008  . HYPERTENSION 02/10/2007  . Obesity   . OBESITY NOS 02/10/2007    History reviewed. No pertinent family history.  Past Surgical History:  Procedure Laterality Date  . HEMORRHOID SURGERY    . NASAL SINUS SURGERY    . TOTAL KNEE ARTHROPLASTY Left 10/16/2019   Procedure: LEFT TOTAL KNEE ARTHROPLASTY;  Surgeon: Tarry Kos, MD;  Location: [facility];  Service: Orthopedics;  Laterality: Left;   Social History   Occupational History  . Not on file  Tobacco Use  . Smoking status: Never Smoker  . Smokeless tobacco: Never Used  Substance and Sexual Activity  . Alcohol use: Yes    Comment: rarley  . Drug use: No  . Sexual activity: Not on file

## 2019-11-28 NOTE — Therapy (Signed)
[place], [place], [address] Phone: [phone]   Fax:  [phone]  Physical Therapy Treatment  Patient Details  Name: Hailey [name] MRN: [id number] Date of Birth: 07/30/1961 Referring Provider (PT): Erlinda Hong   Encounter Date: 11/28/2019  PT End of Session - 11/28/19 1226    Visit Number  11    Number of Visits  24    Date for PT Re-Evaluation  12/30/19    PT Start Time  7290    PT Stop Time  2111    PT Time Calculation (min)  50 min    Activity Tolerance  Patient tolerated treatment well    Behavior During Therapy  [facility] for tasks assessed/performed       Past Medical History:  Diagnosis Date  . Arthritis   . CARPAL TUNNEL SYNDROME, BILATERAL 08/04/2010  . GERD 03/06/2008  . HYPERTENSION 02/10/2007  . Obesity   . OBESITY NOS 02/10/2007    Past Surgical History:  Procedure Laterality Date  . HEMORRHOID SURGERY    . NASAL SINUS SURGERY    . TOTAL KNEE ARTHROPLASTY Left 10/16/2019   Procedure: LEFT TOTAL KNEE ARTHROPLASTY;  Surgeon: Leandrew Koyanagi, MD;  Location: [place];  Service: Orthopedics;  Laterality: Left;    There were no vitals filed for this visit.  Subjective Assessment - 11/28/19 1223    Subjective  Pt. indicated no specific complaints today.  Had some swelling after time in yard.    Limitations  Standing;Walking    Patient Stated Goals  Indepent walking at PLOF s limitation.  Pt. expressed desire to jog and use elliptical at home.    Currently in Pain?  No/denies    Pain Score  0-No pain    Pain Location  Knee    Pain Orientation  Left    Pain Onset  1 to 4 weeks ago                       [facility] Adult PT Treatment/Exercise - 11/28/19 0001      Neuro Re-ed    Neuro Re-ed Details   SLS on foam 30 sec x 5 bilateral, lateral side stepping 3 cones x 10 bilateral c SL loading      Knee/Hip Exercises: Stretches   Press photographer  Both;5 reps;30 seconds      Knee/Hip Exercises: Aerobic    Stationary Bike  10 mins c occasional full revolution      Knee/Hip Exercises: Plyometrics   Other Plyometric Exercises  SL leg press on plyo 75 lbs 3 x 10      Knee/Hip Exercises: Seated   Long Arc Quad  Strengthening;3 sets;15 reps    Long Arc Quad Weight  9 lbs.      Vasopneumatic   Number Minutes Vasopneumatic   10 minutes    Vasopnuematic Location   Knee    Vasopneumatic Pressure  Medium    Vasopneumatic Temperature   34      Manual Therapy   Muscle Energy Technique  Contract/relax in seated for Lt knee flexion             PT Education - 11/28/19 1224    Education Details  Occasional intervention cues for correction.    Person(s) Educated  Patient    Methods  Explanation;Demonstration;Verbal cues    Comprehension  Verbalized understanding;Returned demonstration       PT Short Term Goals - 11/22/19 1016  PT SHORT TERM GOAL #1   Title  Patient will demonstrate independent use of home exercise program to maintain progress from in clinic treatments.    Baseline  11/22/2019: independent    Time  2    Period  Weeks    Status  Achieved    Target Date  11/17/19        PT Long Term Goals - 11/22/19 1016      PT LONG TERM GOAL #1   Title  Patient will demonstrate/report pain at worst less than or equal to 2/10 to facilitate minimal limitation in daily activity secondary to pain symptoms.    Baseline  11/22/2019 : 5/10 at worst    Time  8    Period  Weeks    Status  Partially Met    Target Date  12/29/19      PT LONG TERM GOAL #2   Title  Patient will demonstrate independent use of home exercise program to facilitate ability to maintain/progress functional gains from skilled physical therapy services.    Baseline  11/22/2019: independent    Time  8    Period  Weeks    Status  Achieved      PT LONG TERM GOAL #3   Title  Patient will demonstrate return to work/recreational activity at previous level of function without limitations secondary due to condition     Time  8    Period  Weeks    Status  Not Met    Target Date  12/29/19      PT LONG TERM GOAL #4   Title  Pt. will demonstrate L knee AROM 0-110 degrees to facilitate usual transfers, walking, stairs at PLOF s limitation.    Baseline  11/22/2019: 0-100    Time  8    Period  Weeks    Status  Partially Met    Target Date  12/29/19      PT LONG TERM GOAL #5   Title  Patient will demonstrate independent ambulation community distances > 300 ft to facilitate community integration at [facility].    Baseline  11/22/2019: independent in clinic on even surfaces, uses cane intermittently in public    Time  8    Period  Weeks    Status  Partially Met    Target Date  12/29/19      PT LONG TERM GOAL #6   Title  Pt. will demonstrate LLE MMT 5/5 throughout to facilitate ability to perform usual work standing, squatting, lifting at PLOF s limitation.    Baseline  11/22/2019: Extension 4+/5    Time  8    Period  Weeks    Status  Partially Met    Target Date  12/29/19            Plan - 11/28/19 1225    Clinical Impression Statement  Continued improvement in WB activity, SLS control on complaint surfaces.  Mild restriction in flexion still noted but improving.  Continued progression c skilled PT services indicated at this time.    Examination-Activity Limitations  Lift;Carry;Squat;Stairs;Stand;Sit;Transfers;Bed Mobility;Bend    [employer] Activity;Driving;Yard Work    Stability/Clinical Decision Making  Stable/Uncomplicated    Rehab Potential  Good    PT Frequency  2x / week    PT Duration  8 weeks    PT Treatment/Interventions  Electrical Stimulation;Cryotherapy;Gait training;Stair training;Functional mobility training;Therapeutic activities;Therapeutic exercise;Balance training;Neuromuscular re-education;Manual techniques;Patient/family education;Passive range of motion;Dry needling;Joint Manipulations;Vasopneumatic Device;Taping    PT Next  Visit Plan   Continue strength, movement coordination improvement plan.    PT Home Exercise Plan  Access Code: [email or web address]    Consulted and Agree with Plan of Care  Patient       Patient will benefit from skilled therapeutic intervention in order to improve the following deficits and impairments:  Abnormal gait, Decreased coordination, Decreased range of motion, Difficulty walking, Decreased endurance, Decreased activity tolerance, Pain, Impaired flexibility, Hypomobility, Decreased balance, Decreased mobility, Decreased strength, Increased edema  Visit Diagnosis: Acute pain of left knee  Muscle weakness (generalized)  Difficulty in walking, not elsewhere classified  Localized edema     Problem List Patient Active Problem List   Diagnosis Date Noted  . Status post total left knee replacement 10/16/2019  . Primary osteoarthritis of left knee 09/06/2019  . Health care maintenance 02/24/2019  . Chronic sinusitis 10/04/2018  . CARPAL TUNNEL SYNDROME, BILATERAL 08/04/2010  . GERD 03/06/2008  . Class 2 obesity due to excess calories with body mass index (BMI) of 38.0 to 38.9 in adult 02/10/2007  . Essential hypertension 02/10/2007   Scot Jun, PT, DPT, OCS, ATC 11/28/19  12:31 PM    [facility] [address] [place], [place], [address] Phone: [phone]   Fax:  [phone]  Name: Hailey [name] MRN: [id number] Date of Birth: 13-Nov-1960

## 2019-11-30 ENCOUNTER — Encounter: Payer: Self-pay | Admitting: Rehabilitative and Restorative Service Providers"

## 2019-11-30 ENCOUNTER — Ambulatory Visit (INDEPENDENT_AMBULATORY_CARE_PROVIDER_SITE_OTHER): Payer: BC Managed Care – PPO | Admitting: Rehabilitative and Restorative Service Providers"

## 2019-11-30 ENCOUNTER — Other Ambulatory Visit: Payer: Self-pay

## 2019-11-30 DIAGNOSIS — M25562 Pain in left knee: Secondary | ICD-10-CM | POA: Diagnosis not present

## 2019-11-30 DIAGNOSIS — M6281 Muscle weakness (generalized): Secondary | ICD-10-CM | POA: Diagnosis not present

## 2019-11-30 DIAGNOSIS — R262 Difficulty in walking, not elsewhere classified: Secondary | ICD-10-CM

## 2019-11-30 DIAGNOSIS — R600 Localized edema: Secondary | ICD-10-CM | POA: Diagnosis not present

## 2019-11-30 NOTE — Therapy (Signed)
[place], [place], [address] Phone: [phone]   Fax:  [phone]  Physical Therapy Treatment  Patient Details  Name: CHARMION HAPKE MRN: [id number] Date of Birth: 07-25-1961 Referring Provider (PT): Erlinda Hong   Encounter Date: 11/30/2019  PT End of Session - 11/30/19 1014    Visit Number  12    Number of Visits  24    Date for PT Re-Evaluation  12/30/19    PT Start Time  0930    PT Stop Time  1020    PT Time Calculation (min)  50 min    Activity Tolerance  Patient tolerated treatment well    Behavior During Therapy  [facility] for tasks assessed/performed       Past Medical History:  Diagnosis Date  . Arthritis   . CARPAL TUNNEL SYNDROME, BILATERAL 08/04/2010  . GERD 03/06/2008  . HYPERTENSION 02/10/2007  . Obesity   . OBESITY NOS 02/10/2007    Past Surgical History:  Procedure Laterality Date  . HEMORRHOID SURGERY    . NASAL SINUS SURGERY    . TOTAL KNEE ARTHROPLASTY Left 10/16/2019   Procedure: LEFT TOTAL KNEE ARTHROPLASTY;  Surgeon: Leandrew Koyanagi, MD;  Location: [place];  Service: Orthopedics;  Laterality: Left;    There were no vitals filed for this visit.  Subjective Assessment - 11/30/19 0945    Subjective  Pt. stated feeling pretty good.    Limitations  Standing;Walking    Patient Stated Goals  Indepent walking at PLOF s limitation.  Pt. expressed desire to jog and use elliptical at home.    Currently in Pain?  No/denies    Pain Score  0-No pain    Pain Onset  1 to 4 weeks ago                       [facility] Adult PT Treatment/Exercise - 11/30/19 0001      Neuro Re-ed    Neuro Re-ed Details   SLS c contralateral step over return 6 inch 20x bilateral, lateral stepping 3 cones x 10 bilateral c SL loading      Knee/Hip Exercises: Stretches   Press photographer  Both;5 reps;30 seconds      Knee/Hip Exercises: Aerobic   Stationary Bike  L2 10 mins      Knee/Hip Exercises: Plyometrics   Other  Plyometric Exercises  SL leg press on plyo 75 lbs 3 x 15      Knee/Hip Exercises: Standing   Lateral Step Up  Both;3 sets;10 reps;Step Height: 6"    Functional Squat  3 sets;10 reps   12 lb weight     Vasopneumatic   Number Minutes Vasopneumatic   10 minutes    Vasopnuematic Location   Knee    Vasopneumatic Pressure  Medium    Vasopneumatic Temperature   34             PT Education - 11/30/19 0949    Education Details  Cues for intervention at times.    Person(s) Educated  Patient    Methods  Explanation;Demonstration    Comprehension  Verbalized understanding;Returned demonstration       PT Short Term Goals - 11/22/19 1016      PT SHORT TERM GOAL #1   Title  Patient will demonstrate independent use of home exercise program to maintain progress from in clinic treatments.    Baseline  11/22/2019: independent    Time  2  Period  Weeks    Status  Achieved    Target Date  11/17/19        PT Long Term Goals - 11/22/19 1016      PT LONG TERM GOAL #1   Title  Patient will demonstrate/report pain at worst less than or equal to 2/10 to facilitate minimal limitation in daily activity secondary to pain symptoms.    Baseline  11/22/2019 : 5/10 at worst    Time  8    Period  Weeks    Status  Partially Met    Target Date  12/29/19      PT LONG TERM GOAL #2   Title  Patient will demonstrate independent use of home exercise program to facilitate ability to maintain/progress functional gains from skilled physical therapy services.    Baseline  11/22/2019: independent    Time  8    Period  Weeks    Status  Achieved      PT LONG TERM GOAL #3   Title  Patient will demonstrate return to work/recreational activity at previous level of function without limitations secondary due to condition    Time  8    Period  Weeks    Status  Not Met    Target Date  12/29/19      PT LONG TERM GOAL #4   Title  Pt. will demonstrate L knee AROM 0-110 degrees to facilitate usual transfers,  walking, stairs at PLOF s limitation.    Baseline  11/22/2019: 0-100    Time  8    Period  Weeks    Status  Partially Met    Target Date  12/29/19      PT LONG TERM GOAL #5   Title  Patient will demonstrate independent ambulation community distances > 300 ft to facilitate community integration at [facility].    Baseline  11/22/2019: independent in clinic on even surfaces, uses cane intermittently in public    Time  8    Period  Weeks    Status  Partially Met    Target Date  12/29/19      PT LONG TERM GOAL #6   Title  Pt. will demonstrate LLE MMT 5/5 throughout to facilitate ability to perform usual work standing, squatting, lifting at PLOF s limitation.    Baseline  11/22/2019: Extension 4+/5    Time  8    Period  Weeks    Status  Partially Met    Target Date  12/29/19            Plan - 11/30/19 1012    Clinical Impression Statement  Pt. showing progression towards return to community ambulation independent at this time c minimal deviations, as well as progressing into resisted training in WB including functional squats.  Pt. to benefit from continued skilled PT services to progress towards PLOF in recreational workouts.    Examination-Activity Limitations  Lift;Carry;Squat;Stairs;Stand;Sit;Transfers;Bed Mobility;Bend    [employer] Activity;Driving;Yard Work    Stability/Clinical Decision Making  Stable/Uncomplicated    Rehab Potential  Good    PT Frequency  2x / week    PT Duration  8 weeks    PT Treatment/Interventions  Electrical Stimulation;Cryotherapy;Gait training;Stair training;Functional mobility training;Therapeutic activities;Therapeutic exercise;Balance training;Neuromuscular re-education;Manual techniques;Patient/family education;Passive range of motion;Dry needling;Joint Manipulations;Vasopneumatic Device;Taping    PT Next Visit Plan  Progress end range flexion in active mobility.    PT Home Exercise Plan  Access Code: [email or web address]     Consulted and Agree  with Plan of Care  Patient       Patient will benefit from skilled therapeutic intervention in order to improve the following deficits and impairments:  Abnormal gait, Decreased coordination, Decreased range of motion, Difficulty walking, Decreased endurance, Decreased activity tolerance, Pain, Impaired flexibility, Hypomobility, Decreased balance, Decreased mobility, Decreased strength, Increased edema  Visit Diagnosis: Acute pain of left knee  Muscle weakness (generalized)  Difficulty in walking, not elsewhere classified  Localized edema     Problem List Patient Active Problem List   Diagnosis Date Noted  . Status post total left knee replacement 10/16/2019  . Primary osteoarthritis of left knee 09/06/2019  . Health care maintenance 02/24/2019  . Chronic sinusitis 10/04/2018  . CARPAL TUNNEL SYNDROME, BILATERAL 08/04/2010  . GERD 03/06/2008  . Class 2 obesity due to excess calories with body mass index (BMI) of 38.0 to 38.9 in adult 02/10/2007  . Essential hypertension 02/10/2007    Scot Jun, PT, DPT, OCS, ATC 11/30/19  10:15 AM    [facility] [address] [place], [place], [address] Phone: [phone]   Fax:  [phone]  Name: MIRRANDA MONRROY MRN: [id number] Date of Birth: 08-30-1961

## 2019-12-02 ENCOUNTER — Ambulatory Visit: Payer: BC Managed Care – PPO | Attending: Internal Medicine

## 2019-12-02 DIAGNOSIS — Z23 Encounter for immunization: Secondary | ICD-10-CM | POA: Insufficient documentation

## 2019-12-02 NOTE — Progress Notes (Signed)
   Covid-19 Vaccination Clinic  Name:  Hailey [name]    MRN: [id number] DOB: Jan 31, 1961  12/02/2019  [name] was observed post Covid-19 immunization for 15 minutes without incident. She was provided with Vaccine Information Sheet and instruction to access the V-Safe system.   [name] was instructed to call 911 with any severe reactions post vaccine: Marland Kitchen Difficulty breathing  . Swelling of face and throat  . A fast heartbeat  . A bad rash all over body  . Dizziness and weakness   Immunizations Administered    Name Date Dose VIS Date Route   Pfizer COVID-19 Vaccine 12/02/2019  1:21 PM 0.3 mL 09/08/2019 Intramuscular   Manufacturer: [employer]   Lot: [id number]   NDC: [address]-3

## 2019-12-05 ENCOUNTER — Encounter: Payer: Self-pay | Admitting: Rehabilitative and Restorative Service Providers"

## 2019-12-05 ENCOUNTER — Ambulatory Visit (INDEPENDENT_AMBULATORY_CARE_PROVIDER_SITE_OTHER): Payer: BC Managed Care – PPO | Admitting: Rehabilitative and Restorative Service Providers"

## 2019-12-05 ENCOUNTER — Other Ambulatory Visit: Payer: Self-pay

## 2019-12-05 DIAGNOSIS — M25562 Pain in left knee: Secondary | ICD-10-CM | POA: Diagnosis not present

## 2019-12-05 DIAGNOSIS — R600 Localized edema: Secondary | ICD-10-CM | POA: Diagnosis not present

## 2019-12-05 DIAGNOSIS — M6281 Muscle weakness (generalized): Secondary | ICD-10-CM | POA: Diagnosis not present

## 2019-12-05 DIAGNOSIS — R262 Difficulty in walking, not elsewhere classified: Secondary | ICD-10-CM | POA: Diagnosis not present

## 2019-12-05 NOTE — Therapy (Signed)
[place], [place], [address] Phone: [phone]   Fax:  [phone]  Physical Therapy Treatment  Patient Details  Name: Hailey [name] MRN: [id number] Date of Birth: [birth year] Referring Provider (PT): Purcell Mouton Date: 12/05/2019  PT End of Session - 12/05/19 1008    Visit Number  13    Number of Visits  24    Date for PT Re-Evaluation  12/30/19    PT Start Time  0934    PT Stop Time  1025    PT Time Calculation (min)  51 min    Activity Tolerance  Patient tolerated treatment well    Behavior During Therapy  [facility] for tasks assessed/performed       Past Medical History:  Diagnosis Date  . Arthritis   . CARPAL TUNNEL SYNDROME, BILATERAL 08/04/2010  . GERD 03/06/2008  . HYPERTENSION 02/10/2007  . Obesity   . OBESITY NOS 02/10/2007    Past Surgical History:  Procedure Laterality Date  . HEMORRHOID SURGERY    . NASAL SINUS SURGERY    . TOTAL KNEE ARTHROPLASTY Left 10/16/2019   Procedure: LEFT TOTAL KNEE ARTHROPLASTY;  Surgeon: Leandrew Koyanagi, MD;  Location: [place];  Service: Orthopedics;  Laterality: Left;    There were no vitals filed for this visit.  Subjective Assessment - 12/05/19 0940    Subjective  Pt. stated some swelling down in leg, getting easier to get stocking on.  Drove today in low car.    Limitations  Standing;Walking    Patient Stated Goals  Indepent walking at PLOF s limitation.  Pt. expressed desire to jog and use elliptical at home.    Currently in Pain?  No/denies    Pain Score  0-No pain    Pain Location  Knee    Pain Orientation  Left    Pain Onset  1 to 4 weeks ago                       [facility] Adult PT Treatment/Exercise - 12/05/19 0001      Neuro Re-ed    Neuro Re-ed Details   star excursion 5 cones x 10 bilateral, SLS c contralateral step over return 6 inch 20x bilateral, lateral stepping 3 cones x 10 bilateral c SL loading      Knee/Hip Exercises: Stretches   Press photographer  Both;5 reps;30 seconds      Knee/Hip Exercises: Aerobic   Stationary Bike  L2 10 mins      Knee/Hip Exercises: Plyometrics   Other Plyometric Exercises  SL leg press on plyo 75 lbs 3 x 15      Knee/Hip Exercises: Standing   Functional Squat  3 sets;10 reps   15 lbs     Vasopneumatic   Number Minutes Vasopneumatic   10 minutes    Vasopnuematic Location   Knee    Vasopneumatic Pressure  Medium    Vasopneumatic Temperature   34             PT Education - 12/05/19 0941    Education Details  Cues at times for intervention.    Person(s) Educated  Patient    Methods  Explanation;Demonstration    Comprehension  Verbalized understanding;Returned demonstration       PT Short Term Goals - 11/22/19 1016      PT SHORT TERM GOAL #1   Title  Patient will demonstrate independent use of home exercise program to maintain  progress from in clinic treatments.    Baseline  11/22/2019: independent    Time  2    Period  Weeks    Status  Achieved    Target Date  11/17/19        PT Long Term Goals - 11/22/19 1016      PT LONG TERM GOAL #1   Title  Patient will demonstrate/report pain at worst less than or equal to 2/10 to facilitate minimal limitation in daily activity secondary to pain symptoms.    Baseline  11/22/2019 : 5/10 at worst    Time  8    Period  Weeks    Status  Partially Met    Target Date  12/29/19      PT LONG TERM GOAL #2   Title  Patient will demonstrate independent use of home exercise program to facilitate ability to maintain/progress functional gains from skilled physical therapy services.    Baseline  11/22/2019: independent    Time  8    Period  Weeks    Status  Achieved      PT LONG TERM GOAL #3   Title  Patient will demonstrate return to work/recreational activity at previous level of function without limitations secondary due to condition    Time  8    Period  Weeks    Status  Not Met    Target Date  12/29/19      PT LONG TERM GOAL  #4   Title  Pt. will demonstrate L knee AROM 0-110 degrees to facilitate usual transfers, walking, stairs at PLOF s limitation.    Baseline  11/22/2019: 0-100    Time  8    Period  Weeks    Status  Partially Met    Target Date  12/29/19      PT LONG TERM GOAL #5   Title  Patient will demonstrate independent ambulation community distances > 300 ft to facilitate community integration at [facility].    Baseline  11/22/2019: independent in clinic on even surfaces, uses cane intermittently in public    Time  8    Period  Weeks    Status  Partially Met    Target Date  12/29/19      PT LONG TERM GOAL #6   Title  Pt. will demonstrate LLE MMT 5/5 throughout to facilitate ability to perform usual work standing, squatting, lifting at PLOF s limitation.    Baseline  11/22/2019: Extension 4+/5    Time  8    Period  Weeks    Status  Partially Met    Target Date  12/29/19            Plan - 12/05/19 1007    Clinical Impression Statement  Pt. continues to demonstrate improvement in quality of all movements, as well as stability in dynamic movements.  Continued skilled PT services paired c HEP indicated at this time.    Examination-Activity Limitations  Lift;Carry;Squat;Stairs;Stand;Sit;Transfers;Bed Mobility;Bend    [employer] Activity;Driving;Yard Work    Stability/Clinical Decision Making  Stable/Uncomplicated    Rehab Potential  Good    PT Frequency  2x / week    PT Duration  8 weeks    PT Treatment/Interventions  Electrical Stimulation;Cryotherapy;Gait training;Stair training;Functional mobility training;Therapeutic activities;Therapeutic exercise;Balance training;Neuromuscular re-education;Manual techniques;Patient/family education;Passive range of motion;Dry needling;Joint Manipulations;Vasopneumatic Device;Taping    PT Next Visit Plan  Continued strengthening and dynamic stability.    PT Home Exercise Plan  Access Code: [email or web address]  Consulted and Agree  with Plan of Care  Patient       Patient will benefit from skilled therapeutic intervention in order to improve the following deficits and impairments:  Abnormal gait, Decreased coordination, Decreased range of motion, Difficulty walking, Decreased endurance, Decreased activity tolerance, Pain, Impaired flexibility, Hypomobility, Decreased balance, Decreased mobility, Decreased strength, Increased edema  Visit Diagnosis: Acute pain of left knee  Muscle weakness (generalized)  Difficulty in walking, not elsewhere classified  Localized edema     Problem List Patient Active Problem List   Diagnosis Date Noted  . Status post total left knee replacement 10/16/2019  . Primary osteoarthritis of left knee 09/06/2019  . Health care maintenance 02/24/2019  . Chronic sinusitis 10/04/2018  . CARPAL TUNNEL SYNDROME, BILATERAL 08/04/2010  . GERD 03/06/2008  . Class 2 obesity due to excess calories with body mass index (BMI) of 38.0 to 38.9 in adult 02/10/2007  . Essential hypertension 02/10/2007   Scot Jun, PT, DPT, OCS, ATC 12/05/19  10:10 AM    [facility] [address] [place], [place], [address] Phone: [phone]   Fax:  [phone]  Name: Hailey [name] MRN: [id number] Date of Birth: 1961/05/20

## 2019-12-08 ENCOUNTER — Ambulatory Visit (INDEPENDENT_AMBULATORY_CARE_PROVIDER_SITE_OTHER): Payer: BC Managed Care – PPO | Admitting: Rehabilitative and Restorative Service Providers"

## 2019-12-08 ENCOUNTER — Encounter: Payer: Self-pay | Admitting: Rehabilitative and Restorative Service Providers"

## 2019-12-08 ENCOUNTER — Other Ambulatory Visit: Payer: Self-pay

## 2019-12-08 DIAGNOSIS — M25562 Pain in left knee: Secondary | ICD-10-CM | POA: Diagnosis not present

## 2019-12-08 DIAGNOSIS — R600 Localized edema: Secondary | ICD-10-CM | POA: Diagnosis not present

## 2019-12-08 DIAGNOSIS — M6281 Muscle weakness (generalized): Secondary | ICD-10-CM | POA: Diagnosis not present

## 2019-12-08 DIAGNOSIS — R262 Difficulty in walking, not elsewhere classified: Secondary | ICD-10-CM | POA: Diagnosis not present

## 2019-12-08 NOTE — Therapy (Signed)
[place], [place], [address] Phone: [phone]   Fax:  [phone]  Physical Therapy Treatment  Patient Details  Name: Hailey [name] MRN: [id number] Date of Birth: 1961-06-22 Referring Provider (PT): Erlinda Hong   Encounter Date: 12/08/2019  PT End of Session - 12/08/19 1015    Visit Number  14    Number of Visits  24    Date for PT Re-Evaluation  12/30/19    PT Start Time  0931    PT Stop Time  1022    PT Time Calculation (min)  51 min    Activity Tolerance  Patient tolerated treatment well    Behavior During Therapy  [facility] for tasks assessed/performed       Past Medical History:  Diagnosis Date  . Arthritis   . CARPAL TUNNEL SYNDROME, BILATERAL 08/04/2010  . GERD 03/06/2008  . HYPERTENSION 02/10/2007  . Obesity   . OBESITY NOS 02/10/2007    Past Surgical History:  Procedure Laterality Date  . HEMORRHOID SURGERY    . NASAL SINUS SURGERY    . TOTAL KNEE ARTHROPLASTY Left 10/16/2019   Procedure: LEFT TOTAL KNEE ARTHROPLASTY;  Surgeon: Leandrew Koyanagi, MD;  Location: [place];  Service: Orthopedics;  Laterality: Left;    There were no vitals filed for this visit.  Subjective Assessment - 12/08/19 0941    Subjective  Pt. stated some soreness after shoveling but overall doing ok.  No pain upon arrival at rest.    Limitations  Standing;Walking    Patient Stated Goals  Indepent walking at PLOF s limitation.  Pt. expressed desire to jog and use elliptical at home.    Currently in Pain?  No/denies    Pain Score  0-No pain    Pain Location  Knee    Pain Orientation  Left    Pain Onset  1 to 4 weeks ago                       [facility] Adult PT Treatment/Exercise - 12/08/19 0001      Neuro Re-ed    Neuro Re-ed Details   SLS on foam 30 sec x 5 bilaterally, star excursion 5 cones x 10 bilateral, SLS c contralateral step over return 8 inch 20x bilateral      Knee/Hip Exercises: Stretches   Gastroc Stretch  Both;5  reps;30 seconds      Knee/Hip Exercises: Aerobic   Stationary Bike  L2 10 mins      Knee/Hip Exercises: Standing   Forward Step Up  Both;2 sets;10 reps;Step Height: 6"   c band TKE hold   Functional Squat  3 sets;10 reps   15 lbs     Vasopneumatic   Number Minutes Vasopneumatic   10 minutes    Vasopnuematic Location   Knee    Vasopneumatic Pressure  Medium    Vasopneumatic Temperature   34             PT Education - 12/08/19 1011    Education Details  Intervention cues occasionally.    Person(s) Educated  Patient    Methods  Explanation;Demonstration;Verbal cues    Comprehension  Verbalized understanding;Returned demonstration       PT Short Term Goals - 11/22/19 1016      PT SHORT TERM GOAL #1   Title  Patient will demonstrate independent use of home exercise program to maintain progress from in clinic treatments.    Baseline  11/22/2019:  independent    Time  2    Period  Weeks    Status  Achieved    Target Date  11/17/19        PT Long Term Goals - 11/22/19 1016      PT LONG TERM GOAL #1   Title  Patient will demonstrate/report pain at worst less than or equal to 2/10 to facilitate minimal limitation in daily activity secondary to pain symptoms.    Baseline  11/22/2019 : 5/10 at worst    Time  8    Period  Weeks    Status  Partially Met    Target Date  12/29/19      PT LONG TERM GOAL #2   Title  Patient will demonstrate independent use of home exercise program to facilitate ability to maintain/progress functional gains from skilled physical therapy services.    Baseline  11/22/2019: independent    Time  8    Period  Weeks    Status  Achieved      PT LONG TERM GOAL #3   Title  Patient will demonstrate return to work/recreational activity at previous level of function without limitations secondary due to condition    Time  8    Period  Weeks    Status  Not Met    Target Date  12/29/19      PT LONG TERM GOAL #4   Title  Pt. will demonstrate L knee  AROM 0-110 degrees to facilitate usual transfers, walking, stairs at PLOF s limitation.    Baseline  11/22/2019: 0-100    Time  8    Period  Weeks    Status  Partially Met    Target Date  12/29/19      PT LONG TERM GOAL #5   Title  Patient will demonstrate independent ambulation community distances > 300 ft to facilitate community integration at [facility].    Baseline  11/22/2019: independent in clinic on even surfaces, uses cane intermittently in public    Time  8    Period  Weeks    Status  Partially Met    Target Date  12/29/19      PT LONG TERM GOAL #6   Title  Pt. will demonstrate LLE MMT 5/5 throughout to facilitate ability to perform usual work standing, squatting, lifting at PLOF s limitation.    Baseline  11/22/2019: Extension 4+/5    Time  8    Period  Weeks    Status  Partially Met    Target Date  12/29/19            Plan - 12/08/19 1011    Clinical Impression Statement  Complaint surface balance control to be improved more to improve uneven surface independent ambulation in community.  Making gains in functional mobility including squats/stairs.  Transitioning to reduced frequnecy indicated.    Examination-Activity Limitations  Lift;Carry;Squat;Stairs;Stand;Sit;Transfers;Bed Mobility;Bend    [employer] Activity;Driving;Yard Work    Stability/Clinical Decision Making  Stable/Uncomplicated    Rehab Potential  Good    PT Frequency  2x / week    PT Duration  8 weeks    PT Treatment/Interventions  Electrical Stimulation;Cryotherapy;Gait training;Stair training;Functional mobility training;Therapeutic activities;Therapeutic exercise;Balance training;Neuromuscular re-education;Manual techniques;Patient/family education;Passive range of motion;Dry needling;Joint Manipulations;Vasopneumatic Device;Taping    PT Next Visit Plan  Continued strengthening and dynamic stability.    PT Home Exercise Plan  Access Code: [email or web address]    Consulted and Agree  with Plan of Care  Patient       Patient will benefit from skilled therapeutic intervention in order to improve the following deficits and impairments:  Abnormal gait, Decreased coordination, Decreased range of motion, Difficulty walking, Decreased endurance, Decreased activity tolerance, Pain, Impaired flexibility, Hypomobility, Decreased balance, Decreased mobility, Decreased strength, Increased edema  Visit Diagnosis: Acute pain of left knee  Muscle weakness (generalized)  Difficulty in walking, not elsewhere classified  Localized edema     Problem List Patient Active Problem List   Diagnosis Date Noted  . Status post total left knee replacement 10/16/2019  . Primary osteoarthritis of left knee 09/06/2019  . Health care maintenance 02/24/2019  . Chronic sinusitis 10/04/2018  . CARPAL TUNNEL SYNDROME, BILATERAL 08/04/2010  . GERD 03/06/2008  . Class 2 obesity due to excess calories with body mass index (BMI) of 38.0 to 38.9 in adult 02/10/2007  . Essential hypertension 02/10/2007    Scot Jun, PT, DPT, OCS, ATC 12/08/19  10:16 AM    [facility] Physical Therapy [address] [place], [place], [address] Phone: [phone]   Fax:  [phone]  Name: AVIELA BLUNDELL MRN: [id number] Date of Birth: Nov 20, 1960

## 2019-12-11 ENCOUNTER — Other Ambulatory Visit: Payer: Self-pay

## 2019-12-11 ENCOUNTER — Ambulatory Visit (INDEPENDENT_AMBULATORY_CARE_PROVIDER_SITE_OTHER): Payer: BC Managed Care – PPO | Admitting: Rehabilitative and Restorative Service Providers"

## 2019-12-11 ENCOUNTER — Encounter: Payer: Self-pay | Admitting: Rehabilitative and Restorative Service Providers"

## 2019-12-11 DIAGNOSIS — M25562 Pain in left knee: Secondary | ICD-10-CM

## 2019-12-11 DIAGNOSIS — M6281 Muscle weakness (generalized): Secondary | ICD-10-CM

## 2019-12-11 DIAGNOSIS — R600 Localized edema: Secondary | ICD-10-CM

## 2019-12-11 DIAGNOSIS — R262 Difficulty in walking, not elsewhere classified: Secondary | ICD-10-CM | POA: Diagnosis not present

## 2019-12-11 NOTE — Therapy (Signed)
[place], [place], [address] Phone: [phone]   Fax:  [phone]  Physical Therapy Treatment  Patient Details  Name: Hailey [name] MRN: [id number] Date of Birth: 1961/06/07 Referring Provider (PT): Erlinda Hong   Encounter Date: 12/11/2019  PT End of Session - 12/11/19 0955    Visit Number  15    Number of Visits  24    Date for PT Re-Evaluation  12/30/19    PT Start Time  0932    PT Stop Time  1025    PT Time Calculation (min)  53 min    Activity Tolerance  Patient tolerated treatment well    Behavior During Therapy  [facility] for tasks assessed/performed       Past Medical History:  Diagnosis Date  . Arthritis   . CARPAL TUNNEL SYNDROME, BILATERAL 08/04/2010  . GERD 03/06/2008  . HYPERTENSION 02/10/2007  . Obesity   . OBESITY NOS 02/10/2007    Past Surgical History:  Procedure Laterality Date  . HEMORRHOID SURGERY    . NASAL SINUS SURGERY    . TOTAL KNEE ARTHROPLASTY Left 10/16/2019   Procedure: LEFT TOTAL KNEE ARTHROPLASTY;  Surgeon: Leandrew Koyanagi, MD;  Location: [place];  Service: Orthopedics;  Laterality: Left;    There were no vitals filed for this visit.  Subjective Assessment - 12/11/19 0952    Subjective  Pt. indicated some increased welling after lots of walking/standing over the weekend.  Feels tight because of it.  Mild pain.    Limitations  Standing;Walking    Patient Stated Goals  Indepent walking at PLOF s limitation.  Pt. expressed desire to jog and use elliptical at home.    Currently in Pain?  Yes    Pain Location  Knee    Pain Orientation  Left    Pain Descriptors / Indicators  Tightness    Pain Onset  1 to 4 weeks ago                       [facility] Adult PT Treatment/Exercise - 12/11/19 0001      Neuro Re-ed    Neuro Re-ed Details   SLS c 7 inch hurdle clear over/back 20 x each way, SLS c anterior/lateral, anterior/medial command touches 30 sec x 4 bilaterally      Knee/Hip  Exercises: Stretches   Gastroc Stretch  Both;5 reps;30 seconds      Knee/Hip Exercises: Aerobic   Tread Mill  Self selected walking pace 10 mins      Knee/Hip Exercises: Plyometrics   Other Plyometric Exercises  SL leg press on plyo 75 lbs 3 x 10      Knee/Hip Exercises: Standing   Forward Step Up  Both;10 reps;Step Height: 6";3 sets   c TKE band     Knee/Hip Exercises: Seated   Hamstring Curl  Strengthening;3 sets;10 reps   blue            PT Education - 12/11/19 0953    Education Details  Cues occasionally for technique.    Person(s) Educated  Patient    Methods  Explanation;Demonstration    Comprehension  Verbalized understanding;Returned demonstration       PT Short Term Goals - 11/22/19 1016      PT SHORT TERM GOAL #1   Title  Patient will demonstrate independent use of home exercise program to maintain progress from in clinic treatments.    Baseline  11/22/2019: independent  Time  2    Period  Weeks    Status  Achieved    Target Date  11/17/19        PT Long Term Goals - 11/22/19 1016      PT LONG TERM GOAL #1   Title  Patient will demonstrate/report pain at worst less than or equal to 2/10 to facilitate minimal limitation in daily activity secondary to pain symptoms.    Baseline  11/22/2019 : 5/10 at worst    Time  8    Period  Weeks    Status  Partially Met    Target Date  12/29/19      PT LONG TERM GOAL #2   Title  Patient will demonstrate independent use of home exercise program to facilitate ability to maintain/progress functional gains from skilled physical therapy services.    Baseline  11/22/2019: independent    Time  8    Period  Weeks    Status  Achieved      PT LONG TERM GOAL #3   Title  Patient will demonstrate return to work/recreational activity at previous level of function without limitations secondary due to condition    Time  8    Period  Weeks    Status  Not Met    Target Date  12/29/19      PT LONG TERM GOAL #4   Title   Pt. will demonstrate L knee AROM 0-110 degrees to facilitate usual transfers, walking, stairs at PLOF s limitation.    Baseline  11/22/2019: 0-100    Time  8    Period  Weeks    Status  Partially Met    Target Date  12/29/19      PT LONG TERM GOAL #5   Title  Patient will demonstrate independent ambulation community distances > 300 ft to facilitate community integration at [facility].    Baseline  11/22/2019: independent in clinic on even surfaces, uses cane intermittently in public    Time  8    Period  Weeks    Status  Partially Met    Target Date  12/29/19      PT LONG TERM GOAL #6   Title  Pt. will demonstrate LLE MMT 5/5 throughout to facilitate ability to perform usual work standing, squatting, lifting at PLOF s limitation.    Baseline  11/22/2019: Extension 4+/5    Time  8    Period  Weeks    Status  Partially Met    Target Date  12/29/19            Plan - 12/11/19 0954    Clinical Impression Statement  Mild increase in flexion resistance today upon arrival, but improved c intervention.  Mild antalgic gait c reduced stance on invovled LE but improved c movement in clinic.    Examination-Activity Limitations  Lift;Carry;Squat;Stairs;Stand;Sit;Transfers;Bed Mobility;Bend    [employer] Activity;Driving;Yard Work    Stability/Clinical Decision Making  Stable/Uncomplicated    Rehab Potential  Good    PT Frequency  2x / week    PT Duration  8 weeks    PT Treatment/Interventions  Electrical Stimulation;Cryotherapy;Gait training;Stair training;Functional mobility training;Therapeutic activities;Therapeutic exercise;Balance training;Neuromuscular re-education;Manual techniques;Patient/family education;Passive range of motion;Dry needling;Joint Manipulations;Vasopneumatic Device;Taping    PT Next Visit Plan  Continue to work on dynamic stability, end range flexion in active movements.    PT Home Exercise Plan  Access Code: [email or web address]    Consulted and  Agree with Plan of Care  Patient       Patient will benefit from skilled therapeutic intervention in order to improve the following deficits and impairments:  Abnormal gait, Decreased coordination, Decreased range of motion, Difficulty walking, Decreased endurance, Decreased activity tolerance, Pain, Impaired flexibility, Hypomobility, Decreased balance, Decreased mobility, Decreased strength, Increased edema  Visit Diagnosis: Acute pain of left knee  Muscle weakness (generalized)  Difficulty in walking, not elsewhere classified  Localized edema     Problem List Patient Active Problem List   Diagnosis Date Noted  . Status post total left knee replacement 10/16/2019  . Primary osteoarthritis of left knee 09/06/2019  . Health care maintenance 02/24/2019  . Chronic sinusitis 10/04/2018  . CARPAL TUNNEL SYNDROME, BILATERAL 08/04/2010  . GERD 03/06/2008  . Class 2 obesity due to excess calories with body mass index (BMI) of 38.0 to 38.9 in adult 02/10/2007  . Essential hypertension 02/10/2007   Scot Jun, PT, DPT, OCS, ATC 12/11/19  10:15 AM    [facility] Physical Therapy [address] [place], [place], [address] Phone: [phone]   Fax:  [phone]  Name: Hailey [name] MRN: [id number] Date of Birth: 06-05-1961

## 2019-12-13 ENCOUNTER — Ambulatory Visit (INDEPENDENT_AMBULATORY_CARE_PROVIDER_SITE_OTHER): Payer: BC Managed Care – PPO | Admitting: Rehabilitative and Restorative Service Providers"

## 2019-12-13 ENCOUNTER — Other Ambulatory Visit: Payer: Self-pay

## 2019-12-13 ENCOUNTER — Ambulatory Visit (INDEPENDENT_AMBULATORY_CARE_PROVIDER_SITE_OTHER): Payer: BC Managed Care – PPO | Admitting: Podiatry

## 2019-12-13 ENCOUNTER — Encounter: Payer: Self-pay | Admitting: Rehabilitative and Restorative Service Providers"

## 2019-12-13 DIAGNOSIS — B351 Tinea unguium: Secondary | ICD-10-CM

## 2019-12-13 DIAGNOSIS — M79674 Pain in right toe(s): Secondary | ICD-10-CM

## 2019-12-13 DIAGNOSIS — M2141 Flat foot [pes planus] (acquired), right foot: Secondary | ICD-10-CM | POA: Diagnosis not present

## 2019-12-13 DIAGNOSIS — R262 Difficulty in walking, not elsewhere classified: Secondary | ICD-10-CM | POA: Diagnosis not present

## 2019-12-13 DIAGNOSIS — M79675 Pain in left toe(s): Secondary | ICD-10-CM

## 2019-12-13 DIAGNOSIS — R600 Localized edema: Secondary | ICD-10-CM | POA: Diagnosis not present

## 2019-12-13 DIAGNOSIS — M25562 Pain in left knee: Secondary | ICD-10-CM | POA: Diagnosis not present

## 2019-12-13 DIAGNOSIS — M2142 Flat foot [pes planus] (acquired), left foot: Secondary | ICD-10-CM | POA: Diagnosis not present

## 2019-12-13 DIAGNOSIS — M6281 Muscle weakness (generalized): Secondary | ICD-10-CM

## 2019-12-13 NOTE — Therapy (Signed)
[place], [place], [address] Phone: [phone]   Fax:  [phone]  Physical Therapy Treatment  Patient Details  Name: Hailey [name] MRN: [id number] Date of Birth: [birth year] Referring Provider (PT): Erlinda Hong   Encounter Date: 12/13/2019  PT End of Session - 12/13/19 1040    Visit Number  16    Number of Visits  24    Date for PT Re-Evaluation  12/30/19    PT Start Time  3903    PT Stop Time  1105    PT Time Calculation (min)  50 min    Activity Tolerance  Patient tolerated treatment well    Behavior During Therapy  [facility] for tasks assessed/performed       Past Medical History:  Diagnosis Date  . Arthritis   . CARPAL TUNNEL SYNDROME, BILATERAL 08/04/2010  . GERD 03/06/2008  . HYPERTENSION 02/10/2007  . Obesity   . OBESITY NOS 02/10/2007    Past Surgical History:  Procedure Laterality Date  . HEMORRHOID SURGERY    . NASAL SINUS SURGERY    . TOTAL KNEE ARTHROPLASTY Left 10/16/2019   Procedure: LEFT TOTAL KNEE ARTHROPLASTY;  Surgeon: Leandrew Koyanagi, MD;  Location: [place];  Service: Orthopedics;  Laterality: Left;    There were no vitals filed for this visit.  Subjective Assessment - 12/13/19 1027    Subjective  Pt. indicated feeling some better with swelling but still notices it at some point.  Pt. stated still workout outside more and more and feeling pretty good c getting back out to it.    Limitations  Standing;Walking    Patient Stated Goals  Indepent walking at PLOF s limitation.  Pt. expressed desire to jog and use elliptical at home.    Currently in Pain?  No/denies    Pain Score  0-No pain    Pain Location  Knee    Pain Orientation  Left    Pain Descriptors / Indicators  Tightness    Pain Onset  1 to 4 weeks ago                       [facility] Adult PT Treatment/Exercise - 12/13/19 0001      Neuro Re-ed    Neuro Re-ed Details   SLS 20 sec x 4 bilateral, SLS c ball toss x 10 each LE, wobble  board fwd/back and side to side 30 x each way       Knee/Hip Exercises: Stretches   Gastroc Stretch  Both;5 reps;30 seconds      Knee/Hip Exercises: Aerobic   Recumbent Bike  Full revolution lvl 3 7 mins      Knee/Hip Exercises: Machines for Strengthening   Other Machine  Shuttle SL leg press 82 lbs 3x 15 bilateral      Knee/Hip Exercises: Standing   Lateral Step Up  Both;3 sets;10 reps   6 inch c TKE band     Vasopneumatic   Number Minutes Vasopneumatic   10 minutes    Vasopnuematic Location   Knee    Vasopneumatic Pressure  Medium    Vasopneumatic Temperature   34             PT Education - 12/13/19 1029    Education Details  Cues for intervention continued occasionally.    Person(s) Educated  Patient    Methods  Explanation;Demonstration    Comprehension  Verbalized understanding;Returned demonstration       PT Short  Term Goals - 11/22/19 1016      PT SHORT TERM GOAL #1   Title  Patient will demonstrate independent use of home exercise program to maintain progress from in clinic treatments.    Baseline  11/22/2019: independent    Time  2    Period  Weeks    Status  Achieved    Target Date  11/17/19        PT Long Term Goals - 11/22/19 1016      PT LONG TERM GOAL #1   Title  Patient will demonstrate/report pain at worst less than or equal to 2/10 to facilitate minimal limitation in daily activity secondary to pain symptoms.    Baseline  11/22/2019 : 5/10 at worst    Time  8    Period  Weeks    Status  Partially Met    Target Date  12/29/19      PT LONG TERM GOAL #2   Title  Patient will demonstrate independent use of home exercise program to facilitate ability to maintain/progress functional gains from skilled physical therapy services.    Baseline  11/22/2019: independent    Time  8    Period  Weeks    Status  Achieved      PT LONG TERM GOAL #3   Title  Patient will demonstrate return to work/recreational activity at previous level of function  without limitations secondary due to condition    Time  8    Period  Weeks    Status  Not Met    Target Date  12/29/19      PT LONG TERM GOAL #4   Title  Pt. will demonstrate L knee AROM 0-110 degrees to facilitate usual transfers, walking, stairs at PLOF s limitation.    Baseline  11/22/2019: 0-100    Time  8    Period  Weeks    Status  Partially Met    Target Date  12/29/19      PT LONG TERM GOAL #5   Title  Patient will demonstrate independent ambulation community distances > 300 ft to facilitate community integration at [facility].    Baseline  11/22/2019: independent in clinic on even surfaces, uses cane intermittently in public    Time  8    Period  Weeks    Status  Partially Met    Target Date  12/29/19      PT LONG TERM GOAL #6   Title  Pt. will demonstrate LLE MMT 5/5 throughout to facilitate ability to perform usual work standing, squatting, lifting at PLOF s limitation.    Baseline  11/22/2019: Extension 4+/5    Time  8    Period  Weeks    Status  Partially Met    Target Date  12/29/19            Plan - 12/13/19 1038    Clinical Impression Statement  Dynamic stabilty control continued to show room for improvement.   Pt. has demonstrated continued gains in ambulation tolerance and strength at this time.  Pt. appropriate for transition to 1x/week due to progress and knowledge of HEP.    Examination-Activity Limitations  Lift;Carry;Squat;Stairs;Stand;Sit;Transfers;Bed Mobility;Bend    [employer] Activity;Driving;Yard Work    Stability/Clinical Decision Making  Stable/Uncomplicated    Rehab Potential  Good    PT Frequency  2x / week    PT Duration  8 weeks    PT Treatment/Interventions  Electrical Stimulation;Cryotherapy;Gait training;Stair training;Functional mobility  training;Therapeutic activities;Therapeutic exercise;Balance training;Neuromuscular re-education;Manual techniques;Patient/family education;Passive range of motion;Dry  needling;Joint Manipulations;Vasopneumatic Device;Taping    PT Next Visit Plan  1x/week for next few weeks until MD return.   Continue c POC for balance control.    PT Home Exercise Plan  Access Code: [email or web address]    Consulted and Agree with Plan of Care  Patient       Patient will benefit from skilled therapeutic intervention in order to improve the following deficits and impairments:  Abnormal gait, Decreased coordination, Decreased range of motion, Difficulty walking, Decreased endurance, Decreased activity tolerance, Pain, Impaired flexibility, Hypomobility, Decreased balance, Decreased mobility, Decreased strength, Increased edema  Visit Diagnosis: Muscle weakness (generalized)  Acute pain of left knee  Difficulty in walking, not elsewhere classified  Localized edema     Problem List Patient Active Problem List   Diagnosis Date Noted  . Status post total left knee replacement 10/16/2019  . Primary osteoarthritis of left knee 09/06/2019  . Health care maintenance 02/24/2019  . Chronic sinusitis 10/04/2018  . CARPAL TUNNEL SYNDROME, BILATERAL 08/04/2010  . GERD 03/06/2008  . Class 2 obesity due to excess calories with body mass index (BMI) of 38.0 to 38.9 in adult 02/10/2007  . Essential hypertension 02/10/2007    Scot Jun, PT, DPT, OCS, ATC 12/13/19  10:47 AM    [facility] Physical Therapy [address] [place], [place], [address] Phone: [phone]   Fax:  [phone]  Name: Hailey [name] MRN: [id number] Date of Birth: 1961-09-20

## 2019-12-14 ENCOUNTER — Encounter: Payer: Self-pay | Admitting: Podiatry

## 2019-12-14 NOTE — Progress Notes (Signed)
  Subjective:  Patient ID: Hailey [name], female    DOB: January 21, 1961,  MRN: [id number]  Chief Complaint  Patient presents with  . Nail Problem    pt is here for right great toenail falling off on the lateral side of the right great toenail, pt states that it is not painful, pt has also tried neosporin but not much has helped.   59 y.o. female returns for the above complaint.  Patient presents with painful elongated thickened discoloration mycotic toenails x10.  She states that they have been painful when ambulating.  She also states the right great toenail is kind of falling off to the lateral side.  Is been going for about a year.  Patient states is mildly painful.  Patient has tried Neosporin but has not helped much.  Patient also has secondary complaint of flexible pes planus and would like to know if there is any kind of insoles or orthotics that could be used within the shoes to support the arches of the foot.  She denies any other acute complaints.  Objective:  There were no vitals filed for this visit. Podiatric Exam: Vascular: dorsalis pedis and posterior tibial pulses are palpable bilateral. Capillary return is immediate. Temperature gradient is WNL. Skin turgor WNL  Sensorium: Normal Semmes Weinstein monofilament test. Normal tactile sensation bilaterally. Nail Exam: Pt has thick disfigured discolored nails with subungual debris noted bilateral entire nail hallux through fifth toenails Ulcer Exam: There is no evidence of ulcer or pre-ulcerative changes or infection. Orthopedic Exam: Muscle tone and strength are WNL. No limitations in general ROM. No crepitus or effusions noted. HAV  B/L.  Hammer toes 2-5  B/L. Skin: No Porokeratosis. No infection or ulcers  Assessment & Plan:  Patient was evaluated and treated and all questions answered.   Flexible pes planus deformity -I explained to the patient the etiology of pes planus deformity and various treatment options that were associated  with it.  I believe that she will benefit from custom-made orthotics to help address the arches of the foot as well as control the hindfoot motion.  Patient will be scheduled to see [facility] for custom-made orthotics.  Onychomycosis with pain  -Nails palliatively debrided as below. -Educated on self-care  Procedure: Nail Debridement Rationale: pain  Type of Debridement: manual, sharp debridement. Instrumentation: Nail nipper, rotary burr. Number of Nails: 10  Procedures and Treatment: Consent by patient was obtained for treatment procedures. The patient understood the discussion of treatment and procedures well. All questions were answered thoroughly reviewed. Debridement of mycotic and hypertrophic toenails, 1 through 5 bilateral and clearing of subungual debris. No ulceration, no infection noted.  Return Visit-Office Procedure: Patient instructed to return to the office for a follow up visit 3 months for continued evaluation and treatment.  Nicholes Rough, DPM    Return in about 3 months (around 03/14/2020) for General foot care.

## 2019-12-15 ENCOUNTER — Encounter: Payer: BC Managed Care – PPO | Admitting: Rehabilitative and Restorative Service Providers"

## 2019-12-21 ENCOUNTER — Ambulatory Visit (INDEPENDENT_AMBULATORY_CARE_PROVIDER_SITE_OTHER): Payer: BC Managed Care – PPO | Admitting: Rehabilitative and Restorative Service Providers"

## 2019-12-21 ENCOUNTER — Other Ambulatory Visit: Payer: Self-pay

## 2019-12-21 ENCOUNTER — Encounter: Payer: Self-pay | Admitting: Rehabilitative and Restorative Service Providers"

## 2019-12-21 DIAGNOSIS — M25562 Pain in left knee: Secondary | ICD-10-CM

## 2019-12-21 DIAGNOSIS — M6281 Muscle weakness (generalized): Secondary | ICD-10-CM

## 2019-12-21 DIAGNOSIS — R262 Difficulty in walking, not elsewhere classified: Secondary | ICD-10-CM

## 2019-12-21 DIAGNOSIS — R600 Localized edema: Secondary | ICD-10-CM

## 2019-12-21 NOTE — Therapy (Signed)
[place], [place], [address] Phone: [phone]   Fax:  [phone]  Physical Therapy Treatment  Patient Details  Name: ADRINE HAYWORTH MRN: [id number] Date of Birth: 08-04-1961 Referring Provider (PT): Purcell Mouton Date: 12/21/2019  PT End of Session - 12/21/19 0846    Visit Number  17    Number of Visits  24    Date for PT Re-Evaluation  12/30/19    PT Start Time  0822    PT Stop Time  0856    PT Time Calculation (min)  34 min    Activity Tolerance  Patient tolerated treatment well    Behavior During Therapy  [facility] for tasks assessed/performed       Past Medical History:  Diagnosis Date  . Arthritis   . CARPAL TUNNEL SYNDROME, BILATERAL 08/04/2010  . GERD 03/06/2008  . HYPERTENSION 02/10/2007  . Obesity   . OBESITY NOS 02/10/2007    Past Surgical History:  Procedure Laterality Date  . HEMORRHOID SURGERY    . NASAL SINUS SURGERY    . TOTAL KNEE ARTHROPLASTY Left 10/16/2019   Procedure: LEFT TOTAL KNEE ARTHROPLASTY;  Surgeon: Leandrew Koyanagi, MD;  Location: [facility];  Service: Orthopedics;  Laterality: Left;    There were no vitals filed for this visit.  Subjective Assessment - 12/21/19 0824    Subjective  Pt. indicated feeling posterior knee pulling /soreness bilaterally after using elliptical for 20 mins.  Rt side has improved more than Lt.  Pt. stated stiffness and symptoms at 3/10 in Lt posterior knee.    Limitations  Standing;Walking    Patient Stated Goals  Indepent walking at PLOF s limitation.  Pt. expressed desire to jog and use elliptical at home.    Pain Score  3     Pain Location  Knee    Pain Orientation  Left    Pain Onset  1 to 4 weeks ago                       [facility] Adult PT Treatment/Exercise - 12/21/19 0001      Knee/Hip Exercises: Stretches   Passive Hamstring Stretch  3 reps;20 seconds    Gastroc Stretch  Both;5 reps;30 seconds      Knee/Hip Exercises: Aerobic   Nustep   L5 5 mins      Knee/Hip Exercises: Standing   Functional Squat  20 reps    Other Standing Knee Exercises  Reverse ambulation 15 ft x 5       Vasopneumatic   Number Minutes Vasopneumatic   10 minutes    Vasopnuematic Location   Knee    Vasopneumatic Pressure  Medium    Vasopneumatic Temperature   34      Manual Therapy   Joint Mobilization  g2-g3 ap L knee jt mobs    Muscle Energy Technique  Lt hamstring passive SLR MET             PT Education - 12/21/19 0825    Education Details  Cues for prop in knee extension, hamstring stretching.  Cues for reducing load in new activity.    Person(s) Educated  Patient    Methods  Explanation    Comprehension  Verbalized understanding       PT Short Term Goals - 11/22/19 1016      PT SHORT TERM GOAL #1   Title  Patient will demonstrate independent use of home exercise program  to maintain progress from in clinic treatments.    Baseline  11/22/2019: independent    Time  2    Period  Weeks    Status  Achieved    Target Date  11/17/19        PT Long Term Goals - 11/22/19 1016      PT LONG TERM GOAL #1   Title  Patient will demonstrate/report pain at worst less than or equal to 2/10 to facilitate minimal limitation in daily activity secondary to pain symptoms.    Baseline  11/22/2019 : 5/10 at worst    Time  8    Period  Weeks    Status  Partially Met    Target Date  12/29/19      PT LONG TERM GOAL #2   Title  Patient will demonstrate independent use of home exercise program to facilitate ability to maintain/progress functional gains from skilled physical therapy services.    Baseline  11/22/2019: independent    Time  8    Period  Weeks    Status  Achieved      PT LONG TERM GOAL #3   Title  Patient will demonstrate return to work/recreational activity at previous level of function without limitations secondary due to condition    Time  8    Period  Weeks    Status  Not Met    Target Date  12/29/19      PT LONG TERM GOAL  #4   Title  Pt. will demonstrate L knee AROM 0-110 degrees to facilitate usual transfers, walking, stairs at PLOF s limitation.    Baseline  11/22/2019: 0-100    Time  8    Period  Weeks    Status  Partially Met    Target Date  12/29/19      PT LONG TERM GOAL #5   Title  Patient will demonstrate independent ambulation community distances > 300 ft to facilitate community integration at [facility].    Baseline  11/22/2019: independent in clinic on even surfaces, uses cane intermittently in public    Time  8    Period  Weeks    Status  Partially Met    Target Date  12/29/19      PT LONG TERM GOAL #6   Title  Pt. will demonstrate LLE MMT 5/5 throughout to facilitate ability to perform usual work standing, squatting, lifting at PLOF s limitation.    Baseline  11/22/2019: Extension 4+/5    Time  8    Period  Weeks    Status  Partially Met    Target Date  12/29/19            Plan - 12/21/19 0845    Clinical Impression Statement  Presentation indicated muscle fatigue/soreness from increased activity level, primarily in hamstring middle and distal.  Adjusted HEP to incorporate gentle stretching techniques.  Advised adjusted return to activity levels to help reduce DOMs.    Examination-Activity Limitations  Lift;Carry;Squat;Stairs;Stand;Sit;Transfers;Bed Mobility;Bend    [employer] Activity;Driving;Yard Work    Stability/Clinical Decision Making  Stable/Uncomplicated    Rehab Potential  Good    PT Frequency  2x / week    PT Duration  8 weeks    PT Treatment/Interventions  Electrical Stimulation;Cryotherapy;Gait training;Stair training;Functional mobility training;Therapeutic activities;Therapeutic exercise;Balance training;Neuromuscular re-education;Manual techniques;Patient/family education;Passive range of motion;Dry needling;Joint Manipulations;Vasopneumatic Device;Taping    PT Next Visit Plan  Review adjustments to HEP for posterior thigh/knee soreness.  PT Home Exercise Plan  Access Code: [email or web address]    Consulted and Agree with Plan of Care  Patient       Patient will benefit from skilled therapeutic intervention in order to improve the following deficits and impairments:  Abnormal gait, Decreased coordination, Decreased range of motion, Difficulty walking, Decreased endurance, Decreased activity tolerance, Pain, Impaired flexibility, Hypomobility, Decreased balance, Decreased mobility, Decreased strength, Increased edema  Visit Diagnosis: Acute pain of left knee  Muscle weakness (generalized)  Difficulty in walking, not elsewhere classified  Localized edema     Problem List Patient Active Problem List   Diagnosis Date Noted  . Status post total left knee replacement 10/16/2019  . Primary osteoarthritis of left knee 09/06/2019  . Health care maintenance 02/24/2019  . Chronic sinusitis 10/04/2018  . CARPAL TUNNEL SYNDROME, BILATERAL 08/04/2010  . GERD 03/06/2008  . Class 2 obesity due to excess calories with body mass index (BMI) of 38.0 to 38.9 in adult 02/10/2007  . Essential hypertension 02/10/2007   Scot Jun, PT, DPT, OCS, ATC 12/21/19  8:47 AM    [facility] Physical Therapy [address] [place], [place], [address] Phone: [phone]   Fax:  [phone]  Name: LAQUIA ROSANO MRN: [id number] Date of Birth: [birth year]

## 2019-12-23 ENCOUNTER — Ambulatory Visit: Payer: BC Managed Care – PPO | Attending: Internal Medicine

## 2019-12-23 DIAGNOSIS — Z23 Encounter for immunization: Secondary | ICD-10-CM

## 2019-12-23 NOTE — Progress Notes (Signed)
   Covid-19 Vaccination Clinic  Name:  Hailey [name]    MRN: [id number] DOB: 28-Apr-1961  12/23/2019  Ms. [name] was observed post Covid-19 immunization for 15 minutes without incident. She was provided with Vaccine Information Sheet and instruction to access the V-Safe system.   Ms. [name] was instructed to call 911 with any severe reactions post vaccine: Marland Kitchen Difficulty breathing  . Swelling of face and throat  . A fast heartbeat  . A bad rash all over body  . Dizziness and weakness   Immunizations Administered    Name Date Dose VIS Date Route   Pfizer COVID-19 Vaccine 12/23/2019  1:36 PM 0.3 mL 09/08/2019 Intramuscular   Manufacturer: [employer], [employer]   Lot: [id number]   NDC: [address]-5

## 2019-12-27 ENCOUNTER — Encounter: Payer: Self-pay | Admitting: Rehabilitative and Restorative Service Providers"

## 2019-12-27 ENCOUNTER — Other Ambulatory Visit: Payer: Self-pay

## 2019-12-27 ENCOUNTER — Ambulatory Visit (INDEPENDENT_AMBULATORY_CARE_PROVIDER_SITE_OTHER): Payer: BC Managed Care – PPO | Admitting: Rehabilitative and Restorative Service Providers"

## 2019-12-27 DIAGNOSIS — M6281 Muscle weakness (generalized): Secondary | ICD-10-CM | POA: Diagnosis not present

## 2019-12-27 DIAGNOSIS — R262 Difficulty in walking, not elsewhere classified: Secondary | ICD-10-CM | POA: Diagnosis not present

## 2019-12-27 DIAGNOSIS — M25562 Pain in left knee: Secondary | ICD-10-CM | POA: Diagnosis not present

## 2019-12-27 DIAGNOSIS — R600 Localized edema: Secondary | ICD-10-CM | POA: Diagnosis not present

## 2019-12-27 NOTE — Therapy (Addendum)
[place], [place], [address] Phone: [phone]   Fax:  [phone]  Physical Therapy Treatment/Discharge  Patient Details  Name: Hailey [name] MRN: [id number] Date of Birth: 08/01/1961 Referring Provider (PT): Erlinda Hong   Encounter Date: 12/27/2019  PT End of Session - 12/27/19 0936    Visit Number  18    Number of Visits  24    Date for PT Re-Evaluation  12/30/19    PT Start Time  0928    PT Stop Time  1017    PT Time Calculation (min)  49 min    Activity Tolerance  Patient tolerated treatment well    Behavior During Therapy  [facility] for tasks assessed/performed       Past Medical History:  Diagnosis Date  . Arthritis   . CARPAL TUNNEL SYNDROME, BILATERAL 08/04/2010  . GERD 03/06/2008  . HYPERTENSION 02/10/2007  . Obesity   . OBESITY NOS 02/10/2007    Past Surgical History:  Procedure Laterality Date  . HEMORRHOID SURGERY    . NASAL SINUS SURGERY    . TOTAL KNEE ARTHROPLASTY Left 10/16/2019   Procedure: LEFT TOTAL KNEE ARTHROPLASTY;  Surgeon: Leandrew Koyanagi, MD;  Location: [facility];  Service: Orthopedics;  Laterality: Left;    There were no vitals filed for this visit.  Subjective Assessment - 12/27/19 0934    Subjective  Pt. indicated feeling pretty good since last visit.  Pt. stated taking it easier on progression in yard work and exercise routine.  No specific pain to report today, just some tightness.    Limitations  Standing;Walking    Patient Stated Goals  Indepent walking at PLOF s limitation.  Pt. expressed desire to jog and use elliptical at home.    Currently in Pain?  No/denies    Pain Score  0-No pain    Pain Location  Knee    Pain Orientation  Left    Pain Descriptors / Indicators  Tightness    Pain Type  Acute pain;Surgical pain    Pain Onset  1 to 4 weeks ago    Pain Frequency  Occasional    Aggravating Factors   Step down, increased standing    Pain Relieving Factors  Resting, more moderate exercise          [facility] PT Assessment - 12/27/19 0001      Single Leg Stance   Comments  LLE SLS 15 seconds      AROM   Left Knee Extension  0      Strength   Left Knee Flexion  5/5    Left Knee Extension  5/5    Left Ankle Dorsiflexion  5/5                   [facility] Adult PT Treatment/Exercise - 12/27/19 0001      Neuro Re-ed    Neuro Re-ed Details   SLS c cone touching 3 anterior x 10 bilateral, lateral stepping over 6 inch cones 10 ft x 5 each way      Knee/Hip Exercises: Stretches   Passive Hamstring Stretch  Left;5 reps;30 seconds    Gastroc Stretch  5 reps;30 seconds;Both   incline board     Knee/Hip Exercises: Aerobic   Recumbent Bike  Lvl 1 10 mins      Knee/Hip Exercises: Plyometrics   Other Plyometric Exercises  SL leg press on plyo 75 lbs 3 x 10, bilateral      Knee/Hip  Exercises: Standing   Functional Squat  3 sets;10 reps   20 lbs to 14 inch box             PT Education - 12/27/19 0935    Education Details  Intervention cues at times.    Person(s) Educated  Patient    Methods  Explanation;Verbal cues    Comprehension  Verbalized understanding       PT Short Term Goals - 11/22/19 1016      PT SHORT TERM GOAL #1   Title  Patient will demonstrate independent use of home exercise program to maintain progress from in clinic treatments.    Baseline  11/22/2019: independent    Time  2    Period  Weeks    Status  Achieved    Target Date  11/17/19        PT Long Term Goals - 12/27/19 1009      PT LONG TERM GOAL #1   Title  Patient will demonstrate/report pain at worst less than or equal to 2/10 to facilitate minimal limitation in daily activity secondary to pain symptoms.    Time  8    Period  Weeks    Status  Achieved      PT LONG TERM GOAL #2   Title  Patient will demonstrate independent use of home exercise program to facilitate ability to maintain/progress functional gains from skilled physical therapy services.    Baseline   11/22/2019: independent    Time  8    Period  Weeks    Status  Achieved      PT LONG TERM GOAL #3   Title  Patient will demonstrate return to work/recreational activity at previous level of function without limitations secondary due to condition    Time  8    Period  Weeks    Status  Partially Met      PT LONG TERM GOAL #4   Title  Pt. will demonstrate L knee AROM 0-110 degrees to facilitate usual transfers, walking, stairs at PLOF s limitation.    Time  8    Period  Weeks    Status  Achieved      PT LONG TERM GOAL #5   Title  Patient will demonstrate independent ambulation community distances > 300 ft to facilitate community integration at [facility].    Time  8    Period  Weeks    Status  Achieved      PT LONG TERM GOAL #6   Title  Pt. will demonstrate LLE MMT 5/5 throughout to facilitate ability to perform usual work standing, squatting, lifting at PLOF s limitation.    Time  8    Period  Weeks    Status  Achieved            Plan - 12/27/19 0959    Clinical Impression Statement  Reduction in symptoms indicated from last visit (DOMs).  Pt. continues to show indications of ready for d/c that is timed with last MD visit.    Examination-Activity Limitations  Lift;Carry;Squat;Stairs;Stand;Sit;Transfers;Bed Mobility;Bend    [employer] Activity;Driving;Yard Work    Stability/Clinical Decision Making  Stable/Uncomplicated    Rehab Potential  Good    PT Frequency  Other (comment)   d/c to HEP   PT Duration  --    PT Treatment/Interventions  Electrical Stimulation;Cryotherapy;Gait training;Stair training;Functional mobility training;Therapeutic activities;Therapeutic exercise;Balance training;Neuromuscular re-education;Manual techniques;Patient/family education;Passive range of motion;Dry needling;Joint Manipulations;Vasopneumatic Device;Taping    PT Next  Visit Plan  Transition to HEP.    PT Home Exercise Plan  Access Code: [email or web address]     Consulted and Agree with Plan of Care  Patient       Patient will benefit from skilled therapeutic intervention in order to improve the following deficits and impairments:  Decreased coordination  Visit Diagnosis: Acute pain of left knee  Muscle weakness (generalized)  Difficulty in walking, not elsewhere classified  Localized edema     Problem List Patient Active Problem List   Diagnosis Date Noted  . Status post total left knee replacement 10/16/2019  . Primary osteoarthritis of left knee 09/06/2019  . Health care maintenance 02/24/2019  . Chronic sinusitis 10/04/2018  . CARPAL TUNNEL SYNDROME, BILATERAL 08/04/2010  . GERD 03/06/2008  . Class 2 obesity due to excess calories with body mass index (BMI) of 38.0 to 38.9 in adult 02/10/2007  . Essential hypertension 02/10/2007   PHYSICAL THERAPY DISCHARGE SUMMARY  Visits from Start of Care: 18  Current functional level related to goals / functional outcomes: Has reached all goals pending full return to work in next week or so   Remaining deficits: Will return to work over next week or so   Education / Equipment: HEP provided Plan: Patient agrees to discharge.  Patient goals were met. Patient is being discharged due to meeting the stated rehab goals.  ?????   Last few additional visits were performed mainly due to patient anxiety for d/c.  Patient has confidence in d/c at this time.     Scot Jun, PT, DPT, OCS, ATC 12/27/19  10:21 AM    [facility] Physical Therapy [address] [place], [place], [address] Phone: [phone]   Fax:  [phone]  Name: Hailey [name] MRN: [id number] Date of Birth: January 18, 1961

## 2019-12-28 ENCOUNTER — Ambulatory Visit (INDEPENDENT_AMBULATORY_CARE_PROVIDER_SITE_OTHER): Payer: BC Managed Care – PPO | Admitting: Orthotics

## 2019-12-28 DIAGNOSIS — M2142 Flat foot [pes planus] (acquired), left foot: Secondary | ICD-10-CM | POA: Diagnosis not present

## 2019-12-28 DIAGNOSIS — B351 Tinea unguium: Secondary | ICD-10-CM

## 2019-12-28 DIAGNOSIS — M79675 Pain in left toe(s): Secondary | ICD-10-CM

## 2019-12-28 DIAGNOSIS — M2141 Flat foot [pes planus] (acquired), right foot: Secondary | ICD-10-CM

## 2019-12-28 NOTE — Progress Notes (Signed)
Patient presents today with a hx of PTTD/AAF.  Upon assessment, patient has pronounced pes planus w/ a valgus RF deformity.  Patient has medially shifted talus/navicular.  Goal is provide longitudinal arch support and RF stability.  Plan on deep heel cup, hug arch, wide foot orthosis w/ medial flange and varus correction for RF valgus deformity.  Patient educated in the progessive nature of PTTD and financial responsibility.  

## 2019-12-28 NOTE — Addendum Note (Signed)
Addended by: Ria Clock on: 12/28/2019 11:08 AM   Modules accepted: Level of Service

## 2019-12-30 ENCOUNTER — Other Ambulatory Visit: Payer: Self-pay | Admitting: Family Medicine

## 2019-12-30 DIAGNOSIS — I10 Essential (primary) hypertension: Secondary | ICD-10-CM

## 2020-01-01 NOTE — Telephone Encounter (Signed)
Called patient to schedule follow up visit, no answer LMTCB

## 2020-01-02 ENCOUNTER — Ambulatory Visit: Payer: BC Managed Care – PPO

## 2020-01-04 ENCOUNTER — Encounter: Payer: Self-pay | Admitting: Orthopaedic Surgery

## 2020-01-04 ENCOUNTER — Other Ambulatory Visit: Payer: Self-pay

## 2020-01-04 ENCOUNTER — Ambulatory Visit (INDEPENDENT_AMBULATORY_CARE_PROVIDER_SITE_OTHER): Payer: BC Managed Care – PPO | Admitting: Orthopaedic Surgery

## 2020-01-04 VITALS — Ht 65.5 in | Wt 235.0 lb

## 2020-01-04 DIAGNOSIS — Z96652 Presence of left artificial knee joint: Secondary | ICD-10-CM

## 2020-01-04 NOTE — Progress Notes (Signed)
   Post-Op Visit Note   Patient: Hailey [name]           Date of Birth: 01/29/1961           MRN: [id number] Visit Date: 01/04/2020 PCP: Mliss Sax, MD   Assessment & Plan:  Chief Complaint:  Chief Complaint  Patient presents with  . Left Knee - Follow-up    Left total knee arthroplasty DOS 10/16/2019   Visit Diagnoses:  1. Status post total left knee replacement     Plan: [name] is 11 weeks status post left total knee replacement.  She is doing well overall.  She is ready to return back to work tomorrow.  She has been gardening.  She has finished outpatient physical therapy.  She is pleased with her outcome from the surgery.  Surgical scar is fully healed.  Range of motion back to baseline.  Minimal swelling.  At this point she may continue to increase activity as tolerated.  She will continue with home exercises for strengthening.  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  Questions encouraged and answered.  Recheck in 3 months with two-view x-rays of the left knee.  Follow-Up Instructions: Return in about 3 months (around 04/04/2020).   Orders:  No orders of the defined types were placed in this encounter.  No orders of the defined types were placed in this encounter.   Imaging: No results found.  PMFS History: Patient Active Problem List   Diagnosis Date Noted  . Status post total left knee replacement 10/16/2019  . Primary osteoarthritis of left knee 09/06/2019  . Health care maintenance 02/24/2019  . Chronic sinusitis 10/04/2018  . CARPAL TUNNEL SYNDROME, BILATERAL 08/04/2010  . GERD 03/06/2008  . Class 2 obesity due to excess calories with body mass index (BMI) of 38.0 to 38.9 in adult 02/10/2007  . Essential hypertension 02/10/2007   Past Medical History:  Diagnosis Date  . Arthritis   . CARPAL TUNNEL SYNDROME, BILATERAL 08/04/2010  . GERD 03/06/2008  . HYPERTENSION 02/10/2007  . Obesity   . OBESITY NOS 02/10/2007    History  reviewed. No pertinent family history.  Past Surgical History:  Procedure Laterality Date  . HEMORRHOID SURGERY    . NASAL SINUS SURGERY    . TOTAL KNEE ARTHROPLASTY Left 10/16/2019   Procedure: LEFT TOTAL KNEE ARTHROPLASTY;  Surgeon: Tarry Kos, MD;  Location: [facility];  Service: Orthopedics;  Laterality: Left;   Social History   Occupational History  . Not on file  Tobacco Use  . Smoking status: Never Smoker  . Smokeless tobacco: Never Used  Substance and Sexual Activity  . Alcohol use: Yes    Comment: rarley  . Drug use: No  . Sexual activity: Not on file

## 2020-01-22 ENCOUNTER — Ambulatory Visit: Payer: BC Managed Care – PPO | Admitting: Orthotics

## 2020-01-22 ENCOUNTER — Other Ambulatory Visit: Payer: Self-pay

## 2020-01-22 DIAGNOSIS — M2141 Flat foot [pes planus] (acquired), right foot: Secondary | ICD-10-CM

## 2020-01-22 NOTE — Progress Notes (Signed)
Patient came in today to pick up custom made foot orthotics.  The goals were accomplished and the patient reported no dissatisfaction with said orthotics.  Patient was advised of breakin period and how to report any issues. 

## 2020-01-24 ENCOUNTER — Telehealth: Payer: BC Managed Care – PPO

## 2020-01-24 ENCOUNTER — Ambulatory Visit (INDEPENDENT_AMBULATORY_CARE_PROVIDER_SITE_OTHER)
Admission: RE | Admit: 2020-01-24 | Discharge: 2020-01-24 | Disposition: A | Discharge status: Home / Self Care | Payer: BC Managed Care – PPO | Source: Ambulatory Visit

## 2020-01-24 DIAGNOSIS — J0100 Acute maxillary sinusitis, unspecified: Secondary | ICD-10-CM | POA: Diagnosis not present

## 2020-01-24 MED ORDER — AZITHROMYCIN 250 MG PO TABS: 250.0000 mg | ORAL_TABLET | Freq: Every day | ORAL | 0 refills | Status: DC

## 2020-01-24 NOTE — ED Provider Notes (Signed)
Virtual Visit via Video Note:  Hailey [name]  initiated request for Telemedicine visit with [facility] team. I connected with Hailey [name]  on 01/24/2020 at 4:14 PM  for a synchronized telemedicine visit using a video enabled HIPPA compliant telemedicine application. I verified that I am speaking with Hailey [name]  using two identifiers. Mickie Bail, NP  was physically located in a [facility]gent care site and Faustina Gebert Dershem was located at a different location.   The limitations of evaluation and management by telemedicine as well as the availability of in-person appointments were discussed. Patient was informed that she  may incur a bill ( including co-pay) for this virtual visit encounter. Giavonna P Arrighi  expressed understanding and gave verbal consent to proceed with virtual visit.     History of Present Illness:Hailey [name]  is a 59 y.o. female presents for evaluation of 6 day history of sinus headache, congestion, maxillary sinus pain, mild sore throat, and ear discomfort.  She has been treating her symptoms with a Nettie pot, Zyrtec-D, and Mucinex.  She denies fever, chills, cough, shortness of breath, vomiting, diarrhea, rash, or other symptoms.  She has a history of sinus infections and sinus surgery.     Allergies  Allergen Reactions  . Sulfamethoxazole Hives     Past Medical History:  Diagnosis Date  . Arthritis   . CARPAL TUNNEL SYNDROME, BILATERAL 08/04/2010  . GERD 03/06/2008  . HYPERTENSION 02/10/2007  . Obesity   . OBESITY NOS 02/10/2007     Social History   Tobacco Use  . Smoking status: Never Smoker  . Smokeless tobacco: Never Used  Substance Use Topics  . Alcohol use: Yes    Comment: rarley  . Drug use: No   ROS: as stated in HPI.  All other systems reviewed and negative.      Observations/Objective: Physical Exam  VITALS: Patient denies fever. GENERAL: Alert, appears well and in no acute distress. HEENT: Atraumatic. NECK:  Normal movements of the head and neck. CARDIOPULMONARY: No increased WOB. Speaking in clear sentences. I:E ratio WNL.  MS: Moves all visible extremities without noticeable abnormality. PSYCH: Pleasant and cooperative, well-groomed. Speech normal rate and rhythm. Affect is appropriate. Insight and judgement are appropriate. Attention is focused, linear, and appropriate.  NEURO: CN grossly intact. Oriented as arrived to appointment on time with no prompting. Moves both UE equally.  SKIN: No obvious lesions, wounds, erythema, or cyanosis noted on face or hands.   Assessment and Plan:    ICD-10-CM   1. Acute non-recurrent maxillary sinusitis  J01.00        Follow Up Instructions: Treating with Zithromax (patient was on amoxicillin in March for dental issue).  Encouraged her to continue symptomatic treatment also.  Instructed her to follow-up with her PCP or come here to be seen in person if her symptoms or not improving.  Patient agrees to plan of care.    I discussed the assessment and treatment plan with the patient. The patient was provided an opportunity to ask questions and all were answered. The patient agreed with the plan and demonstrated an understanding of the instructions.   The patient was advised to call back or seek an in-person evaluation if the symptoms worsen or if the condition fails to improve as anticipated.      Mickie Bail, NP  01/24/2020 4:14 PM         Mickie Bail, NP 01/24/20 1614

## 2020-01-24 NOTE — Discharge Instructions (Signed)
Take the antibiotic as directed.    Follow up with your primary care provider or come here to be seen in person if your symptoms are not improving.    

## 2020-02-21 ENCOUNTER — Other Ambulatory Visit: Payer: Self-pay

## 2020-02-21 ENCOUNTER — Ambulatory Visit: Payer: Self-pay

## 2020-02-21 ENCOUNTER — Encounter: Payer: Self-pay | Admitting: Orthopaedic Surgery

## 2020-02-21 ENCOUNTER — Ambulatory Visit (INDEPENDENT_AMBULATORY_CARE_PROVIDER_SITE_OTHER): Payer: BC Managed Care – PPO | Admitting: Orthopaedic Surgery

## 2020-02-21 DIAGNOSIS — Z96652 Presence of left artificial knee joint: Secondary | ICD-10-CM | POA: Diagnosis not present

## 2020-02-21 NOTE — Progress Notes (Signed)
Office Visit Note   Patient: Hailey [name]           Date of Birth: Jan 29, 1961           MRN: [id number] Visit Date: 02/21/2020              Requested by: Mliss Sax, MD [address] [place],  [place] [address] PCP: Mliss Sax, MD   Assessment & Plan: Visit Diagnoses:  1. Status post total left knee replacement     Plan: Impression is left lower leg pain likely overuse and muscular in nature.  Patient will let me know if she does not improve with rest and heat and NSAIDs.  If she does develop increased swelling she will let us know so that we can order a Doppler to rule out DVT.  Overall suspicion for DVT is low therefore we will just treat this symptomatically for now.  Follow-Up Instructions: Return if symptoms worsen or fail to improve.   Orders:  Orders Placed This Encounter  Procedures  . XR Knee 1-2 Views Left   No orders of the defined types were placed in this encounter.     Procedures: No procedures performed   Clinical Data: No additional findings.   Subjective: Chief Complaint  Patient presents with  . Left Leg - Pain    [name] returns today for checkup of recent onset of left lateral leg pain that starts near the knee and radiates down.  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  She feels like it is muscular in nature.  Denies any chest pain or calf pain or shortness of breath.  Denies any trauma.  Denies any significant swelling.   Review of Systems  Constitutional: Negative.   HENT: Negative.   Eyes: Negative.   Respiratory: Negative.   Cardiovascular: Negative.   Endocrine: Negative.   Musculoskeletal: Negative.   Neurological: Negative.   Hematological: Negative.   Psychiatric/Behavioral: Negative.   All other systems reviewed and are negative.    Objective: Vital Signs: LMP 03/14/2014 (Approximate) Comment: perimenopause  Physical Exam Vitals and nursing note  reviewed.  Constitutional:      Appearance: She is well-developed.  Pulmonary:     Effort: Pulmonary effort is normal.  Skin:    General: Skin is warm.     Capillary Refill: Capillary refill takes less than 2 seconds.  Neurological:     Mental Status: She is alert and oriented to person, place, and time.  Psychiatric:        Behavior: Behavior normal.        Thought Content: Thought content normal.        Judgment: Judgment normal.     Ortho Exam Left leg and knee show fully healed surgical scar.  She is quite tender on the lateral aspect of the knee near the fibular head and anterior to this.  She has good strength with ankle dorsiflexion and eversion without pain to resistance.  Trace joint effusion.  No signs of infection.  Range of motion is painless.  Collaterals are stable. Specialty Comments:  No specialty comments available.  Imaging: XR Knee 1-2 Views Left  Result Date: 02/21/2020 Stable total knee replacement in good alignment.  No acute abnormalities of the tibia or the fibula    PMFS History: Patient Active Problem List   Diagnosis Date Noted  . Status post total left knee replacement 10/16/2019  . Primary osteoarthritis  of left knee 09/06/2019  . Health care maintenance 02/24/2019  . Chronic sinusitis 10/04/2018  . CARPAL TUNNEL SYNDROME, BILATERAL 08/04/2010  . GERD 03/06/2008  . Class 2 obesity due to excess calories with body mass index (BMI) of 38.0 to 38.9 in adult 02/10/2007  . Essential hypertension 02/10/2007   Past Medical History:  Diagnosis Date  . Arthritis   . CARPAL TUNNEL SYNDROME, BILATERAL 08/04/2010  . GERD 03/06/2008  . HYPERTENSION 02/10/2007  . Obesity   . OBESITY NOS 02/10/2007    History reviewed. No pertinent family history.  Past Surgical History:  Procedure Laterality Date  . HEMORRHOID SURGERY    . NASAL SINUS SURGERY    . TOTAL KNEE ARTHROPLASTY Left 10/16/2019   Procedure: LEFT TOTAL KNEE ARTHROPLASTY;  Surgeon: Leandrew Koyanagi, MD;  Location: [facility];  Service: Orthopedics;  Laterality: Left;   Social History   Occupational History  . Not on file  Tobacco Use  . Smoking status: Never Smoker  . Smokeless tobacco: Never Used  Substance and Sexual Activity  . Alcohol use: Yes    Comment: rarley  . Drug use: No  . Sexual activity: Not on file

## 2020-02-22 ENCOUNTER — Other Ambulatory Visit: Payer: Self-pay

## 2020-02-23 ENCOUNTER — Ambulatory Visit (INDEPENDENT_AMBULATORY_CARE_PROVIDER_SITE_OTHER): Payer: BC Managed Care – PPO | Admitting: Family Medicine

## 2020-02-23 ENCOUNTER — Encounter: Payer: Self-pay | Admitting: Family Medicine

## 2020-02-23 VITALS — BP 120/80 | HR 69 | Temp 97.6°F | Ht 65.5 in | Wt 235.0 lb

## 2020-02-23 DIAGNOSIS — T63421A Toxic effect of venom of ants, accidental (unintentional), initial encounter: Secondary | ICD-10-CM | POA: Insufficient documentation

## 2020-02-23 DIAGNOSIS — I10 Essential (primary) hypertension: Secondary | ICD-10-CM

## 2020-02-23 NOTE — Progress Notes (Signed)
Established Patient Office Visit  Subjective:  Patient ID: Hailey [name], female    DOB: 1961-05-18  Age: 59 y.o. MRN: [id number]  CC:  Chief Complaint  Patient presents with  . Insect Bite    Pt c/o a bug bite x 6 days ago.  Pt said she is still itching, and have little bite marks.    HPI [name] presents for follow-up of her hypertension that is been well controlled with the Benzapril.  She is having no issues taking it.  Medicine is working well for her.  6 days ago she was working in [employer] and felt bites underneath her pant leg.  She never saw what was biting her.  By the time she got home and took her pants off there was no evidence of an insect.  But she definitely felt the sensation of a biting insect.  She noted multiple bites around her right knee that seem to be healing.  There is no streaking fever headache or joint pain.  She had her left knee replaced in January and has been doing well.  Status post Covid vaccine.  Past Medical History:  Diagnosis Date  . Arthritis   . CARPAL TUNNEL SYNDROME, BILATERAL 08/04/2010  . GERD 03/06/2008  . HYPERTENSION 02/10/2007  . Obesity   . OBESITY NOS 02/10/2007    Past Surgical History:  Procedure Laterality Date  . HEMORRHOID SURGERY    . NASAL SINUS SURGERY    . TOTAL KNEE ARTHROPLASTY Left 10/16/2019   Procedure: LEFT TOTAL KNEE ARTHROPLASTY;  Surgeon: Tarry Kos, MD;  Location: [facility];  Service: Orthopedics;  Laterality: Left;    History reviewed. No pertinent family history.  Social History   Socioeconomic History  . Marital status: Married    Spouse name: Not on file  . Number of children: Not on file  . Years of education: Not on file  . Highest education level: Not on file  Occupational History  . Not on file  Tobacco Use  . Smoking status: Never Smoker  . Smokeless tobacco: Never Used  Substance and Sexual Activity  . Alcohol use: Yes    Comment: rarley  . Drug use: No  . Sexual  activity: Not on file  Other Topics Concern  . Not on file  Social History Narrative  . Not on file   Social Determinants of Health   Financial Resource Strain:   . Difficulty of Paying Living Expenses:   Food Insecurity:   . Worried About Programme researcher, broadcasting/film/video in the Last Year:   . Barista in the Last Year:   Transportation Needs:   . Freight forwarder (Medical):   Marland Kitchen Lack of Transportation (Non-Medical):   Physical Activity:   . Days of Exercise per Week:   . Minutes of Exercise per Session:   Stress:   . Feeling of Stress :   Social Connections:   . Frequency of Communication with Friends and Family:   . Frequency of Social Gatherings with Friends and Family:   . Attends Religious Services:   . Active Member of Clubs or Organizations:   . Attends Banker Meetings:   Marland Kitchen Marital Status:   Intimate Partner Violence:   . Fear of Current or Ex-Partner:   . Emotionally Abused:   Marland Kitchen Physically Abused:   . Sexually Abused:     Outpatient Medications Prior to Visit  Medication Sig Dispense Refill  . benazepril (  LOTENSIN) 20 MG tablet TAKE 1 TABLET BY MOUTH EVERY DAY 90 tablet 0  . cetirizine-pseudoephedrine (ZYRTEC-D) 5-120 MG tablet Take 1 tablet by mouth daily as needed for allergies.    . cholecalciferol (VITAMIN D3) 25 MCG (1000 UNIT) tablet Take 1,000 Units by mouth daily.    Marland Kitchen FERROUS SULFATE PO Take 1 tablet by mouth daily.    Marland Kitchen ibuprofen (ADVIL) 800 MG tablet Take 1 tablet (800 mg total) by mouth every 8 (eight) hours as needed. 90 tablet 0  . Multiple Vitamin (MULTIVITAMIN WITH MINERALS) TABS tablet Take 1 tablet by mouth daily.    . VOLTAREN 1 % GEL APPLY 2 GRAMS TO AFFECTED AREA 4 TIMES A DAY 100 g 5  . amoxicillin (AMOXIL) 500 MG capsule     . HYDROcodone-acetaminophen (NORCO) 5-325 MG tablet Take 1-2 tablets by mouth 3 (three) times daily as needed for moderate pain. (Patient not taking: Reported on 02/23/2020) 20 tablet 0  . ondansetron  (ZOFRAN) 4 MG tablet Take 1-2 tablets (4-8 mg total) by mouth every 8 (eight) hours as needed for nausea or vomiting. (Patient not taking: Reported on 02/23/2020) 20 tablet 0  . oxyCODONE-acetaminophen (PERCOCET) 5-325 MG tablet Take 1-2 tablets by mouth 3 (three) times daily as needed for severe pain. (Patient not taking: Reported on 02/23/2020) 30 tablet 0  . traMADol (ULTRAM) 50 MG tablet Take 1 tablet (50 mg total) by mouth daily as needed. (Patient not taking: Reported on 02/23/2020) 30 tablet 2  . aspirin EC 81 MG tablet Take 1 tablet (81 mg total) by mouth 2 (two) times daily. (Patient not taking: Reported on 02/23/2020) 84 tablet 0  . azithromycin (ZITHROMAX) 250 MG tablet Take 1 tablet (250 mg total) by mouth daily. Take first 2 tablets together, then 1 every day until finished. (Patient not taking: Reported on 02/23/2020) 6 tablet 0  . ketorolac (TORADOL) 10 MG tablet Take 1 tablet (10 mg total) by mouth 2 (two) times daily as needed. 10 tablet 0  . methocarbamol (ROBAXIN) 750 MG tablet Take 1 tablet (750 mg total) by mouth 2 (two) times daily as needed for muscle spasms. 20 tablet 3   No facility-administered medications prior to visit.    Allergies  Allergen Reactions  . Sulfamethoxazole Hives    ROS Review of Systems  Constitutional: Negative.   HENT: Negative.   Eyes: Negative for photophobia and visual disturbance.  Respiratory: Negative.   Cardiovascular: Negative.   Gastrointestinal: Negative.   Endocrine: Negative for polyphagia and polyuria.  Musculoskeletal: Negative for gait problem and joint swelling.  Skin: Positive for color change and rash.  Neurological: Negative for headaches.  Psychiatric/Behavioral: Negative.       Objective:    Physical Exam  Constitutional: She is oriented to person, place, and time. She appears well-developed and well-nourished. No distress.  HENT:  Head: Normocephalic and atraumatic.  Right Ear: External ear normal.  Left Ear:  External ear normal.  Eyes: Conjunctivae are normal. Right eye exhibits no discharge. Left eye exhibits no discharge. No scleral icterus.  Neck: No JVD present. No tracheal deviation present.  Cardiovascular: Normal rate, regular rhythm and normal heart sounds.  Pulmonary/Chest: Effort normal and breath sounds normal. No stridor.  Abdominal: Bowel sounds are normal.  Musculoskeletal:        General: No edema.  Neurological: She is alert and oriented to person, place, and time.  Skin: She is not diaphoretic.     Psychiatric: She has a normal mood and  affect. Her behavior is normal.    BP 120/80 (BP Location: Left Arm, Patient Position: Sitting, Cuff Size: Normal)   Pulse 69   Temp 97.6 F (36.4 C) (Temporal)   Ht 5' 5.5" (1.664 m)   Wt 235 lb (106.6 kg)   LMP 03/14/2014 (Approximate) Comment: perimenopause  SpO2 96%   BMI 38.51 kg/m  Wt Readings from Last 3 Encounters:  02/23/20 235 lb (106.6 kg)  01/04/20 235 lb (106.6 kg)  10/16/19 235 lb 14.3 oz (107 kg)     Health Maintenance Due  Topic Date Due  . Hepatitis C Screening  Never done  . HIV Screening  Never done    There are no preventive care reminders to display for this patient.  Lab Results  Component Value Date   TSH 1.65 02/24/2019   Lab Results  Component Value Date   WBC 5.0 10/12/2019   HGB 13.2 10/12/2019   HCT 40.1 10/12/2019   MCV 92.0 10/12/2019   PLT 343 10/12/2019   Lab Results  Component Value Date   NA 139 10/12/2019   K 4.8 10/12/2019   CO2 25 10/12/2019   GLUCOSE 88 10/12/2019   BUN 12 10/12/2019   CREATININE 0.66 10/12/2019   BILITOT 0.8 10/12/2019   ALKPHOS 90 10/12/2019   AST 20 10/12/2019   ALT 17 10/12/2019   PROT 7.3 10/12/2019   ALBUMIN 4.0 10/12/2019   CALCIUM 9.5 10/12/2019   ANIONGAP 10 10/12/2019   GFR 105.71 02/24/2019   Lab Results  Component Value Date   CHOL 159 02/24/2019   Lab Results  Component Value Date   HDL 64.60 02/24/2019   Lab Results    Component Value Date   LDLCALC 84 02/24/2019   Lab Results  Component Value Date   TRIG 52.0 02/24/2019   Lab Results  Component Value Date   CHOLHDL 2 02/24/2019   No results found for: HGBA1C    Assessment & Plan:   Problem List Items Addressed This Visit      Cardiovascular and Mediastinum   Essential hypertension - Primary   Relevant Orders   Basic metabolic panel   CBC     Other   Fire ant bite      No orders of the defined types were placed in this encounter.   Follow-up: Return in about 6 months (around 08/25/2020), or if symptoms worsen or fail to improve.    Libby Maw, MD

## 2020-02-23 NOTE — Patient Instructions (Signed)
   Managing Your Hypertension Hypertension is commonly called high blood pressure. This is when the force of your blood pressing against the walls of your arteries is too strong. Arteries are blood vessels that carry blood from your heart throughout your body. Hypertension forces the heart to work harder to pump blood, and may cause the arteries to become narrow or stiff. Having untreated or uncontrolled hypertension can cause heart attack, stroke, kidney disease, and other problems. What are blood pressure readings? A blood pressure reading consists of a higher number over a lower number. Ideally, your blood pressure should be below 120/80. The first ("top") number is called the systolic pressure. It is a measure of the pressure in your arteries as your heart beats. The second ("bottom") number is called the diastolic pressure. It is a measure of the pressure in your arteries as the heart relaxes. What does my blood pressure reading mean? Blood pressure is classified into four stages. Based on your blood pressure reading, your health care provider may use the following stages to determine what type of treatment you need, if any. Systolic pressure and diastolic pressure are measured in a unit called mm Hg. Normal  Systolic pressure: below 120.  Diastolic pressure: below 80. Elevated  Systolic pressure: 120-129.  Diastolic pressure: below 80. Hypertension stage 1  Systolic pressure: 130-139.  Diastolic pressure: 80-89. Hypertension stage 2  Systolic pressure: 140 or above.  Diastolic pressure: 90 or above. What health risks are associated with hypertension? Managing your hypertension is an important responsibility. Uncontrolled hypertension can lead to:  A heart attack.  A stroke.  A weakened blood vessel (aneurysm).  Heart failure.  Kidney damage.  Eye damage.  Metabolic syndrome.  Memory and concentration problems. What changes can I make to manage my  hypertension? Hypertension can be managed by making lifestyle changes and possibly by taking medicines. Your health care provider will help you make a plan to bring your blood pressure within a normal range. Eating and drinking   Eat a diet that is high in fiber and potassium, and low in salt (sodium), added sugar, and fat. An example eating plan is called the DASH (Dietary Approaches to Stop Hypertension) diet. To eat this way: ? Eat plenty of fresh fruits and vegetables. Try to fill half of your plate at each meal with fruits and vegetables. ? Eat whole grains, such as whole wheat pasta, brown rice, or whole grain bread. Fill about one quarter of your plate with whole grains. ? Eat low-fat diary products. ? Avoid fatty cuts of meat, processed or cured meats, and poultry with skin. Fill about one quarter of your plate with lean proteins such as fish, chicken without skin, beans, eggs, and tofu. ? Avoid premade and processed foods. These tend to be higher in sodium, added sugar, and fat.  Reduce your daily sodium intake. Most people with hypertension should eat less than 1,500 mg of sodium a day.  Limit alcohol intake to no more than 1 drink a day for nonpregnant women and 2 drinks a day for men. One drink equals 12 oz of beer, 5 oz of wine, or 1 oz of hard liquor. Lifestyle  Work with your health care provider to maintain a healthy body weight, or to lose weight. Ask what an ideal weight is for you.  Get at least 30 minutes of exercise that causes your heart to beat faster (aerobic exercise) most days of the week. Activities may include walking, swimming, or biking.    Include exercise to strengthen your muscles (resistance exercise), such as weight lifting, as part of your weekly exercise routine. Try to do these types of exercises for 30 minutes at least 3 days a week.  Do not use any products that contain nicotine or tobacco, such as cigarettes and e-cigarettes. If you need help quitting,  ask your health care provider.  Control any long-term (chronic) conditions you have, such as high cholesterol or diabetes. Monitoring  Monitor your blood pressure at home as told by your health care provider. Your personal target blood pressure may vary depending on your medical conditions, your age, and other factors.  Have your blood pressure checked regularly, as often as told by your health care provider. Working with your health care provider  Review all the medicines you take with your health care provider because there may be side effects or interactions.  Talk with your health care provider about your diet, exercise habits, and other lifestyle factors that may be contributing to hypertension.  Visit your health care provider regularly. Your health care provider can help you create and adjust your plan for managing hypertension. Will I need medicine to control my blood pressure? Your health care provider may prescribe medicine if lifestyle changes are not enough to get your blood pressure under control, and if:  Your systolic blood pressure is 130 or higher.  Your diastolic blood pressure is 80 or higher. Take medicines only as told by your health care provider. Follow the directions carefully. Blood pressure medicines must be taken as prescribed. The medicine does not work as well when you skip doses. Skipping doses also puts you at risk for problems. Contact a health care provider if:  You think you are having a reaction to medicines you have taken.  You have repeated (recurrent) headaches.  You feel dizzy.  You have swelling in your ankles.  You have trouble with your vision. Get help right away if:  You develop a severe headache or confusion.  You have unusual weakness or numbness, or you feel faint.  You have severe pain in your chest or abdomen.  You vomit repeatedly.  You have trouble breathing. Summary  Hypertension is when the force of blood pumping  through your arteries is too strong. If this condition is not controlled, it may put you at risk for serious complications.  Your personal target blood pressure may vary depending on your medical conditions, your age, and other factors. For most people, a normal blood pressure is less than 120/80.  Hypertension is managed by lifestyle changes, medicines, or both. Lifestyle changes include weight loss, eating a healthy, low-sodium diet, exercising more, and limiting alcohol. This information is not intended to replace advice given to you by your health care provider. Make sure you discuss any questions you have with your health care provider. Document Revised: 01/06/2019 Document Reviewed: 08/12/2016 Elsevier Patient Education  2020 [employer]  

## 2020-02-24 LAB — CBC
HCT: 38.7 % (ref 35.0–45.0)
Hemoglobin: 12.6 g/dL (ref 11.7–15.5)
MCH: 29.6 pg (ref 27.0–33.0)
MCHC: 32.6 g/dL (ref 32.0–36.0)
MCV: 91.1 fL (ref 80.0–100.0)
MPV: 10.7 fL (ref 7.5–12.5)
Platelets: 367 10*3/uL (ref 140–400)
RBC: 4.25 10*6/uL (ref 3.80–5.10)
RDW: 13.6 % (ref 11.0–15.0)
WBC: 6.1 10*3/uL (ref 3.8–10.8)

## 2020-02-24 LAB — BASIC METABOLIC PANEL
BUN: 15 mg/dL (ref 7–25)
CO2: 23 mmol/L (ref 20–32)
Calcium: 9.5 mg/dL (ref 8.6–10.4)
Chloride: 104 mmol/L (ref 98–110)
Creat: 0.69 mg/dL (ref 0.50–1.05)
Glucose, Bld: 65 mg/dL (ref 65–99)
Potassium: 4.5 mmol/L (ref 3.5–5.3)
Sodium: 140 mmol/L (ref 135–146)

## 2020-02-24 LAB — SPECIMEN COMPROMISED

## 2020-03-15 ENCOUNTER — Ambulatory Visit: Payer: BC Managed Care – PPO | Admitting: Podiatry

## 2020-03-24 ENCOUNTER — Other Ambulatory Visit: Payer: Self-pay | Admitting: Family Medicine

## 2020-03-24 DIAGNOSIS — I10 Essential (primary) hypertension: Secondary | ICD-10-CM

## 2020-03-29 ENCOUNTER — Ambulatory Visit: Payer: BC Managed Care – PPO | Admitting: Podiatry

## 2020-04-02 ENCOUNTER — Ambulatory Visit (INDEPENDENT_AMBULATORY_CARE_PROVIDER_SITE_OTHER): Payer: BC Managed Care – PPO | Admitting: Orthopaedic Surgery

## 2020-04-02 ENCOUNTER — Ambulatory Visit: Payer: Self-pay

## 2020-04-02 DIAGNOSIS — Z96652 Presence of left artificial knee joint: Secondary | ICD-10-CM | POA: Diagnosis not present

## 2020-04-02 NOTE — Progress Notes (Signed)
   Office Visit Note   Patient: Hailey [name]           Date of Birth: Feb 09, 1961           MRN: [id number] Visit Date: 04/02/2020              Requested by: Mliss Sax, MD [address] [place],  [place] [address] PCP: Mliss Sax, MD   Assessment & Plan: Visit Diagnoses:  1. Status post total left knee replacement     Plan: Hailey [name] is doing very well for her 55-month visit.  Her x-rays are unremarkable.  She has returned back to normal activities.  At this point we will see her back in another 6 months with two-view x-rays of the left knee.  Questions encouraged and answered.  Follow-Up Instructions: Return in about 6 months (around 10/03/2020).   Orders:  Orders Placed This Encounter  Procedures  . XR Knee 1-2 Views Left   No orders of the defined types were placed in this encounter.     Procedures: No procedures performed   Clinical Data: No additional findings.   Subjective: Chief Complaint  Patient presents with  . Left Knee - Routine Post Op    Patient is 6 months status post left total knee replacement.  She is overall doing well and has returned back to normal activities.  She is very happy with her recovery.  No complaints.   Review of Systems   Objective: Vital Signs: LMP 03/14/2014 (Approximate) Comment: perimenopause  Physical Exam  Ortho Exam Left knee shows a fully healed surgical scar.  She has returned back to her baseline range of motion. Specialty Comments:  No specialty comments available.  Imaging: XR Knee 1-2 Views Left  Result Date: 04/02/2020 Stable total knee replacement in good alignment.     PMFS History: Patient Active Problem List   Diagnosis Date Noted  . Fire ant bite 02/23/2020  . Status post total left knee replacement 10/16/2019  . Primary osteoarthritis of left knee 09/06/2019  . Health care maintenance 02/24/2019  . Chronic sinusitis 10/04/2018  . CARPAL TUNNEL SYNDROME,  BILATERAL 08/04/2010  . GERD 03/06/2008  . Class 2 obesity due to excess calories with body mass index (BMI) of 38.0 to 38.9 in adult 02/10/2007  . Essential hypertension 02/10/2007   Past Medical History:  Diagnosis Date  . Arthritis   . CARPAL TUNNEL SYNDROME, BILATERAL 08/04/2010  . GERD 03/06/2008  . HYPERTENSION 02/10/2007  . Obesity   . OBESITY NOS 02/10/2007    No family history on file.  Past Surgical History:  Procedure Laterality Date  . HEMORRHOID SURGERY    . NASAL SINUS SURGERY    . TOTAL KNEE ARTHROPLASTY Left 10/16/2019   Procedure: LEFT TOTAL KNEE ARTHROPLASTY;  Surgeon: Tarry Kos, MD;  Location: [facility] OR;  Service: Orthopedics;  Laterality: Left;   Social History   Occupational History  . Not on file  Tobacco Use  . Smoking status: Never Smoker  . Smokeless tobacco: Never Used  Substance and Sexual Activity  . Alcohol use: Yes    Comment: rarley  . Drug use: No  . Sexual activity: Not on file

## 2020-04-14 ENCOUNTER — Other Ambulatory Visit: Payer: Self-pay | Admitting: Family Medicine

## 2020-04-14 DIAGNOSIS — I10 Essential (primary) hypertension: Secondary | ICD-10-CM

## 2020-04-15 MED ORDER — BENAZEPRIL HCL 20 MG PO TABS: 20.0000 mg | ORAL_TABLET | Freq: Every day | ORAL | 0 refills | Status: DC

## 2020-04-15 NOTE — Telephone Encounter (Signed)
Last oV 02/23/20 Last fill 03/25/20  #90/0

## 2020-04-19 ENCOUNTER — Encounter: Payer: Self-pay | Admitting: Orthopaedic Surgery

## 2020-04-19 ENCOUNTER — Ambulatory Visit: Payer: Self-pay

## 2020-04-19 ENCOUNTER — Ambulatory Visit (INDEPENDENT_AMBULATORY_CARE_PROVIDER_SITE_OTHER): Payer: BC Managed Care – PPO | Admitting: Orthopaedic Surgery

## 2020-04-19 VITALS — Ht 65.5 in | Wt 236.0 lb

## 2020-04-19 DIAGNOSIS — M25561 Pain in right knee: Secondary | ICD-10-CM

## 2020-04-19 DIAGNOSIS — G8929 Other chronic pain: Secondary | ICD-10-CM

## 2020-04-19 MED ORDER — BUPIVACAINE HCL 0.5 % IJ SOLN
2.0000 mL | INTRAMUSCULAR | Status: AC | PRN
  Administered 2020-04-19: 2 mL via INTRA_ARTICULAR

## 2020-04-19 MED ORDER — METHYLPREDNISOLONE ACETATE 40 MG/ML IJ SUSP
40.0000 mg | INTRAMUSCULAR | Status: AC | PRN
  Administered 2020-04-19: 40 mg via INTRA_ARTICULAR

## 2020-04-19 MED ORDER — LIDOCAINE HCL 1 % IJ SOLN
2.0000 mL | INTRAMUSCULAR | Status: AC | PRN
  Administered 2020-04-19: 2 mL

## 2020-04-19 NOTE — Progress Notes (Signed)
   Office Visit Note   Patient: Hailey [name]           Date of Birth: 01-06-1961           MRN: [id number] Visit Date: 04/19/2020              Requested by: Mliss Sax, MD [address] [place],  [place] [address] PCP: Mliss Sax, MD   Assessment & Plan: Visit Diagnoses:  1. Chronic pain of right knee     Plan: Impression is right knee DJD exacerbation.  Cortisone injected today.  Patient tolerated this well.  Follow-up as needed.  Follow-Up Instructions: Return if symptoms worsen or fail to improve.   Orders:  No orders of the defined types were placed in this encounter.  No orders of the defined types were placed in this encounter.     Procedures: Large Joint Inj: R knee on 04/19/2020 10:17 PM Indications: pain Details: 22 G needle  Arthrogram: No  Medications: 40 mg methylPREDNISolone acetate 40 MG/ML; 2 mL lidocaine 1 %; 2 mL bupivacaine 0.5 % Consent was given by the patient. Patient was prepped and draped in the usual sterile fashion.       Clinical Data: No additional findings.   Subjective: Chief Complaint  Patient presents with  . Right Knee - Pain    [name] comes in today for worsening pain in her right knee.  She has known DJD.  She had a cortisone injection in the right knee about 9 months ago and would like another one today.  No changes otherwise.   Review of Systems   Objective: Vital Signs: Ht 5' 5.5" (1.664 m)   Wt (!) 236 lb (107 kg)   LMP 03/14/2014 (Approximate) Comment: perimenopause  BMI 38.68 kg/m   Physical Exam  Ortho Exam Right knee shows no joint effusion.  Painful range of motion with 2+ crepitus Specialty Comments:  No specialty comments available.  Imaging: No results found.   PMFS History: Patient Active Problem List   Diagnosis Date Noted  . Fire ant bite 02/23/2020  . Status post total left knee replacement 10/16/2019  . Primary osteoarthritis of left knee 09/06/2019    . Health care maintenance 02/24/2019  . Chronic sinusitis 10/04/2018  . CARPAL TUNNEL SYNDROME, BILATERAL 08/04/2010  . GERD 03/06/2008  . Class 2 obesity due to excess calories with body mass index (BMI) of 38.0 to 38.9 in adult 02/10/2007  . Essential hypertension 02/10/2007   Past Medical History:  Diagnosis Date  . Arthritis   . CARPAL TUNNEL SYNDROME, BILATERAL 08/04/2010  . GERD 03/06/2008  . HYPERTENSION 02/10/2007  . Obesity   . OBESITY NOS 02/10/2007    History reviewed. No pertinent family history.  Past Surgical History:  Procedure Laterality Date  . HEMORRHOID SURGERY    . NASAL SINUS SURGERY    . TOTAL KNEE ARTHROPLASTY Left 10/16/2019   Procedure: LEFT TOTAL KNEE ARTHROPLASTY;  Surgeon: Tarry Kos, MD;  Location: [facility] OR;  Service: Orthopedics;  Laterality: Left;   Social History   Occupational History  . Not on file  Tobacco Use  . Smoking status: Never Smoker  . Smokeless tobacco: Never Used  Substance and Sexual Activity  . Alcohol use: Yes    Comment: rarley  . Drug use: No  . Sexual activity: Not on file

## 2020-05-30 ENCOUNTER — Ambulatory Visit (INDEPENDENT_AMBULATORY_CARE_PROVIDER_SITE_OTHER): Payer: BC Managed Care – PPO | Admitting: Family Medicine

## 2020-05-30 ENCOUNTER — Other Ambulatory Visit: Payer: Self-pay

## 2020-05-30 ENCOUNTER — Encounter (INDEPENDENT_AMBULATORY_CARE_PROVIDER_SITE_OTHER): Payer: Self-pay | Admitting: Family Medicine

## 2020-05-30 VITALS — BP 130/81 | HR 57 | Temp 98.1°F | Ht 65.0 in | Wt 234.0 lb

## 2020-05-30 DIAGNOSIS — M1712 Unilateral primary osteoarthritis, left knee: Secondary | ICD-10-CM

## 2020-05-30 DIAGNOSIS — E559 Vitamin D deficiency, unspecified: Secondary | ICD-10-CM | POA: Insufficient documentation

## 2020-05-30 DIAGNOSIS — Z1331 Encounter for screening for depression: Secondary | ICD-10-CM

## 2020-05-30 DIAGNOSIS — Z9189 Other specified personal risk factors, not elsewhere classified: Secondary | ICD-10-CM

## 2020-05-30 DIAGNOSIS — Z6838 Body mass index (BMI) 38.0-38.9, adult: Secondary | ICD-10-CM

## 2020-05-30 DIAGNOSIS — E538 Deficiency of other specified B group vitamins: Secondary | ICD-10-CM

## 2020-05-30 DIAGNOSIS — D649 Anemia, unspecified: Secondary | ICD-10-CM | POA: Diagnosis not present

## 2020-05-30 DIAGNOSIS — I10 Essential (primary) hypertension: Secondary | ICD-10-CM

## 2020-05-30 DIAGNOSIS — R0602 Shortness of breath: Secondary | ICD-10-CM | POA: Diagnosis not present

## 2020-05-30 DIAGNOSIS — Z0289 Encounter for other administrative examinations: Secondary | ICD-10-CM

## 2020-05-30 DIAGNOSIS — R5383 Other fatigue: Secondary | ICD-10-CM | POA: Diagnosis not present

## 2020-05-30 NOTE — Progress Notes (Addendum)
Chief Complaint:    This is the patient's first visit at [facility].  The patient's NEW PATIENT PACKET was reviewed at length. Included in the packet: current and past health history, medications, allergies, ROS, gynecologic history (women only), surgical history, family history, social history, weight history, weight loss surgery history (for those that have had weight loss surgery), nutritional evaluation, mood and food questionnaire along with a depression screening (PHQ9) on all patients, an Epworth questionnaire, sleep habits questionnaire, patient life and health improvement goals questionnaire. These will all be scanned into the patient's chart under media.   During the visit, I independently reviewed the patient's EKG, bioimpedance scale results, and indirect calorimeter results. I used this information to tailor a meal plan for the patient that will help Hailey [name] to lose weight and will improve her obesity-related conditions going forward. I performed a medically necessary appropriate examination and/or evaluation. I discussed the assessment and treatment plan with the patient. The patient was provided an opportunity to ask questions and all were answered. The patient agreed with the plan and demonstrated an understanding of the instructions. Labs were ordered at this visit and will be reviewed at the next visit unless more critical results need to be addressed immediately. Clinical information was updated and documented in the EMR.   Time spent on visit including pre-visit chart review and post-visit care was estimated to be 60-74 minutes.  A separate 15 minutes was spent on risk counseling (see above).     OBESITY Hailey [name] (MR# [id number]) is a 59 y.o. female who presents for evaluation and treatment of obesity and related comorbidities. Current BMI is Body mass index is 38.94 kg/m. Hailey [name] has been struggling with her weight for many years and has been  unsuccessful in either losing weight, maintaining weight loss, or reaching her healthy weight goal.  Hailey [name] is currently in the action stage of change and ready to dedicate time achieving and maintaining a healthier weight. Hailey [name] is interested in becoming our patient and working on intensive lifestyle modifications including (but not limited to) diet and exercise for weight loss.  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  She lives with her husband, Alinda Money.  She wants to lose weight to protect her knees from arthritis that runs rampant in her family.   Many siblings and other family members have it as well as replacements, etc., and she wants to do all she can.   - She had left TKA in 09/2019.  Prior to that, she was very active, even running a 10k and 1/2 marathons, but she is not active now.  Hailey [name]'s habits were reviewed today and are as follows: Her family eats meals together, she thinks her family will eat healthier with her, her desired weight loss is 35 pounds, she started gaining weight 2006-2008, her heaviest weight ever was 259 pounds, she craves fried okra, cheeses, guacamole, salsa, tortilla chips, shellfish, sushi (no raw fish), she frequently makes poor food choices, she frequently eats larger portions than normal and she struggles with emotional eating.  Depression Screen Benita's Food and Mood (modified PHQ-9) score was 1.  Depression screen PHQ 2/9 05/30/2020  Decreased Interest 1  Down, Depressed, Hopeless 1  PHQ - 2 Score 2  Altered sleeping 1  Tired, decreased energy 2  Change in appetite 1  Feeling bad or failure about yourself  0  Trouble concentrating 0  Moving slowly or fidgety/restless 0  PHQ-9 Score 6   Subjective:  1. Other fatigue Yannis denies daytime somnolence and denies waking up still tired. Patent has a history of symptoms of sometimes snoring. Hailey [name] generally gets 6 or 7 hours of sleep per night, and states that she has generally restful sleep. Snoring is not  present. Apneic episodes are not present. Epworth Sleepiness Score is 6.  She is extremely active.  She gardens at her church and is working full time to keep meals at work, Catering manager.  She never sits around.  However, she feels her increased weight is causing her to feel more fatigued/tired than she otherwise should.  Denies cardiopulmonary symptoms.  2. Shortness of breath on exertion Hailey [name] notes increasing shortness of breath with exercising and seems to be worsening over time with weight gain. She notes getting out of breath sooner with activity than she used to. This has gotten worse recently. Meka denies shortness of breath at rest or orthopnea.  She feels that if she lost weight, she would be less short of breath doing activities.  The more weight she puts on, the more difficult/challenging activities can become.  3. Essential hypertension Review: taking medications as instructed, no medication side effects noted, no chest pain on exertion, no dyspnea on exertion, no swelling of ankles.  Blood pressure is at goal on benazepril 20 mg daily.     BP Readings from Last 3 Encounters:  05/30/20 130/81  02/23/20 120/80  10/16/19 116/72   4. Anemia, unspecified type Hailey [name] is not a vegetarian.  She does not have a history of weight loss surgery.  She endorses fatigue and shortness of breath.  CBC Latest Ref Rng & Units 05/30/2020 02/23/2020 10/12/2019  WBC 3.4 - 10.8 x10E3/uL 4.5 6.1 5.0  Hemoglobin 11.1 - 15.9 g/dL 63.0 16.0 10.9  Hematocrit 34.0 - 46.6 % 40.6 38.7 40.1  Platelets 150 - 450 x10E3/uL 335 367 343   5. Vitamin D deficiency Hanin's Vitamin D level was 41.95 on 5/292020. She is currently taking OTC vitamin D 1000 IU each day. She denies nausea, vomiting or muscle weakness.  She says she is unsure of her daily OTC dose.  She wrote 50 mg, but 25 mg is documented in her chart.  6. B12 nutritional deficiency She is not a vegetarian.  She does not have a previous diagnosis of pernicious  anemia.  She does not have a history of weight loss surgery.  She is taking 1000 mcg daily.  History of nutritional deficiency.  7. Primary osteoarthritis of left knee Status post left TKA in 09/2019.    8. Depression screening Hailey [name] was screened for depression as part of her new patient workup.  PHQ-9 is 1.  9. At risk for activity intolerance Hailey [name] is at risk for exercise intolerance due to currently recovering from left TKA surgery and is unable to "exercise".  Counseling done regarding increasing muscle mass to increase metabolism.  Assessment/Plan:     1. Other fatigue Hailey [name] does feel that her weight is causing her energy to be lower than it should be. Fatigue may be related to obesity, depression or many other causes. Labs will be ordered, and in the meanwhile, Hailey [name] will focus on self care including making healthy food choices, increasing physical activity and focusing on stress reduction.  Check labs.  Check IC.  Discussed with her that this is likely due to her weight, but we will rule out other organic reasons.  - CBC with Differential/Platelet - Comprehensive metabolic panel - Hemoglobin A1c - Insulin, random - T3 -  T4, free - TSH - Folate  2. Shortness of breath on exertion Hailey [name] does feel that she gets out of breath more easily that she used to when she exercises. Hailey [name]'s shortness of breath appears to be obesity related and exercise induced. She has agreed to work on weight loss and gradually increase exercise to treat her exercise induced shortness of breath. Will continue to monitor closely.  Check labs.  Check IC. She declines ECG as it was just done in 09/2019 as prep for surgery.  - Vitamin B12 - CBC with Differential/Platelet - Lipid Panel With LDL/HDL Ratio - T3 - T4, free - TSH  3. Essential hypertension Hailey [name] is working on healthy weight loss and exercise to improve blood pressure control. We will watch for signs of hypotension as she continues her lifestyle  modifications.  Check labs.  Stable at goal.  4. Anemia, unspecified type Check labs.  Orders and follow up as documented in patient record.  Counseling . Iron is essential for our bodies to make red blood cells.  Reasons that someone may be deficient include: an iron-deficient diet (more likely in those following vegan or vegetarian diets), women with heavy menses, patients with GI disorders or poor absorption, patients that have had bariatric surgery, frequent blood donors, patients with cancer, and patients with heart disease.   Gaspar Cola foods include dark leafy greens, red and white meats, eggs, seafood, and beans.   . Certain foods and drinks prevent your body from absorbing iron properly. Avoid eating these foods in the same meal as iron-rich foods or with iron supplements. These foods include: coffee, black tea, and red wine; milk, dairy products, and foods that are high in calcium; beans and soybeans; whole grains.  . Constipation can be a side effect of iron supplementation. Increased water and fiber intake are helpful. Water goal: > 2 liters/day. Fiber goal: > 25 grams/day.  - Vitamin B12 - CBC with Differential/Platelet - Folate  5. Vitamin D deficiency Low Vitamin D level contributes to fatigue and are associated with obesity, breast, and colon cancer.  Check vitamin D level today.  - VITAMIN D 25 Hydroxy (Vit-D Deficiency, Fractures)  6. B12 nutritional deficiency The diagnosis was reviewed with the patient. Counseling provided today, see below. We will continue to monitor. Orders and follow up as documented in patient record.  Check labs.  Counseling . The body needs vitamin B12: to make red blood cells; to make DNA; and to help the nerves work properly so they can carry messages from the brain to the body.  . The main causes of vitamin B12 deficiency include dietary deficiency, digestive diseases, pernicious anemia, and having a surgery in which part of the stomach or small  intestine is removed.  . Certain medicines can make it harder for the body to absorb vitamin B12. These medicines include: heartburn medications; some antibiotics; some medications used to treat diabetes, gout, and high cholesterol.  . In some cases, there are no symptoms of this condition. If the condition leads to anemia or nerve damage, various symptoms can occur, such as weakness or fatigue, shortness of breath, and numbness or tingling in your hands and feet.   . Treatment:  o May include taking vitamin B12 supplements.  o Avoid alcohol.  o Eat lots of healthy foods that contain vitamin B12: - Beef, pork, chicken, [place], and organ meats, such as liver.  - Seafood: This includes clams, rainbow trout, salmon, tuna, and haddock. Eggs.  - Cereal and  dairy products that are fortified: This means that vitamin B12 has been added to the food.   - Vitamin B12  7. Primary osteoarthritis of left knee Treatment plan per Ortho and patient's PT team.  Increase activity as tolerated and allowed by specialists.  8. Depression screening Depression screening was negative.  9. At risk for activity intolerance Hailey [name] was given approximately 10+ minutes of exercise intolerance counseling today.  She is 59 y.o. female and has risk factors exercise intolerance including obesity, OA and recent L TKA in Jan 2021.  We discussed intensive lifestyle modifications today with an emphasis on specific weight loss instructions and strategies. Hailey [name] will slowly increase activity as tolerated and directed by her orthopedist/ PT team.   Evidence-based interventions for health behavior change were utilized today including the discussion of self monitoring techniques, problem-solving barriers and SMART goal setting techniques.   Regarding patient's less desirable eating habits and patterns, we employed the technique of small changes.    10. Class 2 severe obesity due to excess calories with serious comorbidity and body  mass index (BMI) of 38.0 to 38.9 in adult [facility]) Hailey [name] is currently in the action stage of change and her goal is to continue with weight loss efforts. I recommend Geryl begin the structured treatment plan as follows:  She has agreed to the Category 1 Plan with much reservation.    Despite [place] reviewing her estimated BMR and ACTUAL BMR, pt states that she needs to eat much more food than what is prescribed to her on plan. - Extensive education done and extra time spent answering pt's Q/concerns regarding our methods of measurement, her metabolism, ways to improve it, why it may be so low etc etc.  Nutritional needs discussed with pt in detail.   - Reminded pt of the time limitations of our office visits, and that in future, we will see each other every 2 wks, affording her ample opportunity to have her concerns addressed on a frequent basis  Exercise goals: As is.   Behavioral modification strategies: no skipping meals, meal planning and cooking strategies and planning for success.  She was informed of the importance of frequent follow-up visits to maximize her success with intensive lifestyle modifications for her multiple health conditions. She was informed we would discuss her lab results at her next visit unless there is a critical issue that needs to be addressed sooner. Hailey [name] agreed to keep her next visit at the agreed upon time to discuss these results.  Objective:   Blood pressure 130/81, pulse (!) 57, temperature 98.1 F (36.7 C), height 5\' 5"  (1.651 m), weight 234 lb (106.1 kg), last menstrual period 03/14/2014, SpO2 98 %. Body mass index is 38.94 kg/m.  Indirect Calorimeter completed today shows a VO2 of 155 and a REE of 1074.  Her calculated basal metabolic rate is 03/16/2014 thus her basal metabolic rate is worse than expected.  General: Cooperative, alert, well developed, in no acute distress. HEENT: Conjunctivae and lids unremarkable. Cardiovascular: Regular rhythm.  Lungs: Normal  work of breathing. Neurologic: No focal deficits.   Lab Results  Component Value Date   CREATININE 0.60 05/30/2020   BUN 14 05/30/2020   NA 142 05/30/2020   K 4.5 05/30/2020   CL 105 05/30/2020   CO2 24 05/30/2020   Lab Results  Component Value Date   ALT 13 05/30/2020   AST 15 05/30/2020   ALKPHOS 103 05/30/2020   BILITOT 0.4 05/30/2020   Lab Results  Component Value  Date   TSH 1.290 05/30/2020   Lab Results  Component Value Date   CHOL 178 05/30/2020   HDL 67 05/30/2020   LDLCALC 103 (H) 05/30/2020   LDLDIRECT 77.0 02/24/2019   TRIG 39 05/30/2020   CHOLHDL 2 02/24/2019   Lab Results  Component Value Date   WBC 4.5 05/30/2020   HGB 13.0 05/30/2020   HCT 40.6 05/30/2020   MCV 93 05/30/2020   PLT 335 05/30/2020   Attestation Statements:   Reviewed by clinician on day of visit: allergies, medications, problem list, medical history, surgical history, family history, social history, and previous encounter notes.  I, Insurance claims handlerAmber Agner, CMA, am acting as Energy managertranscriptionist for Marsh & McLennanDeborah Kile Kabler, DO.  I have reviewed the above documentation for accuracy and completeness, and I agree with the above. Thomasene Lot-  Casimer Russett, DO

## 2020-05-31 LAB — COMPREHENSIVE METABOLIC PANEL
ALT: 13 IU/L (ref 0–32)
AST: 15 IU/L (ref 0–40)
Albumin/Globulin Ratio: 1.9 (ref 1.2–2.2)
Albumin: 4.5 g/dL (ref 3.8–4.9)
Alkaline Phosphatase: 103 IU/L (ref 48–121)
BUN/Creatinine Ratio: 23 (ref 9–23)
BUN: 14 mg/dL (ref 6–24)
Bilirubin Total: 0.4 mg/dL (ref 0.0–1.2)
CO2: 24 mmol/L (ref 20–29)
Calcium: 9.2 mg/dL (ref 8.7–10.2)
Chloride: 105 mmol/L (ref 96–106)
Creatinine, Ser: 0.6 mg/dL (ref 0.57–1.00)
GFR calc Af Amer: 115 mL/min/{1.73_m2} (ref >59)
GFR calc non Af Amer: 100 mL/min/{1.73_m2} (ref >59)
Globulin, Total: 2.4 g/dL (ref 1.5–4.5)
Glucose: 84 mg/dL (ref 65–99)
Potassium: 4.5 mmol/L (ref 3.5–5.2)
Sodium: 142 mmol/L (ref 134–144)
Total Protein: 6.9 g/dL (ref 6.0–8.5)

## 2020-05-31 LAB — INSULIN, RANDOM: INSULIN: 5.5 u[IU]/mL (ref 2.6–24.9)

## 2020-05-31 LAB — CBC WITH DIFFERENTIAL/PLATELET
Basophils Absolute: 0.1 10*3/uL (ref 0.0–0.2)
Basos: 1 %
EOS (ABSOLUTE): 0.1 10*3/uL (ref 0.0–0.4)
Eos: 3 %
Hematocrit: 40.6 % (ref 34.0–46.6)
Hemoglobin: 13 g/dL (ref 11.1–15.9)
Immature Grans (Abs): 0 10*3/uL (ref 0.0–0.1)
Immature Granulocytes: 0 %
Lymphocytes Absolute: 1.1 10*3/uL (ref 0.7–3.1)
Lymphs: 25 %
MCH: 29.9 pg (ref 26.6–33.0)
MCHC: 32 g/dL (ref 31.5–35.7)
MCV: 93 fL (ref 79–97)
Monocytes Absolute: 0.4 10*3/uL (ref 0.1–0.9)
Monocytes: 9 %
Neutrophils Absolute: 2.8 10*3/uL (ref 1.4–7.0)
Neutrophils: 62 %
Platelets: 335 10*3/uL (ref 150–450)
RBC: 4.35 x10E6/uL (ref 3.77–5.28)
RDW: 14.1 % (ref 11.7–15.4)
WBC: 4.5 10*3/uL (ref 3.4–10.8)

## 2020-05-31 LAB — VITAMIN B12: Vitamin B-12: >2000 pg/mL — ABNORMAL HIGH (ref 232–1245)

## 2020-05-31 LAB — HEMOGLOBIN A1C
Est. average glucose Bld gHb Est-mCnc: 105 mg/dL
Hgb A1c MFr Bld: 5.3 % (ref 4.8–5.6)

## 2020-05-31 LAB — T4, FREE: Free T4: 1.2 ng/dL (ref 0.82–1.77)

## 2020-05-31 LAB — FOLATE: Folate: >20 ng/mL (ref >3.0)

## 2020-05-31 LAB — LIPID PANEL WITH LDL/HDL RATIO
Cholesterol, Total: 178 mg/dL (ref 100–199)
HDL: 67 mg/dL (ref >39)
LDL Chol Calc (NIH): 103 mg/dL — ABNORMAL HIGH (ref 0–99)
LDL/HDL Ratio: 1.5 ratio (ref 0.0–3.2)
Triglycerides: 39 mg/dL (ref 0–149)
VLDL Cholesterol Cal: 8 mg/dL (ref 5–40)

## 2020-05-31 LAB — VITAMIN D 25 HYDROXY (VIT D DEFICIENCY, FRACTURES): Vit D, 25-Hydroxy: 36.4 ng/mL (ref 30.0–100.0)

## 2020-05-31 LAB — TSH: TSH: 1.29 u[IU]/mL (ref 0.450–4.500)

## 2020-05-31 LAB — T3: T3, Total: 111 ng/dL (ref 71–180)

## 2020-06-13 ENCOUNTER — Ambulatory Visit (INDEPENDENT_AMBULATORY_CARE_PROVIDER_SITE_OTHER): Payer: BC Managed Care – PPO | Admitting: Family Medicine

## 2020-06-13 ENCOUNTER — Other Ambulatory Visit: Payer: Self-pay

## 2020-06-13 ENCOUNTER — Encounter (INDEPENDENT_AMBULATORY_CARE_PROVIDER_SITE_OTHER): Payer: Self-pay | Admitting: Family Medicine

## 2020-06-13 VITALS — BP 114/76 | HR 71 | Temp 98.1°F | Ht 65.0 in | Wt 230.0 lb

## 2020-06-13 DIAGNOSIS — E8881 Metabolic syndrome: Secondary | ICD-10-CM

## 2020-06-13 DIAGNOSIS — I10 Essential (primary) hypertension: Secondary | ICD-10-CM

## 2020-06-13 DIAGNOSIS — E538 Deficiency of other specified B group vitamins: Secondary | ICD-10-CM

## 2020-06-13 DIAGNOSIS — Z9189 Other specified personal risk factors, not elsewhere classified: Secondary | ICD-10-CM

## 2020-06-13 DIAGNOSIS — E7849 Other hyperlipidemia: Secondary | ICD-10-CM

## 2020-06-13 DIAGNOSIS — E559 Vitamin D deficiency, unspecified: Secondary | ICD-10-CM

## 2020-06-13 DIAGNOSIS — Z6838 Body mass index (BMI) 38.0-38.9, adult: Secondary | ICD-10-CM

## 2020-06-13 MED ORDER — VITAMIN D (ERGOCALCIFEROL) 1.25 MG (50000 UNIT) PO CAPS: 50000.0000 [IU] | ORAL_CAPSULE | ORAL | 0 refills | Status: DC

## 2020-06-13 NOTE — Patient Instructions (Signed)
The 10-year ASCVD risk score Denman George DC Montez Hageman., et al., 2013) is: 3.6%   Values used to calculate the score:     Age: 59 years     Sex: Female     Is Non-Hispanic African American: Yes     Diabetic: No     Tobacco smoker: No     Systolic Blood Pressure: 114 mmHg     Is BP treated: Yes     HDL Cholesterol: 67 mg/dL     Total Cholesterol: 178 mg/dL

## 2020-06-17 ENCOUNTER — Other Ambulatory Visit: Payer: Self-pay

## 2020-06-17 ENCOUNTER — Ambulatory Visit: Payer: BC Managed Care – PPO | Admitting: Orthotics

## 2020-06-17 DIAGNOSIS — M2141 Flat foot [pes planus] (acquired), right foot: Secondary | ICD-10-CM

## 2020-06-17 DIAGNOSIS — M2142 Flat foot [pes planus] (acquired), left foot: Secondary | ICD-10-CM

## 2020-06-17 NOTE — Progress Notes (Signed)
Took material off bottom of f/o to keep 5 hammertoe from rubbing toe box.

## 2020-06-18 NOTE — Progress Notes (Signed)
Chief Complaint:   OBESITY Hailey [name] is here to discuss her progress with her obesity treatment plan along with follow-up of her obesity related diagnoses. Hailey [name] is on the Category 1 Plan and states she is following her eating plan approximately 60-70% of the time. Hailey [name] states she is using the elliptical, walking, and gardening for 60 minutes 2-3 times per week.  Today's visit was #: 2 Starting weight: 234 lbs Starting date: 05/30/2020 Today's weight: 230 lbs Today's date: 06/13/2020 Total lbs lost to date: 4 lbs Total lbs lost since last in-office visit: 4 lbs  Interim History: Hailey [name] has been calculating her caloric intake on her own everyday since last office visit.  She did not measure or weigh proteins.  She makes a sandwich with 1 slice of cheese, 1 slice of ham, and 1 slice of [place] on the [employer].  She snacks occasionally on a "handful of nuts".    Subjective:   1. Essential hypertension Review: taking medications as instructed, no medication side effects noted, no chest pain on exertion, no dyspnea on exertion, no swelling of ankles.  She is on benazepril.  Denies issues or concerns.  BP Readings from Last 3 Encounters:  06/13/20 114/76  05/30/20 130/81  02/23/20 120/80   2. Insulin resistance Hailey [name] has a diagnosis of insulin resistance based on her elevated fasting insulin level >5. She continues to work on diet and exercise to decrease her risk of diabetes.  Slightly elevated fasting insulin.  A1c within normal limits.  Lab Results  Component Value Date   INSULIN 5.5 05/30/2020   Lab Results  Component Value Date   HGBA1C 5.3 05/30/2020   3. Vitamin D deficiency Hailey [name]'s Vitamin D level was 36.4 on 05/30/2020. She is currently taking OTC vitamin D 1,000 IU each day. She denies nausea, vomiting or muscle weakness.  4. Vitamin B12 deficiency She is not a vegetarian.  She does not have a previous diagnosis of pernicious anemia.  She does not have a history of  weight loss surgery.  She is taking an unknown OTC dose.  Lab Results  Component Value Date   VITAMINB12 >2000 (H) 05/30/2020   5. Other hyperlipidemia Hailey [name] has hyperlipidemia and has been trying to improve her cholesterol levels with intensive lifestyle modification including a low saturated fat diet, exercise and weight loss. She denies any chest pain, claudication or myalgias.  Slightly elevated LDL.  Lab Results  Component Value Date   ALT 13 05/30/2020   AST 15 05/30/2020   ALKPHOS 103 05/30/2020   BILITOT 0.4 05/30/2020   Lab Results  Component Value Date   CHOL 178 05/30/2020   HDL 67 05/30/2020   LDLCALC 103 (H) 05/30/2020   LDLDIRECT 77.0 02/24/2019   TRIG 39 05/30/2020   CHOLHDL 2 02/24/2019   6. At risk for osteoporosis Hailey [name] was given approximately 16 minutes of osteoporosis prevention counseling today.   Hailey [name] is at risk for osteopenia and osteoporosis due to Vitamin D deficiency, as well as other risk factors.  We discussed the importance of prudent screenings through her PCP's office for prevention.     Hailey [name] was encouraged to take her Vitamin D and follow her calcium rich diet.  We will continue to monitor vitamin D levels to ensure treatment is appropriate.   It is recommended that she eventually engage in weight bearing exercises and muscle strengthening exercises to help improve bone density and decrease her risk of osteopenia and osteoporosis.  Assessment/Plan:  1. Essential hypertension Hailey [name] is working on healthy weight loss and exercise to improve blood pressure control. We will watch for signs of hypotension as she continues her lifestyle modifications.  At goal.  Continue medications, prudent nutritional plan, weight loss.  Continue to monitor closely as she loses weight.  2. Insulin resistance Hailey [name] will continue to work on weight loss, exercise, and decreasing simple carbohydrates to help decrease the risk of diabetes. Hailey [name] agreed to follow-up with [place]  as directed to closely monitor her progress.  Counseling provided regarding insulin resistance.  Handout given.  Nutrition discussed on how to make changes to improve condition.  3. Vitamin D deficiency Discussed labs with patient today.  Low Vitamin D level contributes to fatigue and are associated with obesity, breast, and colon cancer. She agrees to start to take prescription Vitamin D @50 ,000 IU every week and will follow-up for routine testing of Vitamin D, at least 2-3 times per year to avoid over-replacement.  Counseling done.  4. Vitamin B12 deficiency The diagnosis was reviewed with the patient. Counseling provided today, see below. We will continue to monitor. Orders and follow up as documented in patient record.  Cut dose of B12 OTC in half.  Recheck labs in 3 months or so.  Counseling . The body needs vitamin B12: to make red blood cells; to make DNA; and to help the nerves work properly so they can carry messages from the brain to the body.  . The main causes of vitamin B12 deficiency include dietary deficiency, digestive diseases, pernicious anemia, and having a surgery in which part of the stomach or small intestine is removed.  . Certain medicines can make it harder for the body to absorb vitamin B12. These medicines include: heartburn medications; some antibiotics; some medications used to treat diabetes, gout, and high cholesterol.  . In some cases, there are no symptoms of this condition. If the condition leads to anemia or nerve damage, various symptoms can occur, such as weakness or fatigue, shortness of breath, and numbness or tingling in your hands and feet.   . Treatment:  o May include taking vitamin B12 supplements.  o Avoid alcohol.  o Eat lots of healthy foods that contain vitamin B12: - Beef, pork, chicken, , and organ meats, such as liver.  - Seafood: This includes clams, rainbow trout, salmon, tuna, and haddock. Eggs.  - Cereal and dairy products that are  fortified: This means that vitamin B12 has been added to the food.   5. Other hyperlipidemia Cardiovascular risk and specific lipid/LDL goals reviewed.  We discussed several lifestyle modifications today and Hailey [name] will continue to work on diet, exercise and weight loss efforts. Orders and follow up as documented in patient record.   Counseling Intensive lifestyle modifications are the first line treatment for this issue. . Dietary changes: Increase soluble fiber. Decrease simple carbohydrates. . Exercise changes: Moderate to vigorous-intensity aerobic activity 150 minutes per week if tolerated. . Lipid-lowering medications: see documented in medical record.  6. At risk for osteoporosis Hailey [name] was given approximately 16 minutes of osteoporosis prevention counseling today.   Hailey [name] is at risk for osteopenia and osteoporosis due to Vitamin D deficiency, as well as other risk factors.  We discussed the importance of prudent screenings through her PCP's office for prevention.     Hailey [name] was encouraged to take her Vitamin D and follow her calcium rich diet.  We will continue to monitor vitamin D levels to ensure treatment is appropriate.   It  is recommended that she eventually engage in weight bearing exercises and muscle strengthening exercises to help improve bone density and decrease her risk of osteopenia and osteoporosis.  7. Class 2 severe obesity due to excess calories with serious comorbidity and body mass index (BMI) of 38.0 to 38.9 in adult [facility])  Hailey [name] is currently in the action stage of change. As such, her goal is to continue with weight loss efforts. She has agreed to the Category 1 Plan with lunch options (handout given).  She needs to weigh foods/measure (especially proteins and foods she eats "off plan").  Exercise goals: As is.  Behavioral modification strategies: increasing lean protein intake, decreasing simple carbohydrates, meal planning and cooking strategies, keeping healthy  foods in the home and planning for success.  Hailey [name] has agreed to follow-up with our clinic in 2 weeks. She was informed of the importance of frequent follow-up visits to maximize her success with intensive lifestyle modifications for her multiple health conditions.   Objective:   Blood pressure 114/76, pulse 71, temperature 98.1 F (36.7 C), height 5\' 5"  (1.651 m), weight 230 lb (104.3 kg), last menstrual period 03/14/2014, SpO2 98 %. Body mass index is 38.27 kg/m.  General: Cooperative, alert, well developed, in no acute distress. HEENT: Conjunctivae and lids unremarkable. Cardiovascular: Regular rhythm.  Lungs: Normal work of breathing. Neurologic: No focal deficits.   Lab Results  Component Value Date   CREATININE 0.60 05/30/2020   BUN 14 05/30/2020   NA 142 05/30/2020   K 4.5 05/30/2020   CL 105 05/30/2020   CO2 24 05/30/2020   Lab Results  Component Value Date   ALT 13 05/30/2020   AST 15 05/30/2020   ALKPHOS 103 05/30/2020   BILITOT 0.4 05/30/2020   Lab Results  Component Value Date   HGBA1C 5.3 05/30/2020   Lab Results  Component Value Date   INSULIN 5.5 05/30/2020   Lab Results  Component Value Date   TSH 1.290 05/30/2020   Lab Results  Component Value Date   CHOL 178 05/30/2020   HDL 67 05/30/2020   LDLCALC 103 (H) 05/30/2020   LDLDIRECT 77.0 02/24/2019   TRIG 39 05/30/2020   CHOLHDL 2 02/24/2019   Lab Results  Component Value Date   WBC 4.5 05/30/2020   HGB 13.0 05/30/2020   HCT 40.6 05/30/2020   MCV 93 05/30/2020   PLT 335 05/30/2020   Attestation Statements:   Reviewed by clinician on day of visit: allergies, medications, problem list, medical history, surgical history, family history, social history, and previous encounter notes.  I, 07/30/2020, CMA, am acting as Insurance claims handler for Energy manager, DO.  I have reviewed the above documentation for accuracy and completeness, and I agree with the above. [employer], DO

## 2020-07-01 ENCOUNTER — Ambulatory Visit (INDEPENDENT_AMBULATORY_CARE_PROVIDER_SITE_OTHER): Payer: BC Managed Care – PPO | Admitting: Family Medicine

## 2020-07-01 ENCOUNTER — Other Ambulatory Visit: Payer: Self-pay

## 2020-07-01 ENCOUNTER — Encounter (INDEPENDENT_AMBULATORY_CARE_PROVIDER_SITE_OTHER): Payer: Self-pay | Admitting: Family Medicine

## 2020-07-01 VITALS — BP 109/69 | HR 60 | Temp 98.5°F | Ht 65.0 in | Wt 225.0 lb

## 2020-07-01 DIAGNOSIS — Z9189 Other specified personal risk factors, not elsewhere classified: Secondary | ICD-10-CM | POA: Diagnosis not present

## 2020-07-01 DIAGNOSIS — I10 Essential (primary) hypertension: Secondary | ICD-10-CM | POA: Diagnosis not present

## 2020-07-01 DIAGNOSIS — Z6837 Body mass index (BMI) 37.0-37.9, adult: Secondary | ICD-10-CM | POA: Diagnosis not present

## 2020-07-01 DIAGNOSIS — E559 Vitamin D deficiency, unspecified: Secondary | ICD-10-CM

## 2020-07-01 MED ORDER — VITAMIN D (ERGOCALCIFEROL) 1.25 MG (50000 UNIT) PO CAPS: 50000.0000 [IU] | ORAL_CAPSULE | ORAL | 0 refills | Status: DC

## 2020-07-02 NOTE — Progress Notes (Signed)
Chief Complaint:   OBESITY Hailey [name] is here to discuss her progress with her obesity treatment plan along with follow-up of her obesity related diagnoses. Hailey [name] is on the Category 1 Plan and states she is following her eating plan approximately 85-90% of the time. Hailey [name] states she is using the elliptical, walking, and bench pressing for 60 minutes 6 times per week.  Today's visit was #: 3 Starting weight: 234 lbs Starting date: 05/30/2020 Today's weight: 225 lbs Today's date: 07/01/2020 Total lbs lost to date: 9 lbs Total lbs lost since last in-office visit: 5 lbs  Interim History:  Hailey [name] says she started measuring her foods/proteins.  She is getting it all in.  She is "sticking to it much better".  She says she is exercising 5 days per week and goes for about an hour total (30 minutes elliptical/walking plus weight lifting upper body and sit ups for 30 minutes).  Other days, she will walk for 1 hours.  Denies hunger and cravings.  Her 37th wedding anniversary is this Wednesday.  No plans to eat out as they ave been doing "too good".   Assessment/Plan:   1. Vitamin D deficiency Hailey [name]'s Vitamin D level was 36.4 on 05/30/2020. She is currently taking prescription vitamin D 50,000 IU each week. She denies nausea, vomiting or muscle weakness.  Plan:  Low Vitamin D level contributes to fatigue and are associated with obesity, breast, and colon cancer. She agrees to continue to take prescription Vitamin D @50 ,000 IU every week and will follow-up for routine testing of Vitamin D, at least 2-3 times per year to avoid over-replacement.  -Refill Vitamin D, Ergocalciferol, (DRISDOL) 1.25 MG (50000 UNIT) CAPS capsule; Take 1 capsule (50,000 Units total) by mouth every 7 (seven) days.  Dispense: 4 capsule; Refill: 0  2. Essential hypertension Review: taking medications as instructed, no medication side effects noted, no chest pain on exertion, no dyspnea on exertion, no swelling of ankles.   Plan:    -  Hailey [name] is working on healthy weight loss and exercise to improve blood pressure control.  - Blood pressure currently is at goal  - Patient will continue current treatment regimen and medication change is not required today  - Counseled patient on pathophysiology of disease and discussed various treatment options, which always includes dietary and lifestyle modification as first line.   - Lifestyle changes such as following our low salt, heart healthy meal plan and engaging in a regular exercise program discussed extensively with patient.   - Ambulatory blood pressure monitoring encouraged at least 2-3 times weekly.  Keep log and bring in every office visit.  Reminded patient that if they ever feel poorly in any way, to check their blood pressure and pulse as well.  - We will continue to monitor closely alongside PCP/ specialists.  Pt reminded to also f/up with those individuals as instructed by them.    BP Readings from Last 3 Encounters:  07/01/20 109/69  06/13/20 114/76  05/30/20 130/81   3. At risk for complication associated with hypotension Hailey [name] was given approximately 15 minutes of education and counseling today to help avoid hypotension. We discussed risks of hypotension with weight loss and signs of hypotension such as feeling lightheaded or unsteady.  4. Class 2 severe obesity with serious comorbidity and body mass index (BMI) of 37.0 to 37.9 in adult, unspecified obesity type (HCC)  Hailey [name] is currently in the action stage of change. As such, her goal is to continue  with weight loss efforts. She has agreed to the Category 1 Plan.   Exercise goals: As is.  Behavioral modification strategies: increasing lean protein intake, increasing water intake, meal planning and cooking strategies, keeping healthy foods in the home, celebration eating strategies and planning for success.  Hailey [name] has agreed to follow-up with our clinic in 2 weeks. She was informed of the importance of  frequent follow-up visits to maximize her success with intensive lifestyle modifications for her multiple health conditions.   Objective:   Blood pressure 109/69, pulse 60, temperature 98.5 F (36.9 C), height 5\' 5"  (1.651 m), weight 225 lb (102.1 kg), last menstrual period 03/14/2014, SpO2 99 %. Body mass index is 37.44 kg/m.  General: Cooperative, alert, well developed, in no acute distress. HEENT: Conjunctivae and lids unremarkable. Cardiovascular: Regular rhythm.  Lungs: Normal work of breathing. Neurologic: No focal deficits.   Lab Results  Component Value Date   CREATININE 0.60 05/30/2020   BUN 14 05/30/2020   NA 142 05/30/2020   K 4.5 05/30/2020   CL 105 05/30/2020   CO2 24 05/30/2020   Lab Results  Component Value Date   ALT 13 05/30/2020   AST 15 05/30/2020   ALKPHOS 103 05/30/2020   BILITOT 0.4 05/30/2020   Lab Results  Component Value Date   HGBA1C 5.3 05/30/2020   Lab Results  Component Value Date   INSULIN 5.5 05/30/2020   Lab Results  Component Value Date   TSH 1.290 05/30/2020   Lab Results  Component Value Date   CHOL 178 05/30/2020   HDL 67 05/30/2020   LDLCALC 103 (H) 05/30/2020   LDLDIRECT 77.0 02/24/2019   TRIG 39 05/30/2020   CHOLHDL 2 02/24/2019   Lab Results  Component Value Date   WBC 4.5 05/30/2020   HGB 13.0 05/30/2020   HCT 40.6 05/30/2020   MCV 93 05/30/2020   PLT 335 05/30/2020   Attestation Statements:   Reviewed by clinician on day of visit: allergies, medications, problem list, medical history, surgical history, family history, social history, and previous encounter notes.  I, 07/30/2020, CMA, am acting as Insurance claims handler for Energy manager, DO.  I have reviewed the above documentation for accuracy and completeness, and I agree with the above. [employer], DO

## 2020-07-10 ENCOUNTER — Telehealth: Payer: Self-pay | Admitting: Podiatry

## 2020-07-10 DIAGNOSIS — M2142 Flat foot [pes planus] (acquired), left foot: Secondary | ICD-10-CM

## 2020-07-10 DIAGNOSIS — M2141 Flat foot [pes planus] (acquired), right foot: Secondary | ICD-10-CM

## 2020-07-10 NOTE — Telephone Encounter (Signed)
Pt left message yesterday @ 413pm stating she wanted to order another pair of orthotics.  I returned call and confirmed and she would like another pair ordered just like the last ones she got. I told pt I would call when they come in for her to pick them up.

## 2020-07-17 ENCOUNTER — Ambulatory Visit (INDEPENDENT_AMBULATORY_CARE_PROVIDER_SITE_OTHER): Payer: BC Managed Care – PPO | Admitting: Family Medicine

## 2020-07-17 ENCOUNTER — Encounter (INDEPENDENT_AMBULATORY_CARE_PROVIDER_SITE_OTHER): Payer: Self-pay | Admitting: Family Medicine

## 2020-07-17 ENCOUNTER — Other Ambulatory Visit: Payer: Self-pay

## 2020-07-17 VITALS — BP 124/71 | HR 63 | Temp 97.9°F | Ht 65.0 in | Wt 222.0 lb

## 2020-07-17 DIAGNOSIS — Z9189 Other specified personal risk factors, not elsewhere classified: Secondary | ICD-10-CM | POA: Diagnosis not present

## 2020-07-17 DIAGNOSIS — E559 Vitamin D deficiency, unspecified: Secondary | ICD-10-CM

## 2020-07-17 DIAGNOSIS — I10 Essential (primary) hypertension: Secondary | ICD-10-CM

## 2020-07-17 DIAGNOSIS — Z6836 Body mass index (BMI) 36.0-36.9, adult: Secondary | ICD-10-CM

## 2020-07-17 MED ORDER — VITAMIN D (ERGOCALCIFEROL) 1.25 MG (50000 UNIT) PO CAPS: 50000.0000 [IU] | ORAL_CAPSULE | ORAL | 0 refills | Status: DC

## 2020-07-18 DIAGNOSIS — Z9189 Other specified personal risk factors, not elsewhere classified: Secondary | ICD-10-CM | POA: Insufficient documentation

## 2020-07-23 NOTE — Progress Notes (Signed)
Chief Complaint:   OBESITY Hailey [name] is here to discuss her progress with her obesity treatment plan along with follow-up of her obesity related diagnoses. [name] is on the Category 1 Plan or has the option to journal daily to 1000-1100 calories with 80+ grams of protein per day.    SHe states she is following her eating plan approximately 97% of the time.   [name] states she is walking for 45-60 minutes 5 times per week.  Today's visit was #: 4 Starting weight: 234 lbs Starting date: 05/30/2020 Today's weight: 222 lbs Today's date: 07/17/2020 Total lbs lost to date: 12 lbs Total lbs lost since last in-office visit: 3 lbs Total weight loss percentage to date: -5.13%  Interim History: Hailey [name] says that her hunger is essentially nonexistent.  Cravings are good.  She has been exercising by walking and wonders how weight lifting will affect her weight loss and worries about it "putting weight on her".  Also has multiple questions about various substitutions that she has been making to her meal plan.   Assessment/Plan:   1. Essential hypertension At home, blood pressure is running 116/70 with pulse of 68, 123/82, 118/72.  Denies symptoms or concerns.  She is taking Lotensin 20 mg daily.  Plan:  Continue Lotensin at current dose.  Continue close monitoring as she loses weight.  BP Readings from Last 3 Encounters:  07/17/20 124/71  07/01/20 109/69  06/13/20 114/76   2. Vitamin D deficiency Hailey [name] has a history of Vitamin D deficiency with resultant generalized fatigue as her primary symptom.  she is taking vitamin D 50,000 IU weekly for this deficiency and tolerating it well without side-effect.    Most recent Vitamin D lab reviewed-  level: 36.4 on 05/30/2020.  January says that the pharmacy did not receive the prescription refill last time, despite it being refilled electronically.   Plan:  - Discussed importance of vitamin D (as well as calcium) to their health and  wellbeing.   - We reviewed possible symptoms of low Vitamin D including low energy, depressed mood, muscle aches, joint aches, osteoporosis, etc.  - We discussed that low Vitamin D levels may be linked to an increased risk of cardiovascular events, and even increased risk of cancers, such as colon and breast.   - Informed patient this may be a lifelong thing, and she was encouraged to continue to take the medicine until told otherwise.    We will need to monitor levels regularly (every 3-4 mo on average) to keep levels within normal limits.   -Refill Vitamin D, Ergocalciferol, (DRISDOL) 1.25 MG (50000 UNIT) CAPS capsule; Take 1 capsule (50,000 Units total) by mouth every 7 (seven) days.  Dispense: 4 capsule; Refill: 0    3. At risk for complication associated with hypotension Hailey [name] was given approximately 9 minutes of education and counseling today to help avoid hypotension. We discussed risks of hypotension with weight loss and signs of hypotension such as feeling lightheaded or unsteady.   4. Class 2 severe obesity with serious comorbidity and body mass index (BMI) of 36.0 to 36.9 in adult, unspecified obesity type (HCC)  Hailey [name] is currently in the action stage of change. As such, her goal is to continue with weight loss efforts. She has agreed to the Category 1 Plan or has the option to journal to 1000-1100 calories with 80+ grams of protein per day.  Exercise goals:  Okay to add weight lifting to regimen 2-3 days per week  as well.  Behavioral modification strategies: increasing lean protein intake, increasing vegetables, increasing water intake, meal planning and cooking strategies and planning for success.   Hailey [name] has agreed to follow-up with our clinic in 2-3 weeks.   She was informed of the importance of frequent follow-up visits to maximize her success with intensive lifestyle modifications for her multiple health conditions.   Objective:   Blood pressure 124/71, pulse 63,  temperature 97.9 F (36.6 C), height 5\' 5"  (1.651 m), weight 222 lb (100.7 kg), last menstrual period 03/14/2014, SpO2 99 %. Body mass index is 36.94 kg/m.  General: Cooperative, alert, well developed, in no acute distress. HEENT: Conjunctivae and lids unremarkable. Cardiovascular: Regular rhythm.  Lungs: Normal work of breathing. Neurologic: No focal deficits.   Lab Results  Component Value Date   CREATININE 0.60 05/30/2020   BUN 14 05/30/2020   NA 142 05/30/2020   K 4.5 05/30/2020   CL 105 05/30/2020   CO2 24 05/30/2020   Lab Results  Component Value Date   ALT 13 05/30/2020   AST 15 05/30/2020   ALKPHOS 103 05/30/2020   BILITOT 0.4 05/30/2020   Lab Results  Component Value Date   HGBA1C 5.3 05/30/2020   Lab Results  Component Value Date   INSULIN 5.5 05/30/2020   Lab Results  Component Value Date   TSH 1.290 05/30/2020   Lab Results  Component Value Date   CHOL 178 05/30/2020   HDL 67 05/30/2020   LDLCALC 103 (H) 05/30/2020   LDLDIRECT 77.0 02/24/2019   TRIG 39 05/30/2020   CHOLHDL 2 02/24/2019   Lab Results  Component Value Date   WBC 4.5 05/30/2020   HGB 13.0 05/30/2020   HCT 40.6 05/30/2020   MCV 93 05/30/2020   PLT 335 05/30/2020   Attestation Statements:   Reviewed by clinician on day of visit: allergies, medications, problem list, medical history, surgical history, family history, social history, and previous encounter notes.  I, 07/30/2020, CMA, am acting as Insurance claims handler for Energy manager, DO.  I have reviewed the above documentation for accuracy and completeness, and I agree with the above. -  *[employer], D.O.  The 21st Century Cures Act was signed into law in 2016 which includes the topic of electronic health records.  This provides immediate access to information in MyChart.  This includes consultation notes, operative notes, office notes, lab results and pathology reports.  If you have any questions about what you read  please let 2017 know at your next visit so we can discuss your concerns and take corrective action if need be. We are right here with you.

## 2020-08-05 ENCOUNTER — Encounter (INDEPENDENT_AMBULATORY_CARE_PROVIDER_SITE_OTHER): Payer: Self-pay | Admitting: Family Medicine

## 2020-08-05 ENCOUNTER — Other Ambulatory Visit: Payer: Self-pay

## 2020-08-05 ENCOUNTER — Ambulatory Visit (INDEPENDENT_AMBULATORY_CARE_PROVIDER_SITE_OTHER): Payer: BC Managed Care – PPO | Admitting: Family Medicine

## 2020-08-05 VITALS — BP 110/62 | HR 72 | Temp 97.7°F | Ht 65.0 in | Wt 219.0 lb

## 2020-08-05 DIAGNOSIS — Z9189 Other specified personal risk factors, not elsewhere classified: Secondary | ICD-10-CM

## 2020-08-05 DIAGNOSIS — E559 Vitamin D deficiency, unspecified: Secondary | ICD-10-CM | POA: Diagnosis not present

## 2020-08-05 DIAGNOSIS — I10 Essential (primary) hypertension: Secondary | ICD-10-CM

## 2020-08-05 DIAGNOSIS — Z6836 Body mass index (BMI) 36.0-36.9, adult: Secondary | ICD-10-CM

## 2020-08-05 MED ORDER — VITAMIN D (ERGOCALCIFEROL) 1.25 MG (50000 UNIT) PO CAPS: 50000.0000 [IU] | ORAL_CAPSULE | ORAL | 0 refills | Status: DC

## 2020-08-07 NOTE — Progress Notes (Signed)
Chief Complaint:   OBESITY Hailey [name] is here to discuss her progress with her obesity treatment plan along with follow-up of her obesity related diagnoses. Hailey [name] is on the Category 1 Plan and keeping a food journal and adhering to recommended goals of 1000-1100 calories and 80 grams of protein and states she is following her eating plan approximately 90% of the time. Hailey [name] states she is walking for 60 minutes 3 times per week.  Today's visit was #: 5 Starting weight: 234 lbs Starting date: 05/30/2020 Today's weight: 219 lbs Today's date: 08/05/2020 Total lbs lost to date: 15 lbs Total lbs lost since last in-office visit: 3 lbs Total weight loss percentage to date: -6.41%  Interim History:  Hailey [name] says she had a cousin pass, thus she was at her home with family and did not eat as well.  Hunger is well controlled, but still has cravings for food but not as bad.  She does not think she needs a medication to help with this.  She is mostly journaling and hits 100-120 grams of protein 90% of the time.  She reports she hits her calorie goals of 1000-1100 as well.   - She added weight lifting to her regimen 2-3 days a week and says it is working well for her.    Assessment/Plan:   Meds ordered this encounter  Medications  . Vitamin D, Ergocalciferol, (DRISDOL) 1.25 MG (50000 UNIT) CAPS capsule    Sig: Take 1 capsule (50,000 Units total) by mouth every 7 (seven) days.    Dispense:  4 capsule    Refill:  0     1. Vitamin D deficiency Hailey [name]'s Vitamin D level was 36.4 on 05/30/2020. She is currently taking prescription vitamin D 50,000 IU each week. She denies nausea, vomiting or muscle weakness.  -Refill Vitamin D, Ergocalciferol, (DRISDOL) 1.25 MG (50000 UNIT) CAPS capsule; Take 1 capsule (50,000 Units total) by mouth every 7 (seven) days.  Dispense: 4 capsule; Refill: 0    2. Essential hypertension Blood pressure is 124/72, 120s/70s.  No symptoms or concerns.  She is taking benazepril  daily.  Tol well  Plan:  Continue medication per PCP and continue exercise, prudent nutritional plan, weight loss.  Home blood pressure monitoring recommended daily.  BP Readings from Last 3 Encounters:  08/05/20 110/62  07/17/20 124/71  07/01/20 109/69     3. At risk for osteoporosis Hailey [name] was given approximately 15 minutes of osteoporosis prevention counseling today.   Hailey [name] is at risk for osteopenia and osteoporosis due to Vitamin D deficiency, as well as other risk factors.  We discussed the importance of prudent screenings through her PCP's office for prevention.   Hailey [name] was encouraged to take her Vitamin D and follow her calcium rich diet.  We will continue to monitor vitamin D levels to ensure treatment is appropriate.  It is recommended that she eventually engage in weight bearing exercises and muscle strengthening exercises to help improve bone density and decrease her risk of osteopenia and osteoporosis.    4. Class 2 severe obesity with serious comorbidity and body mass index (BMI) of 36.0 to 36.9 in adult, unspecified obesity type (HCC)  Hailey [name] is currently in the action stage of change. As such, her goal is to continue with weight loss efforts. She has agreed to change from CAT 1 with journaling to --> she will ONLY keep a food journal and adhering to recommended goals of 1000-1100 calories and 100+ grams of protein/ day.  Exercise goals: As is.  Behavioral modification strategies: no skipping meals, meal planning and cooking strategies, keeping healthy foods in the home and planning for success.   Hailey [name] has agreed to follow-up with our clinic in 2 weeks after changing to purely journaling.   She was informed of the importance of frequent follow-up visits to maximize her success with intensive lifestyle modifications for her multiple health conditions.   Objective:   Blood pressure 110/62, pulse 72, temperature 97.7 F (36.5 C), height 5\' 5"  (1.651 m), weight 219 lb  (99.3 kg), last menstrual period 03/14/2014, SpO2 97 %. Body mass index is 36.44 kg/m.  General: Cooperative, alert, well developed, in no acute distress. HEENT: Conjunctivae and lids unremarkable. Cardiovascular: Regular rhythm.  Lungs: Normal work of breathing. Neurologic: No focal deficits.   Lab Results  Component Value Date   CREATININE 0.60 05/30/2020   BUN 14 05/30/2020   NA 142 05/30/2020   K 4.5 05/30/2020   CL 105 05/30/2020   CO2 24 05/30/2020   Lab Results  Component Value Date   ALT 13 05/30/2020   AST 15 05/30/2020   ALKPHOS 103 05/30/2020   BILITOT 0.4 05/30/2020   Lab Results  Component Value Date   HGBA1C 5.3 05/30/2020   Lab Results  Component Value Date   INSULIN 5.5 05/30/2020   Lab Results  Component Value Date   TSH 1.290 05/30/2020   Lab Results  Component Value Date   CHOL 178 05/30/2020   HDL 67 05/30/2020   LDLCALC 103 (H) 05/30/2020   LDLDIRECT 77.0 02/24/2019   TRIG 39 05/30/2020   CHOLHDL 2 02/24/2019   Lab Results  Component Value Date   WBC 4.5 05/30/2020   HGB 13.0 05/30/2020   HCT 40.6 05/30/2020   MCV 93 05/30/2020   PLT 335 05/30/2020   Attestation Statements:   Reviewed by clinician on day of visit: allergies, medications, problem list, medical history, surgical history, family history, social history, and previous encounter notes.  I, 07/30/2020, CMA, am acting as Insurance claims handler for Energy manager, DO.  I have reviewed the above documentation for accuracy and completeness, and I agree with the above. [employer], D.O.  The 21st Century Cures Act was signed into law in 2016 which includes the topic of electronic health records.  This provides immediate access to information in MyChart.  This includes consultation notes, operative notes, office notes, lab results and pathology reports.  If you have any questions about what you read please let 2017 know at your next visit so we can discuss your concerns and  take corrective action if need be.  We are right here with you.

## 2020-08-26 ENCOUNTER — Ambulatory Visit (INDEPENDENT_AMBULATORY_CARE_PROVIDER_SITE_OTHER): Payer: BC Managed Care – PPO | Admitting: Family Medicine

## 2020-09-03 ENCOUNTER — Other Ambulatory Visit: Payer: Self-pay

## 2020-09-03 ENCOUNTER — Encounter (INDEPENDENT_AMBULATORY_CARE_PROVIDER_SITE_OTHER): Payer: Self-pay | Admitting: Family Medicine

## 2020-09-03 ENCOUNTER — Ambulatory Visit (INDEPENDENT_AMBULATORY_CARE_PROVIDER_SITE_OTHER): Payer: BC Managed Care – PPO | Admitting: Family Medicine

## 2020-09-03 VITALS — BP 107/68 | HR 72 | Temp 98.0°F | Ht 65.0 in | Wt 217.0 lb

## 2020-09-03 DIAGNOSIS — Z6836 Body mass index (BMI) 36.0-36.9, adult: Secondary | ICD-10-CM

## 2020-09-03 DIAGNOSIS — Z9189 Other specified personal risk factors, not elsewhere classified: Secondary | ICD-10-CM | POA: Insufficient documentation

## 2020-09-03 DIAGNOSIS — E559 Vitamin D deficiency, unspecified: Secondary | ICD-10-CM | POA: Diagnosis not present

## 2020-09-03 MED ORDER — VITAMIN D (ERGOCALCIFEROL) 1.25 MG (50000 UNIT) PO CAPS: 50000.0000 [IU] | ORAL_CAPSULE | ORAL | 0 refills | Status: DC

## 2020-09-04 NOTE — Progress Notes (Signed)
Chief Complaint:   OBESITY Hailey [name] is here to discuss her progress with her obesity treatment plan along with follow-up of her obesity related diagnoses. Hailey [name] is on keeping a food journal and adhering to recommended goals of 1000-1100 calories and 100 grams protein and states she is following her eating plan approximately 70% of the time. Hailey [name] states she is weight lifting 15-20 minutes 3 times per week.  Today's visit was #: 6 Starting weight: 234 lbs Starting date: 05/30/2020 Today's weight: 217 lbs Today's date: 09/03/2020 Total lbs lost to date: 17 lbs Total lbs lost since last in-office visit: 2 lbs Total weight loss percentage to date: -7.26%  Interim History: Hailey [name] reports that she traveled over the holidays and is surprised that she lost weight. She hits 100-140+ grams of protein daily on average. For breakfast she has 2 eggs, 3 pieces of 97% FF canadian bacon or low sodium [place] or 6 oz of ham. She skips lunch most days or eats 6 oz chicken breast on lettuce with 2 tablespoons of parmesan cheese. At dinner she has mussels, shrimp, or 6 oz seafood plus a vegetable.  Assessment/Plan:   1. Vitamin D deficiency Hailey [name]'s Vitamin D level was 36.4 on 05/30/2020. She is currently taking prescription vitamin D 50,000 IU each week. She denies nausea, vomiting or muscle weakness.  Plan: Refill Vit D for 1 month, as per below. Low Vitamin D level contributes to fatigue and are associated with obesity, breast, and colon cancer. She agrees to continue to take prescription Vitamin D @50 ,000 IU every week and will follow-up for routine testing of Vitamin D, at least 2-3 times per year to avoid over-replacement.  Refill- Vitamin D, Ergocalciferol, (DRISDOL) 1.25 MG (50000 UNIT) CAPS capsule; Take 1 capsule (50,000 Units total) by mouth every 7 (seven) days.  Dispense: 4 capsule; Refill: 0  2. At risk for activity intolerance Hailey [name] was given approximately 15 minutes of exercise intolerance  counseling today. She is 59 y.o. female and has risk factors exercise intolerance including obesity. We discussed intensive lifestyle modifications today with an emphasis on specific weight loss instructions and strategies. Hailey [name] will slowly increase activity as tolerated.  3. Class 2 severe obesity with serious comorbidity and body mass index (BMI) of 36.0 to 36.9 in adult, unspecified obesity type (HCC) Hailey [name] is currently in the action stage of change. As such, her goal is to continue with weight loss efforts. She has agreed to keeping a food journal and adhering to recommended goals of 1000-1100 calories and 100+ grams protein daily.   Exercise goals: As tolerated with current knee pain  Behavioral modification strategies: no skipping meals and meal planning and cooking strategies.  Hailey [name] has agreed to follow-up with our clinic in 2-4 weeks (pt is not sure, with the holidays, when she will be able to come back). She was informed of the importance of frequent follow-up visits to maximize her success with intensive lifestyle modifications for her multiple health conditions.   Objective:   Blood pressure 107/68, pulse 72, temperature 98 F (36.7 C), height 5\' 5"  (1.651 m), weight 217 lb (98.4 kg), last menstrual period 03/14/2014, SpO2 99 %. Body mass index is 36.11 kg/m.  General: Cooperative, alert, well developed, in no acute distress. HEENT: Conjunctivae and lids unremarkable. Cardiovascular: Regular rhythm.  Lungs: Normal work of breathing. Neurologic: No focal deficits.   Lab Results  Component Value Date   CREATININE 0.60 05/30/2020   BUN 14 05/30/2020   NA 142 05/30/2020  K 4.5 05/30/2020   CL 105 05/30/2020   CO2 24 05/30/2020   Lab Results  Component Value Date   ALT 13 05/30/2020   AST 15 05/30/2020   ALKPHOS 103 05/30/2020   BILITOT 0.4 05/30/2020   Lab Results  Component Value Date   HGBA1C 5.3 05/30/2020   Lab Results  Component Value Date   INSULIN 5.5  05/30/2020   Lab Results  Component Value Date   TSH 1.290 05/30/2020   Lab Results  Component Value Date   CHOL 178 05/30/2020   HDL 67 05/30/2020   LDLCALC 103 (H) 05/30/2020   LDLDIRECT 77.0 02/24/2019   TRIG 39 05/30/2020   CHOLHDL 2 02/24/2019   Lab Results  Component Value Date   WBC 4.5 05/30/2020   HGB 13.0 05/30/2020   HCT 40.6 05/30/2020   MCV 93 05/30/2020   PLT 335 05/30/2020    Attestation Statements:   Reviewed by clinician on day of visit: allergies, medications, problem list, medical history, surgical history, family history, social history, and previous encounter notes.  Hailey [name], am acting as Energy manager for [employer], DO.  I have reviewed the above documentation for accuracy and completeness, and I agree with the above. Hailey [name], D.O.  The 21st Century Cures Act was signed into law in 2016 which includes the topic of electronic health records.  This provides immediate access to information in MyChart.  This includes consultation notes, operative notes, office notes, lab results and pathology reports.  If you have any questions about what you read please let us know at your next visit so we can discuss your concerns and take corrective action if need be.  We are right here with you.

## 2020-09-10 ENCOUNTER — Ambulatory Visit: Payer: Self-pay

## 2020-09-10 ENCOUNTER — Other Ambulatory Visit: Payer: Self-pay

## 2020-09-10 ENCOUNTER — Ambulatory Visit (INDEPENDENT_AMBULATORY_CARE_PROVIDER_SITE_OTHER): Payer: BC Managed Care – PPO | Admitting: Orthopaedic Surgery

## 2020-09-10 ENCOUNTER — Encounter: Payer: Self-pay | Admitting: Orthopaedic Surgery

## 2020-09-10 DIAGNOSIS — M1711 Unilateral primary osteoarthritis, right knee: Secondary | ICD-10-CM | POA: Diagnosis not present

## 2020-09-10 DIAGNOSIS — Z96652 Presence of left artificial knee joint: Secondary | ICD-10-CM

## 2020-09-10 MED ORDER — LIDOCAINE HCL 1 % IJ SOLN
2.0000 mL | INTRAMUSCULAR | Status: AC | PRN
Start: 2020-09-10 — End: 2020-09-10
  Administered 2020-09-10: 2 mL

## 2020-09-10 MED ORDER — METHYLPREDNISOLONE ACETATE 40 MG/ML IJ SUSP
40.0000 mg | INTRAMUSCULAR | Status: AC | PRN
  Administered 2020-09-10: 40 mg via INTRA_ARTICULAR

## 2020-09-10 MED ORDER — BUPIVACAINE HCL 0.5 % IJ SOLN
2.0000 mL | INTRAMUSCULAR | Status: AC | PRN
  Administered 2020-09-10: 2 mL via INTRA_ARTICULAR

## 2020-09-10 NOTE — Progress Notes (Signed)
Office Visit Note   Patient: Hailey [name]           Date of Birth: November 16, 1960           MRN: [id number] Visit Date: 09/10/2020              Requested by: Mliss Sax, MD [address] [place],  [place] [address] PCP: Mliss Sax, MD   Assessment & Plan: Visit Diagnoses:  1. Status post total left knee replacement   2. Primary osteoarthritis of right knee     Plan: Patient is doing very well for her 1 year total knee replacement visit.  Dental prophylaxis reinforced.  Cortisone injection administered to the right knee today.  Patient has lost 18 pounds recently and will continue to work on losing another 20.  We will see her back in a year for 2-year visit for her left knee replacement.  Follow-Up Instructions: Return in about 1 year (around 09/10/2021).   Orders:  Orders Placed This Encounter  Procedures  . XR Knee 1-2 Views Left   No orders of the defined types were placed in this encounter.     Procedures: Large Joint Inj: R knee on 09/10/2020 4:32 PM Indications: pain Details: 22 G needle  Arthrogram: No  Medications: 40 mg methylPREDNISolone acetate 40 MG/ML; 2 mL lidocaine 1 %; 2 mL bupivacaine 0.5 % Consent was given by the patient. Patient was prepped and draped in the usual sterile fashion.       Clinical Data: No additional findings.   Subjective: Chief Complaint  Patient presents with  . Left Knee - Follow-up  . Right Knee - Pain    Hailey [name] is 51-month status post left total knee replacement and following up for chronic right knee pain due to OA.  No injuries or trauma.  No complaints with a left knee replacement.   Review of Systems  Constitutional: Negative.   HENT: Negative.   Eyes: Negative.   Respiratory: Negative.   Cardiovascular: Negative.   Endocrine: Negative.   Musculoskeletal: Negative.   Neurological: Negative.   Hematological: Negative.   Psychiatric/Behavioral: Negative.   All other  systems reviewed and are negative.    Objective: Vital Signs: LMP 03/14/2014 (Approximate) Comment: perimenopause  Physical Exam Vitals and nursing note reviewed.  Constitutional:      Appearance: She is well-developed and well-nourished.  Pulmonary:     Effort: Pulmonary effort is normal.  Skin:    General: Skin is warm.     Capillary Refill: Capillary refill takes less than 2 seconds.  Neurological:     Mental Status: She is alert and oriented to person, place, and time.  Psychiatric:        Mood and Affect: Mood and affect normal.        Behavior: Behavior normal.        Thought Content: Thought content normal.        Judgment: Judgment normal.     Ortho Exam Left knee shows a fully healed surgical scar.  Good range of motion good quad strength.  Right knee shows no joint effusion.  Mild pain with range of motion. Specialty Comments:  No specialty comments available.  Imaging: XR Knee 1-2 Views Left  Result Date: 09/10/2020 Stable total knee replacement in good alignment.     PMFS History: Patient Active Problem List   Diagnosis Date Noted  . Primary osteoarthritis of right knee 09/10/2020  . At risk for activity intolerance  09/03/2020  . At risk for complication associated with hypotension 07/18/2020  . Vitamin D deficiency 05/30/2020  . B12 nutritional deficiency 05/30/2020  . Anemia 05/30/2020  . Fire ant bite 02/23/2020  . Status post total left knee replacement 10/16/2019  . Primary osteoarthritis of left knee 09/06/2019  . Health care maintenance 02/24/2019  . Chronic sinusitis 10/04/2018  . CARPAL TUNNEL SYNDROME, BILATERAL 08/04/2010  . GERD 03/06/2008  . Class 2 obesity due to excess calories with body mass index (BMI) of 38.0 to 38.9 in adult 02/10/2007  . Essential hypertension 02/10/2007   Past Medical History:  Diagnosis Date  . Anemia   . Arthritis   . B12 deficiency   . CARPAL TUNNEL SYNDROME, BILATERAL 08/04/2010  . GERD 03/06/2008  .  HYPERTENSION 02/10/2007  . Obesity   . OBESITY NOS 02/10/2007  . Sinusitis   . Swelling   . Vitamin D deficiency     Family History  Problem Relation Age of Onset  . Liver disease Mother   . Hypertension Father     Past Surgical History:  Procedure Laterality Date  . CARPAL TUNNEL RELEASE     Both hands  . HEMORRHOID SURGERY    . NASAL SINUS SURGERY    . TOTAL KNEE ARTHROPLASTY Left 10/16/2019   Procedure: LEFT TOTAL KNEE ARTHROPLASTY;  Surgeon: Tarry Kos, MD;  Location: [facility] OR;  Service: Orthopedics;  Laterality: Left;   Social History   Occupational History  . Not on file  Tobacco Use  . Smoking status: Never Smoker  . Smokeless tobacco: Never Used  Substance and Sexual Activity  . Alcohol use: Yes    Comment: rarley  . Drug use: No  . Sexual activity: Not on file

## 2020-09-24 ENCOUNTER — Other Ambulatory Visit: Payer: Self-pay | Admitting: Family Medicine

## 2020-09-24 DIAGNOSIS — I10 Essential (primary) hypertension: Secondary | ICD-10-CM

## 2020-09-24 NOTE — Telephone Encounter (Signed)
Last OV 02/23/20 Last fill 04/15/20  #90/0

## 2020-10-23 ENCOUNTER — Other Ambulatory Visit: Payer: BC Managed Care – PPO | Admitting: Orthotics

## 2020-10-31 ENCOUNTER — Ambulatory Visit: Payer: BC Managed Care – PPO | Admitting: Orthotics

## 2020-10-31 ENCOUNTER — Other Ambulatory Visit: Payer: Self-pay

## 2020-10-31 DIAGNOSIS — M79675 Pain in left toe(s): Secondary | ICD-10-CM

## 2020-10-31 DIAGNOSIS — B351 Tinea unguium: Secondary | ICD-10-CM

## 2020-10-31 DIAGNOSIS — M79674 Pain in right toe(s): Secondary | ICD-10-CM

## 2020-10-31 DIAGNOSIS — M2141 Flat foot [pes planus] (acquired), right foot: Secondary | ICD-10-CM

## 2020-10-31 NOTE — Progress Notes (Signed)
Trimmed out material under 5th met b/l

## 2020-11-26 ENCOUNTER — Ambulatory Visit: Payer: BC Managed Care – PPO | Admitting: Orthopaedic Surgery

## 2021-01-07 ENCOUNTER — Other Ambulatory Visit: Payer: Self-pay | Admitting: Family Medicine

## 2021-01-07 DIAGNOSIS — I10 Essential (primary) hypertension: Secondary | ICD-10-CM

## 2021-01-16 ENCOUNTER — Ambulatory Visit: Payer: Self-pay | Admitting: Family Medicine

## 2021-01-22 ENCOUNTER — Telehealth: Payer: Self-pay | Admitting: Orthopaedic Surgery

## 2021-01-22 NOTE — Telephone Encounter (Signed)
Pt called stating she had a knee replacement with Dr. Roda Shutters and she has an dentist appt on 01/28/21 so she would like to have her antibiotics called in to her [facility] on [address]. Pt would like a CB when this has been done.   [phone]

## 2021-01-23 ENCOUNTER — Other Ambulatory Visit: Payer: Self-pay | Admitting: Physician Assistant

## 2021-01-23 MED ORDER — AMOXICILLIN 500 MG PO CAPS: ORAL_CAPSULE | ORAL | 0 refills | Status: DC

## 2021-01-23 NOTE — Telephone Encounter (Signed)
Sent in

## 2021-01-23 NOTE — Telephone Encounter (Signed)
Called patient no answer LMOM with details. Rx sent to pharm.

## 2021-02-14 ENCOUNTER — Telehealth: Payer: Self-pay

## 2021-02-14 ENCOUNTER — Other Ambulatory Visit: Payer: Self-pay | Admitting: Family Medicine

## 2021-02-14 ENCOUNTER — Ambulatory Visit (INDEPENDENT_AMBULATORY_CARE_PROVIDER_SITE_OTHER): Payer: BC Managed Care – PPO | Admitting: Family Medicine

## 2021-02-14 ENCOUNTER — Other Ambulatory Visit: Payer: Self-pay

## 2021-02-14 ENCOUNTER — Encounter: Payer: Self-pay | Admitting: Family Medicine

## 2021-02-14 VITALS — BP 120/78 | HR 77 | Temp 97.5°F | Ht 64.0 in | Wt 220.2 lb

## 2021-02-14 DIAGNOSIS — Z6836 Body mass index (BMI) 36.0-36.9, adult: Secondary | ICD-10-CM

## 2021-02-14 DIAGNOSIS — I10 Essential (primary) hypertension: Secondary | ICD-10-CM | POA: Diagnosis not present

## 2021-02-14 DIAGNOSIS — Z0000 Encounter for general adult medical examination without abnormal findings: Secondary | ICD-10-CM | POA: Diagnosis not present

## 2021-02-14 DIAGNOSIS — E559 Vitamin D deficiency, unspecified: Secondary | ICD-10-CM

## 2021-02-14 LAB — BASIC METABOLIC PANEL
BUN: 22 mg/dL (ref 6–23)
CO2: 29 mEq/L (ref 19–32)
Calcium: 9.2 mg/dL (ref 8.4–10.5)
Chloride: 103 mEq/L (ref 96–112)
Creatinine, Ser: 0.6 mg/dL (ref 0.40–1.20)
GFR: 97.82 mL/min (ref >60.00)
Glucose, Bld: 78 mg/dL (ref 70–99)
Potassium: 4.5 mEq/L (ref 3.5–5.1)
Sodium: 141 mEq/L (ref 135–145)

## 2021-02-14 LAB — URINALYSIS, ROUTINE W REFLEX MICROSCOPIC
Bilirubin Urine: NEGATIVE
Hgb urine dipstick: NEGATIVE
Ketones, ur: NEGATIVE
Leukocytes,Ua: NEGATIVE
Nitrite: NEGATIVE
Specific Gravity, Urine: 1.02 (ref 1.000–1.030)
Total Protein, Urine: NEGATIVE
Urine Glucose: NEGATIVE
Urobilinogen, UA: 0.2 (ref 0.0–1.0)
pH: 7 (ref 5.0–8.0)

## 2021-02-14 LAB — VITAMIN D 25 HYDROXY (VIT D DEFICIENCY, FRACTURES): VITD: 49.87 ng/mL (ref 30.00–100.00)

## 2021-02-14 MED ORDER — PLENITY PO CAPS: 1.0000 | ORAL_CAPSULE | Freq: Two times a day (BID) | ORAL | 5 refills | Status: DC

## 2021-02-14 NOTE — Telephone Encounter (Signed)
Spoke to pt she came back into the Lobby, told her discussed with Dr. Doreene Burke and he placed a referral for Colonoscopy. Told her someone will contact her to schedule. Pt verbalized understanding.

## 2021-02-14 NOTE — Progress Notes (Signed)
Established Patient Office Visit  Subjective:  Patient ID: Hailey [name], female    DOB: 30-Apr-1961  Age: 60 y.o. MRN: [id number]  CC:  Chief Complaint  Patient presents with  . Annual Exam    HPI Hailey [name] presents for follow-up of hypertension, vitamin D deficiency and obesity.  Has been on a 1500-calorie diet has been difficult for her is interested in trying a new drug Plenity.  Blood pressure is well controlled with Lotensin.  She continues vitamin D supplementation of 2000 international units daily.  She has not seen weight loss management because her high deductible started again.  She remains active with gardening.  Past Medical History:  Diagnosis Date  . Anemia   . Arthritis   . B12 deficiency   . CARPAL TUNNEL SYNDROME, BILATERAL 08/04/2010  . GERD 03/06/2008  . HYPERTENSION 02/10/2007  . Obesity   . OBESITY NOS 02/10/2007  . Sinusitis   . Swelling   . Vitamin D deficiency     Past Surgical History:  Procedure Laterality Date  . CARPAL TUNNEL RELEASE     Both hands  . HEMORRHOID SURGERY    . NASAL SINUS SURGERY    . TOTAL KNEE ARTHROPLASTY Left 10/16/2019   Procedure: LEFT TOTAL KNEE ARTHROPLASTY;  Surgeon: Tarry Kos, MD;  Location: [facility] OR;  Service: Orthopedics;  Laterality: Left;    Family History  Problem Relation Age of Onset  . Liver disease Mother   . Hypertension Father     Social History   Socioeconomic History  . Marital status: Married    Spouse name: Not on file  . Number of children: Not on file  . Years of education: Not on file  . Highest education level: Not on file  Occupational History  . Not on file  Tobacco Use  . Smoking status: Never Smoker  . Smokeless tobacco: Never Used  Substance and Sexual Activity  . Alcohol use: Yes    Comment: rarley  . Drug use: No  . Sexual activity: Not on file  Other Topics Concern  . Not on file  Social History Narrative  . Not on file   Social Determinants of Health   Financial  Resource Strain: Not on file  Food Insecurity: Not on file  Transportation Needs: Not on file  Physical Activity: Not on file  Stress: Not on file  Social Connections: Not on file  Intimate Partner Violence: Not on file    Outpatient Medications Prior to Visit  Medication Sig Dispense Refill  . amoxicillin (AMOXIL) 500 MG capsule Take 4 tabs one hour prior to dental work 12 capsule 0  . benazepril (LOTENSIN) 20 MG tablet TAKE 1 TABLET BY MOUTH EVERY DAY 90 tablet 0  . cetirizine-pseudoephedrine (ZYRTEC-D) 5-120 MG tablet Take 1 tablet by mouth daily as needed for allergies.    . cholecalciferol (VITAMIN D3) 25 MCG (1000 UNIT) tablet Take 1,000 Units by mouth daily.    Marland Kitchen FERROUS SULFATE PO Take 1 tablet by mouth daily.     Marland Kitchen ibuprofen (ADVIL) 200 MG tablet Take 400 mg by mouth every 6 (six) hours as needed.    . Multiple Vitamin (MULTIVITAMIN WITH MINERALS) TABS tablet Take 1 tablet by mouth daily.    . VOLTAREN 1 % GEL APPLY 2 GRAMS TO AFFECTED AREA 4 TIMES A DAY 100 g 5  . ibuprofen (ADVIL) 800 MG tablet Take 1 tablet (800 mg total) by mouth every 8 (eight) hours as needed.  90 tablet 0  . Vitamin D, Ergocalciferol, (DRISDOL) 1.25 MG (50000 UNIT) CAPS capsule Take 1 capsule (50,000 Units total) by mouth every 7 (seven) days. 4 capsule 0   No facility-administered medications prior to visit.    Allergies  Allergen Reactions  . Sulfamethoxazole Hives    ROS Review of Systems  Constitutional: Negative.   HENT: Negative.   Respiratory: Negative.   Cardiovascular: Negative.   Gastrointestinal: Negative.   Endocrine: Negative for polyphagia and polyuria.  Genitourinary: Negative.   Musculoskeletal: Negative for gait problem and joint swelling.  Skin: Negative for pallor and rash.  Allergic/Immunologic: Negative for immunocompromised state.  Neurological: Negative for speech difficulty and weakness.  Hematological: Does not bruise/bleed easily.  Psychiatric/Behavioral: Negative.        Objective:    Physical Exam Vitals and nursing note reviewed.  Constitutional:      General: She is not in acute distress.    Appearance: Normal appearance. She is not ill-appearing, toxic-appearing or diaphoretic.  HENT:     Head: Normocephalic and atraumatic.     Right Ear: Tympanic membrane, ear canal and external ear normal.     Left Ear: Tympanic membrane, ear canal and external ear normal.     Mouth/Throat:     Mouth: Mucous membranes are moist.     Pharynx: Oropharynx is clear. No oropharyngeal exudate or posterior oropharyngeal erythema.  Eyes:     General: No scleral icterus.       Right eye: No discharge.        Left eye: No discharge.     Extraocular Movements: Extraocular movements intact.     Conjunctiva/sclera: Conjunctivae normal.     Pupils: Pupils are equal, round, and reactive to light.  Cardiovascular:     Rate and Rhythm: Normal rate and regular rhythm.  Pulmonary:     Effort: Pulmonary effort is normal.     Breath sounds: Normal breath sounds.  Abdominal:     General: Bowel sounds are normal.  Musculoskeletal:     Cervical back: No rigidity or tenderness.     Right lower leg: No edema.     Left lower leg: No edema.  Lymphadenopathy:     Cervical: No cervical adenopathy.  Skin:    General: Skin is warm and dry.  Neurological:     Mental Status: She is alert and oriented to person, place, and time.  Psychiatric:        Mood and Affect: Mood normal.        Behavior: Behavior normal.     BP 120/78 (BP Location: Left Arm, Patient Position: Sitting, Cuff Size: Large)   Pulse 77   Temp (!) 97.5 F (36.4 C) (Temporal)   Ht 5\' 4"  (1.626 m)   Wt 220 lb 4 oz (99.9 kg)   LMP 03/14/2014 (Approximate) Comment: perimenopause  BMI 37.81 kg/m  Wt Readings from Last 3 Encounters:  02/14/21 220 lb 4 oz (99.9 kg)  09/03/20 217 lb (98.4 kg)  08/05/20 219 lb (99.3 kg)     Health Maintenance Due  Topic Date Due  . HIV Screening  Never done  .  Hepatitis C Screening  Never done  . MAMMOGRAM  02/27/2019  . PAP SMEAR-Modifier  02/26/2021    There are no preventive care reminders to display for this patient.  Lab Results  Component Value Date   TSH 1.290 05/30/2020   Lab Results  Component Value Date   WBC 4.5 05/30/2020   HGB 13.0  05/30/2020   HCT 40.6 05/30/2020   MCV 93 05/30/2020   PLT 335 05/30/2020   Lab Results  Component Value Date   NA 142 05/30/2020   K 4.5 05/30/2020   CO2 24 05/30/2020   GLUCOSE 84 05/30/2020   BUN 14 05/30/2020   CREATININE 0.60 05/30/2020   BILITOT 0.4 05/30/2020   ALKPHOS 103 05/30/2020   AST 15 05/30/2020   ALT 13 05/30/2020   PROT 6.9 05/30/2020   ALBUMIN 4.5 05/30/2020   CALCIUM 9.2 05/30/2020   ANIONGAP 10 10/12/2019   GFR 105.71 02/24/2019   Lab Results  Component Value Date   CHOL 178 05/30/2020   Lab Results  Component Value Date   HDL 67 05/30/2020   Lab Results  Component Value Date   LDLCALC 103 (H) 05/30/2020   Lab Results  Component Value Date   TRIG 39 05/30/2020   Lab Results  Component Value Date   CHOLHDL 2 02/24/2019   Lab Results  Component Value Date   HGBA1C 5.3 05/30/2020      Assessment & Plan:   Problem List Items Addressed This Visit      Cardiovascular and Mediastinum   Essential hypertension   Relevant Orders   Basic metabolic panel   Urinalysis, Routine w reflex microscopic     Other   Class 2 severe obesity with serious comorbidity and body mass index (BMI) of 36.0 to 36.9 in adult (HCC)   Relevant Medications   Carboxymeth-Cellulose-CitricAc (PLENITY) CAPS   Vitamin D deficiency - Primary   Relevant Orders   VITAMIN D 25 Hydroxy (Vit-D Deficiency, Fractures)    Other Visit Diagnoses    Healthcare maintenance       Relevant Orders   Ambulatory referral to Gastroenterology      Meds ordered this encounter  Medications  . Carboxymeth-Cellulose-CitricAc (PLENITY) CAPS    Sig: Take 1 capsule by mouth 2 (two) times  daily before lunch and supper. Drink a tall glass of water with tablet and after meal.    Dispense:  60 capsule    Refill:  5    Follow-up: Return in about 3 months (around 05/17/2021).    Mliss Sax, MD

## 2021-02-14 NOTE — Telephone Encounter (Signed)
Patient thinks she is due for a colonoscopy. Her last one was at 60 yo. She is asking if a referral can be sent. She does not have a preference.

## 2021-02-18 ENCOUNTER — Other Ambulatory Visit: Payer: Self-pay | Admitting: Family Medicine

## 2021-02-18 DIAGNOSIS — I10 Essential (primary) hypertension: Secondary | ICD-10-CM

## 2021-03-27 ENCOUNTER — Telehealth (INDEPENDENT_AMBULATORY_CARE_PROVIDER_SITE_OTHER): Payer: BC Managed Care – PPO | Admitting: Family Medicine

## 2021-03-27 ENCOUNTER — Telehealth: Payer: Self-pay

## 2021-03-27 ENCOUNTER — Other Ambulatory Visit: Payer: Self-pay

## 2021-03-27 ENCOUNTER — Encounter: Payer: Self-pay | Admitting: Family Medicine

## 2021-03-27 VITALS — BP 120/78 | Temp 96.4°F | Ht 64.0 in | Wt 220.0 lb

## 2021-03-27 DIAGNOSIS — B349 Viral infection, unspecified: Secondary | ICD-10-CM | POA: Insufficient documentation

## 2021-03-27 MED ORDER — MOMETASONE FUROATE 50 MCG/ACT NA SUSP: 2.0000 | Freq: Every day | NASAL | 2 refills | Status: DC

## 2021-03-27 NOTE — Progress Notes (Signed)
Established Patient Office Visit  Subjective:  Patient ID: Hailey [name], female    DOB: 07-10-1961  Age: 60 y.o. MRN: [id number]  CC:  Chief Complaint  Patient presents with   Sinus Problem    Cough, runny nose, sinus headache, green mucus with cough    HPI Hailey [name] presents for evaluation of a 3-day history of nasal congestion postnasal drip cough malaise and frontal headache.  She denies fevers chills, alteration in taste or smell, wheezing, difficulty breathing, diarrhea.  She has completed her COVID vaccination series in 2 boosters.  Home COVID test was negative.  She does have a history of sinus surgery.  Past Medical History:  Diagnosis Date   Anemia    Arthritis    B12 deficiency    CARPAL TUNNEL SYNDROME, BILATERAL 08/04/2010   GERD 03/06/2008   HYPERTENSION 02/10/2007   Obesity    OBESITY NOS 02/10/2007   Sinusitis    Swelling    Vitamin D deficiency     Past Surgical History:  Procedure Laterality Date   CARPAL TUNNEL RELEASE     Both hands   HEMORRHOID SURGERY     NASAL SINUS SURGERY     TOTAL KNEE ARTHROPLASTY Left 10/16/2019   Procedure: LEFT TOTAL KNEE ARTHROPLASTY;  Surgeon: Tarry Kos, MD;  Location: [facility] OR;  Service: Orthopedics;  Laterality: Left;    Family History  Problem Relation Age of Onset   Liver disease Mother    Hypertension Father     Social History   Socioeconomic History   Marital status: Married    Spouse name: Not on file   Number of children: Not on file   Years of education: Not on file   Highest education level: Not on file  Occupational History   Not on file  Tobacco Use   Smoking status: Never   Smokeless tobacco: Never  Vaping Use   Vaping Use: Never used  Substance and Sexual Activity   Alcohol use: Yes    Comment: rarley   Drug use: No   Sexual activity: Not on file  Other Topics Concern   Not on file  Social History Narrative   Not on file   Social Determinants of Health   Financial Resource  Strain: Not on file  Food Insecurity: Not on file  Transportation Needs: Not on file  Physical Activity: Not on file  Stress: Not on file  Social Connections: Not on file  Intimate Partner Violence: Not on file    Outpatient Medications Prior to Visit  Medication Sig Dispense Refill   benazepril (LOTENSIN) 20 MG tablet TAKE 1 TABLET BY MOUTH EVERY DAY 90 tablet 2   cetirizine-pseudoephedrine (ZYRTEC-D) 5-120 MG tablet Take 1 tablet by mouth daily as needed for allergies.     cholecalciferol (VITAMIN D3) 25 MCG (1000 UNIT) tablet Take 1,000 Units by mouth daily.     FERROUS SULFATE PO Take 1 tablet by mouth daily.      ibuprofen (ADVIL) 200 MG tablet Take 400 mg by mouth every 6 (six) hours as needed.     Multiple Vitamin (MULTIVITAMIN WITH MINERALS) TABS tablet Take 1 tablet by mouth daily.     amoxicillin (AMOXIL) 500 MG capsule Take 4 tabs one hour prior to dental work (Patient not taking: Reported on 03/27/2021) 12 capsule 0   Carboxymeth-Cellulose-CitricAc (PLENITY) CAPS Take 1 capsule by mouth 2 (two) times daily before lunch and supper. Drink a tall glass of water with tablet and  after meal. (Patient not taking: No sig reported) 60 capsule 5   No facility-administered medications prior to visit.    Allergies  Allergen Reactions   Sulfamethoxazole Hives    ROS Review of Systems  Constitutional:  Negative for chills, diaphoresis, fatigue, fever and unexpected weight change.  HENT:  Positive for congestion, postnasal drip, rhinorrhea and sinus pain. Negative for voice change.   Eyes:  Negative for photophobia and visual disturbance.  Respiratory:  Positive for cough. Negative for shortness of breath and wheezing.   Gastrointestinal:  Negative for diarrhea.  Musculoskeletal:  Negative for arthralgias and myalgias.  Psychiatric/Behavioral: Negative.       Objective:    Physical Exam Vitals and nursing note reviewed.  Constitutional:      General: She is not in acute  distress.    Appearance: Normal appearance. She is not ill-appearing, toxic-appearing or diaphoretic.  HENT:     Head: Normocephalic and atraumatic.     Right Ear: External ear normal.     Left Ear: External ear normal.  Eyes:     General: No scleral icterus.       Right eye: No discharge.        Left eye: No discharge.     Conjunctiva/sclera: Conjunctivae normal.  Pulmonary:     Effort: Pulmonary effort is normal.  Skin:    General: Skin is warm and dry.  Neurological:     Mental Status: She is alert and oriented to person, place, and time.  Psychiatric:        Mood and Affect: Mood normal.        Behavior: Behavior normal.    BP 120/78   Temp (!) 96.4 F (35.8 C)   Ht 5\' 4"  (1.626 m)   Wt 220 lb (99.8 kg)   LMP 03/14/2014 (Approximate) Comment: perimenopause  BMI 37.76 kg/m  Wt Readings from Last 3 Encounters:  03/27/21 220 lb (99.8 kg)  02/14/21 220 lb 4 oz (99.9 kg)  09/03/20 217 lb (98.4 kg)     Health Maintenance Due  Topic Date Due   HIV Screening  Never done   Hepatitis C Screening  Never done   MAMMOGRAM  02/27/2019   PAP SMEAR-Modifier  02/26/2021    There are no preventive care reminders to display for this patient.  Lab Results  Component Value Date   TSH 1.290 05/30/2020   Lab Results  Component Value Date   WBC 4.5 05/30/2020   HGB 13.0 05/30/2020   HCT 40.6 05/30/2020   MCV 93 05/30/2020   PLT 335 05/30/2020   Lab Results  Component Value Date   NA 141 02/14/2021   K 4.5 02/14/2021   CO2 29 02/14/2021   GLUCOSE 78 02/14/2021   BUN 22 02/14/2021   CREATININE 0.60 02/14/2021   BILITOT 0.4 05/30/2020   ALKPHOS 103 05/30/2020   AST 15 05/30/2020   ALT 13 05/30/2020   PROT 6.9 05/30/2020   ALBUMIN 4.5 05/30/2020   CALCIUM 9.2 02/14/2021   ANIONGAP 10 10/12/2019   GFR 97.82 02/14/2021   Lab Results  Component Value Date   CHOL 178 05/30/2020   Lab Results  Component Value Date   HDL 67 05/30/2020   Lab Results  Component  Value Date   LDLCALC 103 (H) 05/30/2020   Lab Results  Component Value Date   TRIG 39 05/30/2020   Lab Results  Component Value Date   CHOLHDL 2 02/24/2019   Lab Results  Component Value  Date   HGBA1C 5.3 05/30/2020      Assessment & Plan:   Problem List Items Addressed This Visit       Other   Viral syndrome - Primary   Relevant Medications   mometasone (NASONEX) 50 MCG/ACT nasal spray    Meds ordered this encounter  Medications   mometasone (NASONEX) 50 MCG/ACT nasal spray    Sig: Place 2 sprays into the nose daily.    Dispense:  1 each    Refill:  2    Follow-up: No follow-ups on file.   Patient will stay home from work and rest for the next 3 to 4 days.  She will drink plenty of fluids and continue Mucinex.  She will start Nasonex nose spray and use for least 10 days.  She will call me on Tuesday or Wednesday if not improving. Mliss Sax, MD  Virtual Visit via Video Note  I connected with Hailey [name] on 03/27/21 at  4:00 PM EDT by a video enabled telemedicine application and verified that I am speaking with the correct person using two identifiers.  Location: Patient: home alone Provider: work   I discussed the limitations of evaluation and management by telemedicine and the availability of in person appointments. The patient expressed understanding and agreed to proceed.  History of Present Illness:    Observations/Objective:   Assessment and Plan:   Follow Up Instructions:    I discussed the assessment and treatment plan with the patient. The patient was provided an opportunity to ask questions and all were answered. The patient agreed with the plan and demonstrated an understanding of the instructions.   The patient was advised to call back or seek an in-person evaluation if the symptoms worsen or if the condition fails to improve as anticipated.  I provided 25 minutes of non-face-to-face time during this  encounter.   Mliss Sax, MD

## 2021-03-27 NOTE — Telephone Encounter (Signed)
Hailey [name] will be dropping off forms for the RX Carboxmethycellulose-Citric . Patient stated she can not find a local  pharmacy with the product. So she will have to present the Rx to the manufacture.   Patient advised to fill out her personal  information and we can have it faxed.

## 2021-04-02 ENCOUNTER — Telehealth: Payer: Self-pay

## 2021-04-02 DIAGNOSIS — J019 Acute sinusitis, unspecified: Secondary | ICD-10-CM

## 2021-04-02 NOTE — Telephone Encounter (Signed)
Waiting on forms to be dropped off

## 2021-04-02 NOTE — Telephone Encounter (Signed)
Please advise message below, patient calling for antibiotics to be sent in for continued symptoms OV on 03/27/21.

## 2021-04-02 NOTE — Telephone Encounter (Signed)
Pt had video visit with Dr Doreene Burke on 03/27/21. She is still having the symptoms of a sinus infection. Sore throat, head congestion and green colored mucus.  Her ENT retired. She is scheduled with his replacement but cannot get in before August. Pt is asking if abx can be called in, if not advise to reduce the discomfort.  Thanks

## 2021-04-03 MED ORDER — AMOXICILLIN 500 MG PO CAPS: 500.0000 mg | ORAL_CAPSULE | Freq: Three times a day (TID) | ORAL | 0 refills | Status: AC

## 2021-04-03 NOTE — Telephone Encounter (Signed)
Patient aware and will pick Rx up today.

## 2021-05-09 ENCOUNTER — Ambulatory Visit (AMBULATORY_SURGERY_CENTER): Payer: BC Managed Care – PPO | Admitting: *Deleted

## 2021-05-09 ENCOUNTER — Other Ambulatory Visit: Payer: Self-pay

## 2021-05-09 VITALS — Ht 64.0 in | Wt 220.0 lb

## 2021-05-09 DIAGNOSIS — Z1211 Encounter for screening for malignant neoplasm of colon: Secondary | ICD-10-CM

## 2021-05-09 NOTE — Progress Notes (Signed)
Pt's previsit is done over the phone and all paperwork (prep instructions, blank consent form to just read over) sent to patient.  Pt's name and DOB verified at the beginning of the previsit.  Pt denies any difficulty with ambulating.    No trouble with anesthesia, denies being told they were difficult to intubate, or hx/fam hx of malignant hyperthermia per pt   No egg or soy allergy  No home oxygen use   No medications for weight loss taken  emmi information given  Pt denies constipation issues  Pt informed that we do not do prior authorizations for prep   

## 2021-05-12 ENCOUNTER — Ambulatory Visit (INDEPENDENT_AMBULATORY_CARE_PROVIDER_SITE_OTHER): Payer: BC Managed Care – PPO | Admitting: Otolaryngology

## 2021-05-15 ENCOUNTER — Ambulatory Visit (INDEPENDENT_AMBULATORY_CARE_PROVIDER_SITE_OTHER): Payer: BC Managed Care – PPO | Admitting: Otolaryngology

## 2021-05-15 ENCOUNTER — Other Ambulatory Visit: Payer: Self-pay

## 2021-05-15 DIAGNOSIS — Z8709 Personal history of other diseases of the respiratory system: Secondary | ICD-10-CM | POA: Diagnosis not present

## 2021-05-15 DIAGNOSIS — J310 Chronic rhinitis: Secondary | ICD-10-CM | POA: Diagnosis not present

## 2021-05-15 NOTE — Progress Notes (Signed)
HPI: Hailey [name] is a 60 y.o. female who presents is referred by her PCP for evaluation of her "sinuses".  She already seems to have problems in the spring and fall as she works outside with a garden.  She generally takes Zyrtec dB and Mucinex.  She occasionally has sinus infections and has previously been treated with amoxicillin and Z-Pak.  She has had previous surgeries with Dr. Haroldine Laws x3.  She was doing worse several weeks ago but is doing much better today.  She does not feel like she needs any further antibiotics at this point. She has used saline rinses but does not like to use nasal steroid sprays..  Past Medical History:  Diagnosis Date   Allergy    Anemia    Arthritis    B12 deficiency    CARPAL TUNNEL SYNDROME, BILATERAL 08/04/2010   GERD 03/06/2008   HYPERTENSION 02/10/2007   Obesity    OBESITY NOS 02/10/2007   Sinusitis    Swelling    Vitamin D deficiency    Past Surgical History:  Procedure Laterality Date   CARPAL TUNNEL RELEASE     Both hands   COLONOSCOPY     2012 at [facility]   HEMORRHOID SURGERY     NASAL SINUS SURGERY     TOTAL KNEE ARTHROPLASTY Left 10/16/2019   Procedure: LEFT TOTAL KNEE ARTHROPLASTY;  Surgeon: Tarry Kos, MD;  Location: [facility] OR;  Service: Orthopedics;  Laterality: Left;   Social History   Socioeconomic History   Marital status: Married    Spouse name: Not on file   Number of children: Not on file   Years of education: Not on file   Highest education level: Not on file  Occupational History   Not on file  Tobacco Use   Smoking status: Never   Smokeless tobacco: Never  Vaping Use   Vaping Use: Never used  Substance and Sexual Activity   Alcohol use: Yes    Comment: rarely   Drug use: No   Sexual activity: Not on file  Other Topics Concern   Not on file  Social History Narrative   Not on file   Social Determinants of Health   Financial Resource Strain: Not on file  Food Insecurity: Not on file  Transportation Needs:  Not on file  Physical Activity: Not on file  Stress: Not on file  Social Connections: Not on file   Family History  Problem Relation Age of Onset   Liver disease Mother    Hypertension Father    Colon cancer Neg Hx    Esophageal cancer Neg Hx    Rectal cancer Neg Hx    Stomach cancer Neg Hx    Allergies  Allergen Reactions   Sulfamethoxazole Hives   Prior to Admission medications   Medication Sig Start Date End Date Taking? Authorizing Provider  amoxicillin (AMOXIL) 500 MG capsule Take 4 tabs one hour prior to dental work Patient not taking: No sig reported 01/23/21   Cristie Hem, PA-C  benazepril (LOTENSIN) 20 MG tablet TAKE 1 TABLET BY MOUTH EVERY DAY 02/18/21   Mliss Sax, MD  Carboxymeth-Cellulose-CitricAc (PLENITY) CAPS Take 1 capsule by mouth 2 (two) times daily before lunch and supper. Drink a tall glass of water with tablet and after meal. Patient not taking: No sig reported 02/14/21   Mliss Sax, MD  cetirizine-pseudoephedrine (ZYRTEC-D) 5-120 MG tablet Take 1 tablet by mouth daily as needed for allergies.    [provider]  cholecalciferol (VITAMIN D3) 25 MCG (1000 UNIT) tablet Take 1,000 Units by mouth daily.    [provider]  FERROUS SULFATE PO Take 1 tablet by mouth daily.     [provider]  ibuprofen (ADVIL) 200 MG tablet Take 400 mg by mouth every 6 (six) hours as needed.    [provider]  mometasone (NASONEX) 50 MCG/ACT nasal spray Place 2 sprays into the nose daily. Patient not taking: Reported on 05/09/2021 03/27/21   Mliss Sax, MD  Multiple Vitamin (MULTIVITAMIN WITH MINERALS) TABS tablet Take 1 tablet by mouth daily.    [provider]     Positive ROS: Otherwise negative  All other systems have been reviewed and were otherwise negative with the exception of those mentioned in the HPI and as above.  Physical Exam: Constitutional: Alert, well-appearing, no acute  distress Ears: External ears without lesions or tenderness. Ear canals are clear bilaterally with intact, clear TMs.  Nasal: External nose without lesions. Septum relatively midline with mild rhinitis.  After decongesting the nose both middle meatus regions were clear with no mucopurulent discharge noted..  Oral: Lips and gums without lesions. Tongue and palate mucosa without lesions. Posterior oropharynx clear. Neck: No palpable adenopathy or masses Respiratory: Breathing comfortably  Skin: No facial/neck lesions or rash noted.  Procedures  Assessment: Chronic rhinitis with history of previous sinus infections.  Plan: Recommended regular use of saline rinses when she is having problems as well as use of the Mucinex as needed. She will call us if she has any further sinus infections for antibiotics as needed.  But presently no clinical evidence of active sinus infection.   Narda Bonds, MD   CC:

## 2021-05-21 ENCOUNTER — Encounter: Payer: Self-pay | Admitting: Gastroenterology

## 2021-05-23 ENCOUNTER — Other Ambulatory Visit: Payer: Self-pay

## 2021-05-23 ENCOUNTER — Ambulatory Visit (AMBULATORY_SURGERY_CENTER): Payer: BC Managed Care – PPO | Admitting: Gastroenterology

## 2021-05-23 ENCOUNTER — Encounter: Payer: Self-pay | Admitting: Gastroenterology

## 2021-05-23 VITALS — BP 125/75 | HR 64 | Temp 97.3°F | Resp 12 | Ht 64.0 in | Wt 220.0 lb

## 2021-05-23 DIAGNOSIS — Z1211 Encounter for screening for malignant neoplasm of colon: Secondary | ICD-10-CM | POA: Diagnosis not present

## 2021-05-23 MED ORDER — SODIUM CHLORIDE 0.9 % IV SOLN: 500.0000 mL | INTRAVENOUS | Status: DC

## 2021-05-23 NOTE — Patient Instructions (Signed)
YOU HAD AN ENDOSCOPIC PROCEDURE TODAY AT THE [facility]:   Refer to the procedure report that was given to you for any specific questions about what was found during the examination.  If the procedure report does not answer your questions, please call your gastroenterologist to clarify.  If you requested that your care partner not be given the details of your procedure findings, then the procedure report has been included in a sealed envelope for you to review at your convenience later.  **Handouts given on Hemorrhoids and Diverticulosis**  YOU SHOULD EXPECT: Some feelings of bloating in the abdomen. Passage of more gas than usual.  Walking can help get rid of the air that was put into your GI tract during the procedure and reduce the bloating. If you had a lower endoscopy (such as a colonoscopy or flexible sigmoidoscopy) you may notice spotting of blood in your stool or on the toilet paper. If you underwent a bowel prep for your procedure, you may not have a normal bowel movement for a few days.  Please Note:  You might notice some irritation and congestion in your nose or some drainage.  This is from the oxygen used during your procedure.  There is no need for concern and it should clear up in a day or so.  SYMPTOMS TO REPORT IMMEDIATELY:  Following lower endoscopy (colonoscopy or flexible sigmoidoscopy):  Excessive amounts of blood in the stool  Significant tenderness or worsening of abdominal pains  Swelling of the abdomen that is new, acute  Fever of 100F or higher  For urgent or emergent issues, a gastroenterologist can be reached at any hour by calling [phone]. Do not use MyChart messaging for urgent concerns.    DIET:  We do recommend a small meal at first, but then you may proceed to your regular diet.  Drink plenty of fluids but you should avoid alcoholic beverages for 24 hours.  ACTIVITY:  You should plan to take it easy for the rest of today and you should NOT  DRIVE or use heavy machinery until tomorrow (because of the sedation medicines used during the test).    FOLLOW UP: Our staff will call the number listed on your records 48-72 hours following your procedure to check on you and address any questions or concerns that you may have regarding the information given to you following your procedure. If we do not reach you, we will leave a message.  We will attempt to reach you two times.  During this call, we will ask if you have developed any symptoms of COVID 19. If you develop any symptoms (ie: fever, flu-like symptoms, shortness of breath, cough etc.) before then, please call [phone].  If you test positive for Covid 19 in the 2 weeks post procedure, please call and report this information to [place].    If any biopsies were taken you will be contacted by phone or by letter within the next 1-3 weeks.  Please call us at [phone] if you have not heard about the biopsies in 3 weeks.    SIGNATURES/CONFIDENTIALITY: You and/or your care partner have signed paperwork which will be entered into your electronic medical record.  These signatures attest to the fact that that the information above on your After Visit Summary has been reviewed and is understood.  Full responsibility of the confidentiality of this discharge information lies with you and/or your care-partner.

## 2021-05-23 NOTE — Progress Notes (Signed)
Vs CW I have reviewed the patient's medical history in detail and updated the computerized patient record.   

## 2021-05-23 NOTE — Progress Notes (Signed)
HPI: This is a woman at routine risk for colon cancer   ROS: complete GI ROS as described in HPI, all other review negative.  Constitutional:  No unintentional weight loss   Past Medical History:  Diagnosis Date   Allergy    Anemia    Arthritis    B12 deficiency    CARPAL TUNNEL SYNDROME, BILATERAL 08/04/2010   GERD 03/06/2008   HYPERTENSION 02/10/2007   Obesity    OBESITY NOS 02/10/2007   Sinusitis    Swelling    Vitamin D deficiency     Past Surgical History:  Procedure Laterality Date   CARPAL TUNNEL RELEASE     Both hands   COLONOSCOPY     2012 at [facility]   HEMORRHOID SURGERY     NASAL SINUS SURGERY     TOTAL KNEE ARTHROPLASTY Left 10/16/2019   Procedure: LEFT TOTAL KNEE ARTHROPLASTY;  Surgeon: Tarry Kos, MD;  Location: [facility] OR;  Service: Orthopedics;  Laterality: Left;    Current Outpatient Medications  Medication Sig Dispense Refill   benazepril (LOTENSIN) 20 MG tablet TAKE 1 TABLET BY MOUTH EVERY DAY 90 tablet 2   cetirizine-pseudoephedrine (ZYRTEC-D) 5-120 MG tablet Take 1 tablet by mouth daily as needed for allergies.     cholecalciferol (VITAMIN D3) 25 MCG (1000 UNIT) tablet Take 1,000 Units by mouth daily.     FERROUS SULFATE PO Take 1 tablet by mouth daily.      ibuprofen (ADVIL) 200 MG tablet Take 400 mg by mouth every 6 (six) hours as needed.     Multiple Vitamin (MULTIVITAMIN WITH MINERALS) TABS tablet Take 1 tablet by mouth daily.     Current Facility-Administered Medications  Medication Dose Route Frequency Provider Last Rate Last Admin   0.9 %  sodium chloride infusion  500 mL Intravenous Continuous Rachael Fee, MD        Allergies as of 05/23/2021 - Review Complete 05/23/2021  Allergen Reaction Noted   Sulfamethoxazole Hives 02/09/2007    Family History  Problem Relation Age of Onset   Liver disease Mother    Hypertension Father    Colon cancer Neg Hx    Esophageal cancer Neg Hx    Rectal cancer Neg Hx    Stomach cancer Neg Hx      Social History   Socioeconomic History   Marital status: Married    Spouse name: Not on file   Number of children: Not on file   Years of education: Not on file   Highest education level: Not on file  Occupational History   Not on file  Tobacco Use   Smoking status: Never   Smokeless tobacco: Never  Vaping Use   Vaping Use: Never used  Substance and Sexual Activity   Alcohol use: Yes    Comment: rarely   Drug use: No   Sexual activity: Not on file  Other Topics Concern   Not on file  Social History Narrative   Not on file   Social Determinants of Health   Financial Resource Strain: Not on file  Food Insecurity: Not on file  Transportation Needs: Not on file  Physical Activity: Not on file  Stress: Not on file  Social Connections: Not on file  Intimate Partner Violence: Not on file     Physical Exam: BP 132/83   Pulse 72   Temp (!) 97.3 F (36.3 C)   Ht 5\' 4"  (1.626 m)   Wt 220 lb (99.8 kg)   LMP  03/14/2014 (Approximate) Comment: perimenopause  SpO2 100%   BMI 37.76 kg/m  Constitutional: generally well-appearing Psychiatric: alert and oriented x3 Lungs: CTA bilaterally Heart: no MCR  Assessment and plan: 60 y.o. female with reoutine risk for colon cancer  Screening colonoscopy today  Care is appropriate for the ambulatory setting.  Rob Bunting, MD [facility] 05/23/2021, 8:47 AM

## 2021-05-23 NOTE — Progress Notes (Signed)
Report to PACU, RN, vss, BBS= Clear.  

## 2021-05-23 NOTE — Op Note (Signed)
[facility] Patient Name: Hailey [name] Procedure Date: 05/23/2021 8:48 AM MRN: [id number] Endoscopist: Rachael Fee , MD Age: 60 Referring MD:  Date of Birth: 1961-03-24 Gender: Female Account #: [id number] Procedure:                Colonoscopy Indications:              Screening for colorectal malignant neoplasm Medicines:                Monitored Anesthesia Care Procedure:                Pre-Anesthesia Assessment:                           - Prior to the procedure, a History and Physical                            was performed, and patient medications and                            allergies were reviewed. The patient's tolerance of                            previous anesthesia was also reviewed. The risks                            and benefits of the procedure and the sedation                            options and risks were discussed with the patient.                            All questions were answered, and informed consent                            was obtained. Prior Anticoagulants: The patient has                            taken no previous anticoagulant or antiplatelet                            agents. ASA Grade Assessment: II - A patient with                            mild systemic disease. After reviewing the risks                            and benefits, the patient was deemed in                            satisfactory condition to undergo the procedure.                           After obtaining informed consent, the colonoscope  was passed under direct vision. Throughout the                            procedure, the patient's blood pressure, pulse, and                            oxygen saturations were monitored continuously. The                            Olympus CF-HQ190L (Serial# [id number]) Colonoscope was                            introduced through the anus and advanced to the the                            cecum,  identified by appendiceal orifice and                            ileocecal valve. The colonoscopy was performed                            without difficulty. The patient tolerated the                            procedure well. The quality of the bowel                            preparation was good. The ileocecal valve,                            appendiceal orifice, and rectum were photographed. Scope In: 8:52:45 AM Scope Out: 9:11:55 AM Scope Withdrawal Time: 0 hours 14 minutes 55 seconds  Total Procedure Duration: 0 hours 19 minutes 10 seconds  Findings:                 Multiple small and large-mouthed diverticula were                            found in the left colon.                           External and internal hemorrhoids were found. The                            hemorrhoids were small.                           The exam was otherwise without abnormality on                            direct and retroflexion views. Complications:            No immediate complications. Estimated blood loss:                            None. Estimated  Blood Loss:     Estimated blood loss: none. Impression:               - Diverticulosis in the left colon.                           - External and internal hemorrhoids.                           - The examination was otherwise normal on direct                            and retroflexion views.                           - No polyps or cancers. Recommendation:           - Patient has a contact number available for                            emergencies. The signs and symptoms of potential                            delayed complications were discussed with the                            patient. Return to normal activities tomorrow.                            Written discharge instructions were provided to the                            patient.                           - Resume previous diet.                           - Continue present  medications.                           - Repeat colonoscopy in 10 years for screening. Rachael Fee, MD 05/23/2021 9:14:50 AM This report has been signed electronically.

## 2021-05-27 ENCOUNTER — Telehealth: Payer: Self-pay | Admitting: *Deleted

## 2021-05-27 NOTE — Telephone Encounter (Signed)
  Follow up Call-  Call back number 05/23/2021  Post procedure Call Back phone  # [phone]  Permission to leave phone message Yes  Some recent data might be hidden     Patient questions:  Do you have a fever, pain , or abdominal swelling? No. Pain Score  0 *  Have you tolerated food without any problems? Yes.    Have you been able to return to your normal activities? Yes.    Do you have any questions about your discharge instructions: Diet   No. Medications  No. Follow up visit  No.  Do you have questions or concerns about your Care? No.  Actions: * If pain score is 4 or above: No action needed, pain <4.  Have you developed a fever since your procedure? no  2.   Have you had an respiratory symptoms (SOB or cough) since your procedure? no  3.   Have you tested positive for COVID 19 since your procedure no  4.   Have you had any family members/close contacts diagnosed with the COVID 19 since your procedure?  no   If yes to any of these questions please route to Laverna Peace, RN and Karlton Lemon, RN

## 2021-05-27 NOTE — Telephone Encounter (Signed)
Follow up call made. 

## 2021-08-12 ENCOUNTER — Ambulatory Visit: Payer: BC Managed Care – PPO | Attending: Internal Medicine

## 2021-08-12 ENCOUNTER — Other Ambulatory Visit: Payer: Self-pay

## 2021-08-12 DIAGNOSIS — Z23 Encounter for immunization: Secondary | ICD-10-CM

## 2021-08-12 MED ORDER — PFIZER COVID-19 VAC BIVALENT 30 MCG/0.3ML IM SUSP
INTRAMUSCULAR | 0 refills | Status: DC
  Filled 2021-08-12: qty 0.3, 1d supply, fill #0

## 2021-10-03 ENCOUNTER — Telehealth: Payer: Self-pay | Admitting: Orthopaedic Surgery

## 2021-10-03 NOTE — Telephone Encounter (Signed)
Pt called requesting a sooner appt. Pt states her amputee stump is swollen and pained. Please call pt about this matter at [phone].

## 2021-10-06 NOTE — Telephone Encounter (Signed)
Can we clarify what is going on?  I'm not aware of an amputation.  Thanks.

## 2021-10-06 NOTE — Telephone Encounter (Signed)
I think we have only performed a total knee replacement on her?  Xu, do you know anything about the amputation or who may have done this?  Should we go ahead and have her come in tomorrow being that she has a tkr by [place]?

## 2021-10-08 NOTE — Telephone Encounter (Signed)
Called patient. No answer LMOM. She has not seen [place] since 2021.

## 2021-10-23 ENCOUNTER — Ambulatory Visit (INDEPENDENT_AMBULATORY_CARE_PROVIDER_SITE_OTHER): Payer: 59

## 2021-10-23 ENCOUNTER — Other Ambulatory Visit: Payer: Self-pay

## 2021-10-23 ENCOUNTER — Encounter: Payer: Self-pay | Admitting: Family Medicine

## 2021-10-23 ENCOUNTER — Ambulatory Visit: Payer: 59 | Admitting: Family Medicine

## 2021-10-23 VITALS — BP 115/72 | HR 80 | Temp 97.1°F | Ht 64.0 in | Wt 236.0 lb

## 2021-10-23 DIAGNOSIS — S29019A Strain of muscle and tendon of unspecified wall of thorax, initial encounter: Secondary | ICD-10-CM | POA: Diagnosis not present

## 2021-10-23 DIAGNOSIS — M5414 Radiculopathy, thoracic region: Secondary | ICD-10-CM

## 2021-10-23 DIAGNOSIS — Z6838 Body mass index (BMI) 38.0-38.9, adult: Secondary | ICD-10-CM

## 2021-10-23 MED ORDER — MELOXICAM 7.5 MG PO TABS: 7.5000 mg | ORAL_TABLET | Freq: Every day | ORAL | 0 refills | Status: DC

## 2021-10-23 MED ORDER — METHOCARBAMOL 500 MG PO TABS: 500.0000 mg | ORAL_TABLET | Freq: Three times a day (TID) | ORAL | 1 refills | Status: DC | PRN

## 2021-10-23 NOTE — Progress Notes (Signed)
Established Patient Office Visit  Subjective:  Patient ID: Hailey [name], female    DOB: Dec 30, 1960  Age: 61 y.o. MRN: [id number]  CC:  Chief Complaint  Patient presents with   Pain    Feels like pinched nerve on right side of back sometimes radiated to breast plate.     HPI Hailey [name] presents for evaluation for a 2-week history of pain in her right chest wall.  Pain radiates around the chest wall just underneath the right breast.  Denies any traumatic injury.  She has been engaged in heavy lifting prior to the onset of pain.  Denies rash.  She does feel that muscle spasms in this area.  She denies nausea and DOE.  Past Medical History:  Diagnosis Date   Allergy    Anemia    Arthritis    B12 deficiency    CARPAL TUNNEL SYNDROME, BILATERAL 08/04/2010   GERD 03/06/2008   HYPERTENSION 02/10/2007   Obesity    OBESITY NOS 02/10/2007   Sinusitis    Swelling    Vitamin D deficiency     Past Surgical History:  Procedure Laterality Date   CARPAL TUNNEL RELEASE     Both hands   COLONOSCOPY     2012 at [facility]   HEMORRHOID SURGERY     NASAL SINUS SURGERY     TOTAL KNEE ARTHROPLASTY Left 10/16/2019   Procedure: LEFT TOTAL KNEE ARTHROPLASTY;  Surgeon: Tarry Kos, MD;  Location: [facility] OR;  Service: Orthopedics;  Laterality: Left;    Family History  Problem Relation Age of Onset   Liver disease Mother    Hypertension Father    Colon cancer Neg Hx    Esophageal cancer Neg Hx    Rectal cancer Neg Hx    Stomach cancer Neg Hx     Social History   Socioeconomic History   Marital status: Married    Spouse name: Not on file   Number of children: Not on file   Years of education: Not on file   Highest education level: Not on file  Occupational History   Not on file  Tobacco Use   Smoking status: Never   Smokeless tobacco: Never  Vaping Use   Vaping Use: Never used  Substance and Sexual Activity   Alcohol use: Yes    Comment: rarely   Drug use: No   Sexual  activity: Not on file  Other Topics Concern   Not on file  Social History Narrative   Not on file   Social Determinants of Health   Financial Resource Strain: Not on file  Food Insecurity: Not on file  Transportation Needs: Not on file  Physical Activity: Not on file  Stress: Not on file  Social Connections: Not on file  Intimate Partner Violence: Not on file    Outpatient Medications Prior to Visit  Medication Sig Dispense Refill   benazepril (LOTENSIN) 20 MG tablet TAKE 1 TABLET BY MOUTH EVERY DAY 90 tablet 2   cetirizine-pseudoephedrine (ZYRTEC-D) 5-120 MG tablet Take 1 tablet by mouth daily as needed for allergies.     cholecalciferol (VITAMIN D3) 25 MCG (1000 UNIT) tablet Take 1,000 Units by mouth daily.     FERROUS SULFATE PO Take 1 tablet by mouth daily.      ibuprofen (ADVIL) 200 MG tablet Take 400 mg by mouth every 6 (six) hours as needed.     Multiple Vitamin (MULTIVITAMIN WITH MINERALS) TABS tablet Take 1 tablet by mouth daily.  COVID-19 mRNA bivalent vaccine, Pfizer, (PFIZER COVID-19 VAC BIVALENT) injection Inject into the muscle. 0.3 mL 0   No facility-administered medications prior to visit.    Allergies  Allergen Reactions   Sulfamethoxazole Hives    ROS Review of Systems  Constitutional:  Negative for diaphoresis, fatigue, fever and unexpected weight change.  HENT: Negative.    Eyes:  Negative for photophobia and visual disturbance.  Respiratory: Negative.  Negative for cough, chest tightness and shortness of breath.   Cardiovascular:  Positive for chest pain. Negative for palpitations and leg swelling.  Genitourinary: Negative.   Musculoskeletal:  Negative for neck pain and neck stiffness.  Neurological:  Negative for weakness and numbness.     Objective:    Physical Exam Vitals and nursing note reviewed.  Constitutional:      General: She is not in acute distress.    Appearance: Normal appearance. She is obese. She is not ill-appearing,  toxic-appearing or diaphoretic.  HENT:     Right Ear: Tympanic membrane, ear canal and external ear normal.     Left Ear: Tympanic membrane, ear canal and external ear normal.     Mouth/Throat:     Mouth: Mucous membranes are moist.     Pharynx: Oropharynx is clear. No oropharyngeal exudate or posterior oropharyngeal erythema.  Eyes:     General:        Right eye: No discharge.        Left eye: No discharge.     Extraocular Movements: Extraocular movements intact.     Conjunctiva/sclera: Conjunctivae normal.     Pupils: Pupils are equal, round, and reactive to light.  Cardiovascular:     Rate and Rhythm: Normal rate and regular rhythm.  Pulmonary:     Effort: Pulmonary effort is normal.     Breath sounds: Normal breath sounds.  Musculoskeletal:     Cervical back: No rigidity or tenderness.     Thoracic back: No spasms, tenderness or bony tenderness. Normal range of motion.  Lymphadenopathy:     Cervical: No cervical adenopathy.  Skin:    General: Skin is warm and dry.     Findings: Rash is not macular, papular or vesicular.  Neurological:     Mental Status: She is alert.     Motor: No weakness.    BP 115/72 (BP Location: Right Arm, Patient Position: Sitting, Cuff Size: Large)    Pulse 80    Temp (!) 97.1 F (36.2 C) (Temporal)    Ht 5\' 4"  (1.626 m)    Wt 236 lb (107 kg)    LMP 03/14/2014 (Approximate) Comment: perimenopause   SpO2 96%    BMI 40.51 kg/m  Wt Readings from Last 3 Encounters:  10/23/21 236 lb (107 kg)  05/23/21 220 lb (99.8 kg)  05/09/21 220 lb (99.8 kg)     Health Maintenance Due  Topic Date Due   HIV Screening  Never done   Hepatitis C Screening  Never done   MAMMOGRAM  02/27/2019    There are no preventive care reminders to display for this patient.  Lab Results  Component Value Date   TSH 1.290 05/30/2020   Lab Results  Component Value Date   WBC 4.5 05/30/2020   HGB 13.0 05/30/2020   HCT 40.6 05/30/2020   MCV 93 05/30/2020   PLT 335  05/30/2020   Lab Results  Component Value Date   NA 141 02/14/2021   K 4.5 02/14/2021   CO2 29 02/14/2021   GLUCOSE  78 02/14/2021   BUN 22 02/14/2021   CREATININE 0.60 02/14/2021   BILITOT 0.4 05/30/2020   ALKPHOS 103 05/30/2020   AST 15 05/30/2020   ALT 13 05/30/2020   PROT 6.9 05/30/2020   ALBUMIN 4.5 05/30/2020   CALCIUM 9.2 02/14/2021   ANIONGAP 10 10/12/2019   GFR 97.82 02/14/2021   Lab Results  Component Value Date   CHOL 178 05/30/2020   Lab Results  Component Value Date   HDL 67 05/30/2020   Lab Results  Component Value Date   LDLCALC 103 (H) 05/30/2020   Lab Results  Component Value Date   TRIG 39 05/30/2020   Lab Results  Component Value Date   CHOLHDL 2 02/24/2019   Lab Results  Component Value Date   HGBA1C 5.3 05/30/2020      Assessment & Plan:   Problem List Items Addressed This Visit       Nervous and Auditory   Thoracic radiculopathy   Relevant Medications   meloxicam (MOBIC) 7.5 MG tablet   methocarbamol (ROBAXIN) 500 MG tablet   Other Relevant Orders   DG Thoracic Spine W/Swimmers     Musculoskeletal and Integument   Thoracic myofascial strain - Primary   Relevant Medications   meloxicam (MOBIC) 7.5 MG tablet   methocarbamol (ROBAXIN) 500 MG tablet   Other Relevant Orders   DG Thoracic Spine W/Swimmers   Other Visit Diagnoses     Class 2 severe obesity due to excess calories with serious comorbidity and body mass index (BMI) of 38.0 to 38.9 in adult (HCC)           Meds ordered this encounter  Medications   meloxicam (MOBIC) 7.5 MG tablet    Sig: Take 1 tablet (7.5 mg total) by mouth daily.    Dispense:  30 tablet    Refill:  0   methocarbamol (ROBAXIN) 500 MG tablet    Sig: Take 1 tablet (500 mg total) by mouth every 8 (eight) hours as needed for muscle spasms.    Dispense:  30 tablet    Refill:  1    Follow-up: Return in about 1 month (around 11/23/2021), or if symptoms worsen or fail to improve.  Will take  meloxicam daily and use Robaxin as needed him.  Information was given about Ozempic.  Briefly discussed this medication with her.  Mliss SaxWilliam Alfred Kieryn Burtis, MD

## 2021-11-19 ENCOUNTER — Other Ambulatory Visit: Payer: Self-pay | Admitting: Family Medicine

## 2021-11-19 DIAGNOSIS — M5414 Radiculopathy, thoracic region: Secondary | ICD-10-CM

## 2021-11-19 DIAGNOSIS — S29019A Strain of muscle and tendon of unspecified wall of thorax, initial encounter: Secondary | ICD-10-CM

## 2021-11-22 ENCOUNTER — Other Ambulatory Visit: Payer: Self-pay | Admitting: Family Medicine

## 2021-11-22 DIAGNOSIS — S29019A Strain of muscle and tendon of unspecified wall of thorax, initial encounter: Secondary | ICD-10-CM

## 2021-11-22 DIAGNOSIS — M5414 Radiculopathy, thoracic region: Secondary | ICD-10-CM

## 2021-11-24 ENCOUNTER — Other Ambulatory Visit: Payer: Self-pay

## 2021-11-24 ENCOUNTER — Encounter: Payer: Self-pay | Admitting: Family Medicine

## 2021-11-24 ENCOUNTER — Ambulatory Visit: Payer: 59 | Admitting: Family Medicine

## 2021-11-24 VITALS — BP 122/68 | HR 76 | Temp 97.2°F | Ht 64.0 in | Wt 235.0 lb

## 2021-11-24 DIAGNOSIS — M8580 Other specified disorders of bone density and structure, unspecified site: Secondary | ICD-10-CM | POA: Diagnosis not present

## 2021-11-24 DIAGNOSIS — M5414 Radiculopathy, thoracic region: Secondary | ICD-10-CM | POA: Diagnosis not present

## 2021-11-24 MED ORDER — MELOXICAM 15 MG PO TABS: 15.0000 mg | ORAL_TABLET | Freq: Every day | ORAL | 1 refills | Status: DC

## 2021-11-24 MED ORDER — METHOCARBAMOL 750 MG PO TABS: 750.0000 mg | ORAL_TABLET | Freq: Four times a day (QID) | ORAL | 0 refills | Status: DC | PRN

## 2021-11-24 NOTE — Progress Notes (Signed)
Established Patient Office Visit  Subjective:  Patient ID: Hailey [name], female    DOB: [birth year]  Age: 61 y.o. MRN: [id number]  CC:  Chief Complaint  Patient presents with   Back Pain    C/O back spasms that come and go x 2 months no improvement from last visit last month.     HPI Hailey [name] presents for ongoing issue of mid back pain on the right side that radiates around her chest just underneath her breast.  She feels muscle spasms.  Lower doses of meloxicam and methocarbamol do not seem to be helping.  No injury.  She is right-hand dominant.  She does do some over shoulder lifting through her work.  Review of the plain films arthritis decreased disc space.  Radiologist noted osteopenia  Past Medical History:  Diagnosis Date   Allergy    Anemia    Arthritis    B12 deficiency    CARPAL TUNNEL SYNDROME, BILATERAL 08/04/2010   GERD 03/06/2008   HYPERTENSION 02/10/2007   Obesity    OBESITY NOS 02/10/2007   Sinusitis    Swelling    Vitamin D deficiency     Past Surgical History:  Procedure Laterality Date   CARPAL TUNNEL RELEASE     Both hands   COLONOSCOPY     2012 at [facility]   HEMORRHOID SURGERY     NASAL SINUS SURGERY     TOTAL KNEE ARTHROPLASTY Left 10/16/2019   Procedure: LEFT TOTAL KNEE ARTHROPLASTY;  Surgeon: Tarry Kos, MD;  Location: [facility] OR;  Service: Orthopedics;  Laterality: Left;    Family History  Problem Relation Age of Onset   Liver disease Mother    Hypertension Father    Colon cancer Neg Hx    Esophageal cancer Neg Hx    Rectal cancer Neg Hx    Stomach cancer Neg Hx     Social History   Socioeconomic History   Marital status: Married    Spouse name: Not on file   Number of children: Not on file   Years of education: Not on file   Highest education level: Not on file  Occupational History   Not on file  Tobacco Use   Smoking status: Never   Smokeless tobacco: Never  Vaping Use   Vaping Use: Never used  Substance and Sexual  Activity   Alcohol use: Yes    Comment: rarely   Drug use: No   Sexual activity: Not on file  Other Topics Concern   Not on file  Social History Narrative   Not on file   Social Determinants of Health   Financial Resource Strain: Not on file  Food Insecurity: Not on file  Transportation Needs: Not on file  Physical Activity: Not on file  Stress: Not on file  Social Connections: Not on file  Intimate Partner Violence: Not on file    Outpatient Medications Prior to Visit  Medication Sig Dispense Refill   benazepril (LOTENSIN) 20 MG tablet TAKE 1 TABLET BY MOUTH EVERY DAY 90 tablet 2   calcium carbonate (OSCAL) 1500 (600 Ca) MG TABS tablet Take by mouth 2 (two) times daily with a meal.     cetirizine-pseudoephedrine (ZYRTEC-D) 5-120 MG tablet Take 1 tablet by mouth daily as needed for allergies.     cholecalciferol (VITAMIN D3) 25 MCG (1000 UNIT) tablet Take 1,000 Units by mouth daily.     FERROUS SULFATE PO Take 1 tablet by mouth daily.  ibuprofen (ADVIL) 200 MG tablet Take 400 mg by mouth every 6 (six) hours as needed.     Multiple Vitamin (MULTIVITAMIN WITH MINERALS) TABS tablet Take 1 tablet by mouth daily.     methocarbamol (ROBAXIN) 500 MG tablet TAKE 1 TABLET BY MOUTH EVERY 8 HOURS AS NEEDED FOR MUSCLE SPASMS. 30 tablet 1   meloxicam (MOBIC) 7.5 MG tablet TAKE 1 TABLET BY MOUTH EVERY DAY (Patient not taking: Reported on 11/24/2021) 30 tablet 0   No facility-administered medications prior to visit.    Allergies  Allergen Reactions   Sulfamethoxazole Hives    ROS Review of Systems  Constitutional: Negative.  Negative for diaphoresis.  HENT: Negative.    Respiratory: Negative.  Negative for chest tightness and wheezing.   Cardiovascular:  Positive for chest pain. Negative for palpitations and leg swelling.  Gastrointestinal:  Negative for nausea and vomiting.  Skin: Negative.   Neurological:  Negative for speech difficulty, weakness and numbness.   Psychiatric/Behavioral: Negative.       Objective:    Physical Exam Vitals and nursing note reviewed.  Constitutional:      General: She is not in acute distress.    Appearance: Normal appearance. She is not ill-appearing, toxic-appearing or diaphoretic.  HENT:     Head: Normocephalic and atraumatic.     Right Ear: External ear normal.     Left Ear: External ear normal.  Eyes:     General: No scleral icterus.       Right eye: No discharge.        Left eye: No discharge.     Extraocular Movements: Extraocular movements intact.     Conjunctiva/sclera: Conjunctivae normal.  Pulmonary:     Effort: Pulmonary effort is normal.  Skin:    General: Skin is warm and dry.  Neurological:     Mental Status: She is alert and oriented to person, place, and time.  Psychiatric:        Mood and Affect: Mood normal.        Behavior: Behavior normal.    BP 122/68 (BP Location: Right Arm, Patient Position: Sitting, Cuff Size: Large)    Pulse 76    Temp (!) 97.2 F (36.2 C) (Temporal)    Ht 5\' 4"  (1.626 m)    Wt 235 lb (106.6 kg)    LMP 03/14/2014 (Approximate) Comment: perimenopause   SpO2 97%    BMI 40.34 kg/m  Wt Readings from Last 3 Encounters:  11/24/21 235 lb (106.6 kg)  10/23/21 236 lb (107 kg)  05/23/21 220 lb (99.8 kg)     Health Maintenance Due  Topic Date Due   HIV Screening  Never done   Hepatitis C Screening  Never done    There are no preventive care reminders to display for this patient.  Lab Results  Component Value Date   TSH 1.290 05/30/2020   Lab Results  Component Value Date   WBC 4.5 05/30/2020   HGB 13.0 05/30/2020   HCT 40.6 05/30/2020   MCV 93 05/30/2020   PLT 335 05/30/2020   Lab Results  Component Value Date   NA 141 02/14/2021   K 4.5 02/14/2021   CO2 29 02/14/2021   GLUCOSE 78 02/14/2021   BUN 22 02/14/2021   CREATININE 0.60 02/14/2021   BILITOT 0.4 05/30/2020   ALKPHOS 103 05/30/2020   AST 15 05/30/2020   ALT 13 05/30/2020   PROT 6.9  05/30/2020   ALBUMIN 4.5 05/30/2020   CALCIUM 9.2 02/14/2021  ANIONGAP 10 10/12/2019   GFR 97.82 02/14/2021   Lab Results  Component Value Date   CHOL 178 05/30/2020   Lab Results  Component Value Date   HDL 67 05/30/2020   Lab Results  Component Value Date   LDLCALC 103 (H) 05/30/2020   Lab Results  Component Value Date   TRIG 39 05/30/2020   Lab Results  Component Value Date   CHOLHDL 2 02/24/2019   Lab Results  Component Value Date   HGBA1C 5.3 05/30/2020      Assessment & Plan:   Problem List Items Addressed This Visit       Nervous and Auditory   Thoracic radiculopathy - Primary   Relevant Medications   meloxicam (MOBIC) 15 MG tablet   methocarbamol (ROBAXIN-750) 750 MG tablet   Other Relevant Orders   MR Thoracic Spine Wo Contrast   Ambulatory referral to Sports Medicine     Musculoskeletal and Integument   Osteopenia   Relevant Orders   DG Bone Density    Meds ordered this encounter  Medications   meloxicam (MOBIC) 15 MG tablet    Sig: Take 1 tablet (15 mg total) by mouth daily.    Dispense:  30 tablet    Refill:  1   methocarbamol (ROBAXIN-750) 750 MG tablet    Sig: Take 1 tablet (750 mg total) by mouth every 6 (six) hours as needed for muscle spasms.    Dispense:  50 tablet    Refill:  0    Follow-up: No follow-ups on file.    Mliss Sax, MD

## 2021-11-30 ENCOUNTER — Encounter: Payer: Self-pay | Admitting: Family Medicine

## 2021-12-04 NOTE — Progress Notes (Signed)
? ? Benito Mccreedy D.Merril Abbe ?[facility] ?[place] ?Phone: [phone] ?  ?Assessment and Plan:   ?  ?1. Chronic bilateral thoracic back pain ?2. Somatic dysfunction of thoracic region ?3. Somatic dysfunction of lumbar region ?4. Somatic dysfunction of rib region ?-Subacute, no improvement, initial sports medicine visit ?- Patient would likely experiencing a posteriorly displaced rib dysfunction at level T5 which was causing discomfort along fifth rib on right side.  This discomfort subsided after OMT ?- May continue medications prescribed by PCP for an additional 1 to 2 weeks which include meloxicam and Robaxin as directed ?- HEP provided for thoracic region ?- Patient elected to trial first OMT today.  Tolerated well per note below.  Patient had near resolution of symptoms after articulation of fifth rib on right ?- Decision today to treat with OMT was based on Physical Exam ?-With the improvement in today's visit, no mechanism of injury, no red flag symptoms with HPI or physical exam, I do not believe that patient needs to proceed with thoracic MRI at this time ? ?After verbal consent patient was treated with HVLA (high velocity low amplitude), ME (muscle energy), FPR (flex positional release), ST (soft tissue), PC/PD (Pelvic Compression/ Pelvic Decompression) techniques in , rib, thoracic, lumbar, areas. Patient tolerated the procedure well with improvement in symptoms.  Patient educated on potential side effects of soreness and recommended to rest, hydrate, and use Tylenol as needed for pain control.  ?  ?Pertinent previous records reviewed include PCP note 10/23/2021, PCP note 11/24/2021, PCP message 11/30/2021, thoracic x-ray 10/23/2021 ?  ?Follow Up: 2 weeks for reevaluation.  Could repeat OMT at that time.  Would discuss discontinuing medications if improving ?  ?Subjective:   ?I, Pincus Badder, am serving as a Education administrator for [employer] ? ?Chief  Complaint: thoracic radiculopathy  ? ?HPI:  ?12/05/2021 ?Patient is a 61 year old female complaining of thoracic radiculopathy. Patient states issue of mid back pain on the right side that radiates around her chest just underneath her breast happened in January .  no numbness or tingling She feels muscle spasms.  Lower doses of meloxicam and methocarbamol does seem to be working, was moving boxes when she was moving .  She is left hand dominant.  She does do some over shoulder lifting through her work.  Review of the plain films arthritis decreased disc space.  Radiologist noted osteopenia ? ?Relevant Historical Information: None pertinent ? ?Additional pertinent review of systems negative. ? ? ?Current Outpatient Medications:  ?  benazepril (LOTENSIN) 20 MG tablet, TAKE 1 TABLET BY MOUTH EVERY DAY, Disp: 90 tablet, Rfl: 2 ?  calcium carbonate (OSCAL) 1500 (600 Ca) MG TABS tablet, Take by mouth 2 (two) times daily with a meal., Disp: , Rfl:  ?  cetirizine-pseudoephedrine (ZYRTEC-D) 5-120 MG tablet, Take 1 tablet by mouth daily as needed for allergies., Disp: , Rfl:  ?  cholecalciferol (VITAMIN D3) 25 MCG (1000 UNIT) tablet, Take 1,000 Units by mouth daily., Disp: , Rfl:  ?  FERROUS SULFATE PO, Take 1 tablet by mouth daily. , Disp: , Rfl:  ?  ibuprofen (ADVIL) 200 MG tablet, Take 400 mg by mouth every 6 (six) hours as needed., Disp: , Rfl:  ?  meloxicam (MOBIC) 15 MG tablet, Take 1 tablet (15 mg total) by mouth daily., Disp: 30 tablet, Rfl: 1 ?  methocarbamol (ROBAXIN-750) 750 MG tablet, Take 1 tablet (750 mg total) by mouth every 6 (six)  hours as needed for muscle spasms., Disp: 50 tablet, Rfl: 0 ?  Multiple Vitamin (MULTIVITAMIN WITH MINERALS) TABS tablet, Take 1 tablet by mouth daily., Disp: , Rfl:   ? ?Objective:   ?  ?Vitals:  ? 12/05/21 1018  ?BP: 118/72  ?Pulse: 70  ?SpO2: 97%  ?Weight: 236 lb (107 kg)  ?Height: 5\' 4"  (1.626 m)  ?  ?  ?Body mass index is 40.51 kg/m?.  ?  ?Physical Exam:   ? ?General:  Well-appearing, cooperative, sitting comfortably in no acute distress.  ? ?OMT Physical Exam: ? ?  ?Rib: Posteriorly displaced fifth rib on right ?Thoracic: TTP paraspinal, T4-6 RRSL ?Lumbar: Mildly TTP paraspinal ?  ? ? ?Electronically signed by:  ?Benito Mccreedy D.Merril Abbe ?[facility] ?10:57 AM 12/05/21 ?

## 2021-12-05 ENCOUNTER — Ambulatory Visit (INDEPENDENT_AMBULATORY_CARE_PROVIDER_SITE_OTHER): Payer: 59 | Admitting: Sports Medicine

## 2021-12-05 ENCOUNTER — Other Ambulatory Visit: Payer: Self-pay

## 2021-12-05 VITALS — BP 118/72 | HR 70 | Ht 64.0 in | Wt 236.0 lb

## 2021-12-05 DIAGNOSIS — M9902 Segmental and somatic dysfunction of thoracic region: Secondary | ICD-10-CM | POA: Diagnosis not present

## 2021-12-05 DIAGNOSIS — M9908 Segmental and somatic dysfunction of rib cage: Secondary | ICD-10-CM | POA: Diagnosis not present

## 2021-12-05 DIAGNOSIS — M546 Pain in thoracic spine: Secondary | ICD-10-CM

## 2021-12-05 DIAGNOSIS — M9903 Segmental and somatic dysfunction of lumbar region: Secondary | ICD-10-CM | POA: Diagnosis not present

## 2021-12-05 DIAGNOSIS — G8929 Other chronic pain: Secondary | ICD-10-CM | POA: Diagnosis not present

## 2021-12-05 NOTE — Patient Instructions (Addendum)
Good to see you  ?Continue meds from your PCP ?Thoracic HEP  ?2 week follow up for repeat OMT ? ?

## 2021-12-07 ENCOUNTER — Other Ambulatory Visit: Payer: Self-pay | Admitting: Family Medicine

## 2021-12-07 DIAGNOSIS — I10 Essential (primary) hypertension: Secondary | ICD-10-CM

## 2021-12-16 ENCOUNTER — Other Ambulatory Visit: Payer: Self-pay | Admitting: Family Medicine

## 2021-12-16 DIAGNOSIS — M5414 Radiculopathy, thoracic region: Secondary | ICD-10-CM

## 2021-12-20 ENCOUNTER — Other Ambulatory Visit: Payer: Self-pay | Admitting: Family Medicine

## 2021-12-20 DIAGNOSIS — M5414 Radiculopathy, thoracic region: Secondary | ICD-10-CM

## 2021-12-22 ENCOUNTER — Ambulatory Visit: Payer: 59 | Admitting: Sports Medicine

## 2021-12-25 ENCOUNTER — Telehealth (HOSPITAL_BASED_OUTPATIENT_CLINIC_OR_DEPARTMENT_OTHER): Payer: Self-pay

## 2022-01-17 ENCOUNTER — Other Ambulatory Visit: Payer: Self-pay | Admitting: Family Medicine

## 2022-01-17 DIAGNOSIS — M5414 Radiculopathy, thoracic region: Secondary | ICD-10-CM

## 2022-01-22 ENCOUNTER — Other Ambulatory Visit: Payer: Self-pay

## 2022-01-22 MED FILL — Benazepril HCl Tab 20 MG: ORAL | 90 days supply | Qty: 90 | Fill #0 | Status: AC

## 2022-02-25 ENCOUNTER — Other Ambulatory Visit: Payer: Self-pay

## 2022-02-25 ENCOUNTER — Encounter: Payer: Self-pay | Admitting: Family Medicine

## 2022-02-25 ENCOUNTER — Ambulatory Visit (INDEPENDENT_AMBULATORY_CARE_PROVIDER_SITE_OTHER): Payer: 59 | Admitting: Family Medicine

## 2022-02-25 VITALS — BP 116/72 | HR 75 | Temp 97.0°F | Ht 64.0 in | Wt 237.0 lb

## 2022-02-25 DIAGNOSIS — Z6838 Body mass index (BMI) 38.0-38.9, adult: Secondary | ICD-10-CM

## 2022-02-25 DIAGNOSIS — Z0000 Encounter for general adult medical examination without abnormal findings: Secondary | ICD-10-CM

## 2022-02-25 DIAGNOSIS — M5414 Radiculopathy, thoracic region: Secondary | ICD-10-CM

## 2022-02-25 LAB — CBC WITH DIFFERENTIAL/PLATELET
Basophils Absolute: 0.1 10*3/uL (ref 0.0–0.1)
Basophils Relative: 1.1 % (ref 0.0–3.0)
Eosinophils Absolute: 0.1 10*3/uL (ref 0.0–0.7)
Eosinophils Relative: 2.9 % (ref 0.0–5.0)
HCT: 38.4 % (ref 36.0–46.0)
Hemoglobin: 12.7 g/dL (ref 12.0–15.0)
Lymphocytes Relative: 25.2 % (ref 12.0–46.0)
Lymphs Abs: 1.2 10*3/uL (ref 0.7–4.0)
MCHC: 33.1 g/dL (ref 30.0–36.0)
MCV: 92.9 fl (ref 78.0–100.0)
Monocytes Absolute: 0.5 10*3/uL (ref 0.1–1.0)
Monocytes Relative: 10 % (ref 3.0–12.0)
Neutro Abs: 2.8 10*3/uL (ref 1.4–7.7)
Neutrophils Relative %: 60.8 % (ref 43.0–77.0)
Platelets: 320 10*3/uL (ref 150.0–400.0)
RBC: 4.14 Mil/uL (ref 3.87–5.11)
RDW: 14.4 % (ref 11.5–15.5)
WBC: 4.7 10*3/uL (ref 4.0–10.5)

## 2022-02-25 LAB — URINALYSIS
Bilirubin Urine: NEGATIVE
Hgb urine dipstick: NEGATIVE
Ketones, ur: NEGATIVE
Leukocytes,Ua: NEGATIVE
Nitrite: NEGATIVE
Specific Gravity, Urine: 1.015 (ref 1.000–1.030)
Total Protein, Urine: NEGATIVE
Urine Glucose: NEGATIVE
Urobilinogen, UA: 0.2 (ref 0.0–1.0)
pH: 6 (ref 5.0–8.0)

## 2022-02-25 LAB — COMPREHENSIVE METABOLIC PANEL
ALT: 20 U/L (ref 0–35)
AST: 22 U/L (ref 0–37)
Albumin: 4.2 g/dL (ref 3.5–5.2)
Alkaline Phosphatase: 88 U/L (ref 39–117)
BUN: 23 mg/dL (ref 6–23)
CO2: 29 mEq/L (ref 19–32)
Calcium: 9.5 mg/dL (ref 8.4–10.5)
Chloride: 104 mEq/L (ref 96–112)
Creatinine, Ser: 0.64 mg/dL (ref 0.40–1.20)
GFR: 95.62 mL/min (ref >60.00)
Glucose, Bld: 86 mg/dL (ref 70–99)
Potassium: 4.2 mEq/L (ref 3.5–5.1)
Sodium: 139 mEq/L (ref 135–145)
Total Bilirubin: 0.5 mg/dL (ref 0.2–1.2)
Total Protein: 7.1 g/dL (ref 6.0–8.3)

## 2022-02-25 LAB — LIPID PANEL
Cholesterol: 165 mg/dL (ref 0–200)
HDL: 66.4 mg/dL (ref >39.00)
LDL Cholesterol: 89 mg/dL (ref 0–99)
NonHDL: 98.63
Total CHOL/HDL Ratio: 2
Triglycerides: 48 mg/dL (ref 0.0–149.0)
VLDL: 9.6 mg/dL (ref 0.0–40.0)

## 2022-02-25 MED ORDER — METHOCARBAMOL 750 MG PO TABS: 750.0000 mg | ORAL_TABLET | Freq: Four times a day (QID) | ORAL | 0 refills | Status: DC | PRN

## 2022-02-25 MED ORDER — PHENTERMINE HCL 30 MG PO CAPS: 30.0000 mg | ORAL_CAPSULE | ORAL | 0 refills | Status: DC

## 2022-02-25 NOTE — Progress Notes (Signed)
Established Patient Office Visit  Subjective   Patient ID: Hailey [name], female    DOB: [birth year]  Age: 61 y.o. MRN: [id number]  Chief Complaint  Patient presents with   Annual Exam    CPE, discuss starting on Phentermine to help with weight loss. Per patient she is very active with exercise but does not seem to help.     HPI yearly physical.  Health maintenance is up-to-date.  Regular Pap smears mammograms through GYN.  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  Works out in the garden.  Continues to have difficulty losing weight.  He is not interested in bariatric surgery.  Regular dental care twice yearly.  Yeah I cannot change it either I I guess I do need to okay on yeah I got I tried to anyone let me do it    Review of Systems  Constitutional:  Negative for chills, diaphoresis, malaise/fatigue and weight loss.  HENT: Negative.    Eyes: Negative.  Negative for blurred vision and double vision.  Cardiovascular:  Negative for chest pain.  Gastrointestinal:  Negative for abdominal pain.  Genitourinary: Negative.   Musculoskeletal:  Negative for falls and myalgias.  Neurological:  Negative for speech change, loss of consciousness and weakness.  Psychiatric/Behavioral: Negative.       02/25/2022    9:33 AM 02/25/2022    9:14 AM 11/24/2021   11:07 AM  Depression screen PHQ 2/9  Decreased Interest 0 0 0  Down, Depressed, Hopeless 0 0 0  PHQ - 2 Score 0 0 0  Altered sleeping 0    Tired, decreased energy 0    Change in appetite 0    Feeling bad or failure about yourself  0    Trouble concentrating 0    Moving slowly or fidgety/restless 0    Suicidal thoughts 0    PHQ-9 Score 0    Difficult doing work/chores Not difficult at all         Objective:     BP 116/72 (BP Location: Right Arm, Patient Position: Sitting, Cuff Size: Large)   Pulse 75   Temp (!) 97 F (36.1 C) (Temporal)   Ht 5\' 4"  (1.626 m)   Wt 237  lb (107.5 kg)   LMP 03/14/2014 (Approximate) Comment: perimenopause  SpO2 95%   BMI 40.68 kg/m    Physical Exam Constitutional:      General: She is not in acute distress.    Appearance: Normal appearance. She is not ill-appearing, toxic-appearing or diaphoretic.  HENT:     Head: Normocephalic and atraumatic.     Right Ear: External ear normal.     Left Ear: External ear normal.     Mouth/Throat:     Mouth: Mucous membranes are moist.     Pharynx: Oropharynx is clear. No oropharyngeal exudate or posterior oropharyngeal erythema.  Eyes:     General: No scleral icterus.       Right eye: No discharge.        Left eye: No discharge.     Extraocular Movements: Extraocular movements intact.     Conjunctiva/sclera: Conjunctivae normal.     Pupils: Pupils are equal, round, and reactive to light.  Cardiovascular:     Rate and Rhythm: Normal rate and regular rhythm.  Pulmonary:     Effort: Pulmonary effort is normal. No respiratory distress.     Breath sounds: Normal breath sounds.  Abdominal:  General: Bowel sounds are normal.     Tenderness: There is no abdominal tenderness. There is no guarding.  Musculoskeletal:     Cervical back: No rigidity or tenderness.  Skin:    General: Skin is warm and dry.  Neurological:     Mental Status: She is alert and oriented to person, place, and time.  Psychiatric:        Mood and Affect: Mood normal.        Behavior: Behavior normal.     No results found for any visits on 02/25/22.    The 10-year ASCVD risk score (Arnett DK, et al., 2019) is: 4.5%    Assessment & Plan:   Problem List Items Addressed This Visit       Nervous and Auditory   Thoracic radiculopathy   Relevant Medications   phentermine 30 MG capsule     Other   Class 2 severe obesity due to excess calories with serious comorbidity and body mass index (BMI) of 38.0 to 38.9 in adult [facility])   Relevant Medications   phentermine 30 MG capsule   Other Visit  Diagnoses     Healthcare maintenance    -  Primary   Relevant Orders   CBC with Differential/Platelet   Comprehensive metabolic panel   Lipid panel   Urinalysis       Return in about 3 months (around 05/28/2022).  Information was given on bariatric surgery.  May consider weight loss management.  Given information on health maintenance and disease prevention.  90-day supply of phentermine given.  Follow-up in 3 months to document weight loss.  Mliss Sax, MD

## 2022-02-26 ENCOUNTER — Other Ambulatory Visit: Payer: Self-pay

## 2022-02-26 MED ORDER — PHENTERMINE HCL 30 MG PO CAPS
ORAL_CAPSULE | ORAL | 0 refills | Status: DC
  Filled 2022-02-26: qty 90, 90d supply, fill #0

## 2022-03-02 ENCOUNTER — Other Ambulatory Visit: Payer: Self-pay

## 2022-03-04 ENCOUNTER — Other Ambulatory Visit: Payer: Self-pay

## 2022-03-04 IMAGING — DX DG THORACIC SPINE 3V
4 series · 4 of 4 positions shown · non-contrast
Comparison: PA and lateral chest 06/11/2008.

CLINICAL DATA: Back pain after moving furniture 2 weeks ago.

EXAM:
THORACIC SPINE - 3 VIEWS

[thoracic spine ap]
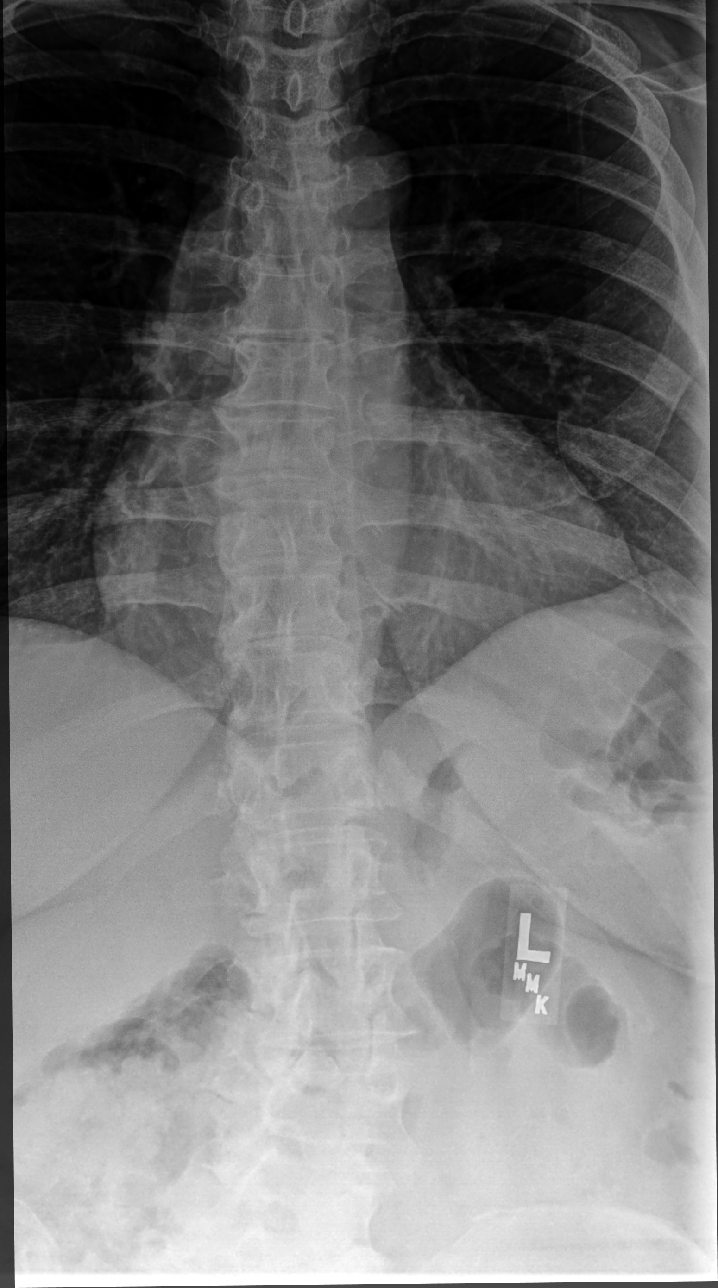

[thoracic spine lat (1 of 2)]
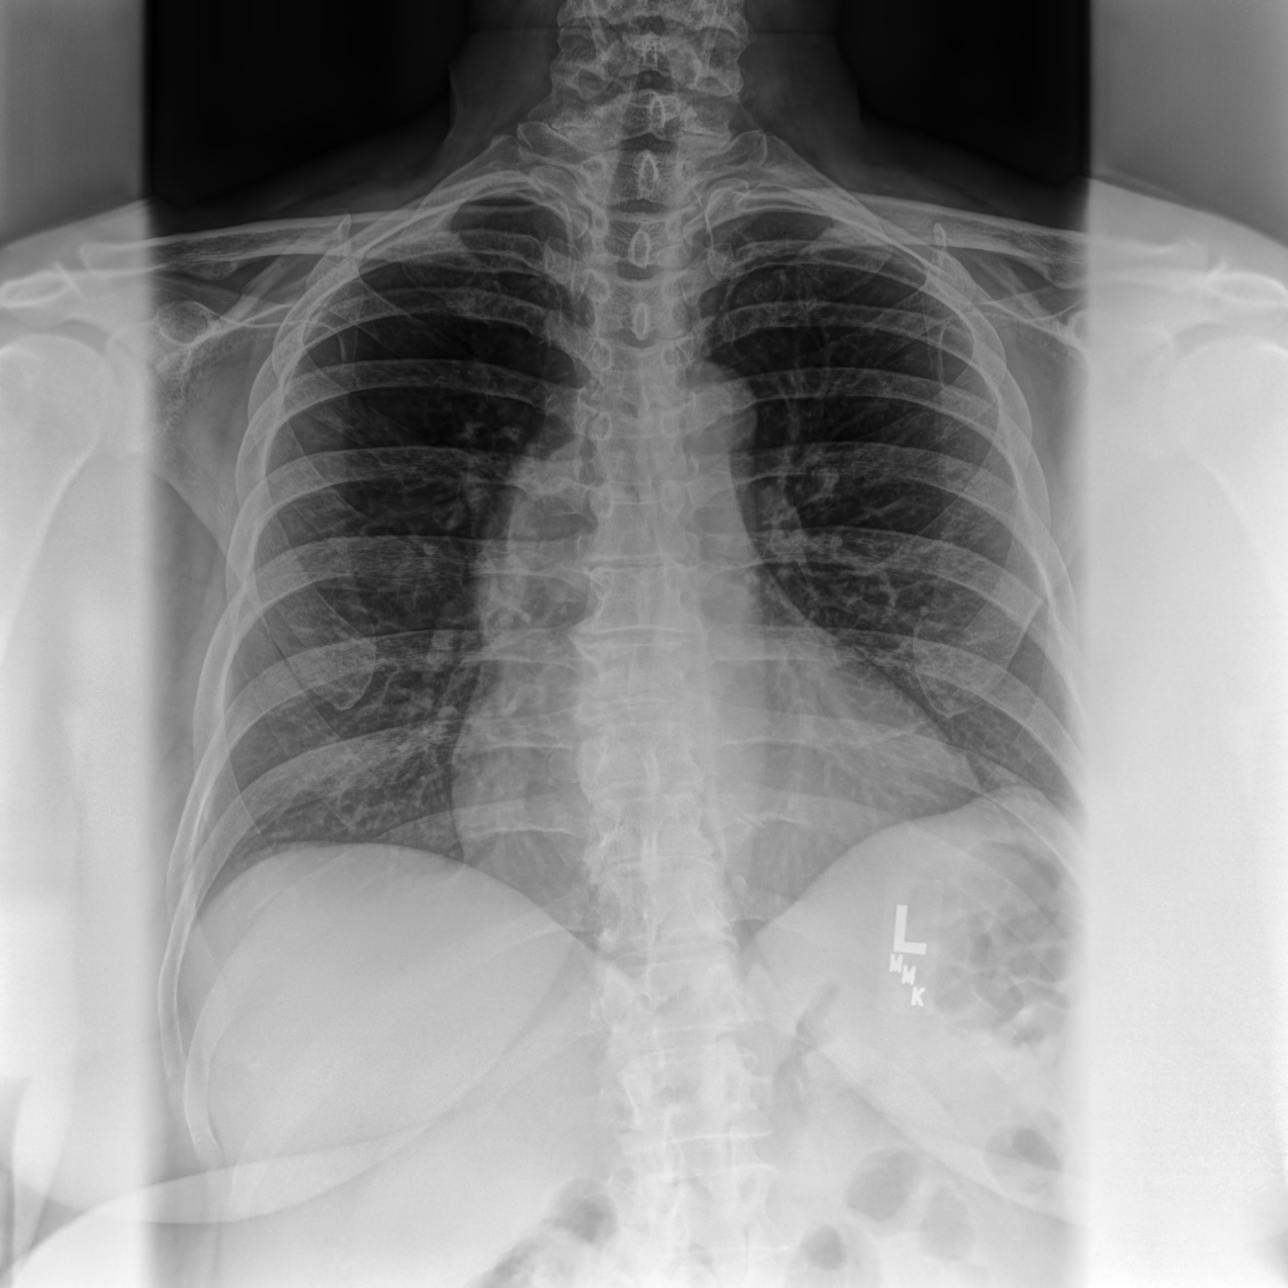

[cervico-thoracic (swimmers) lat]
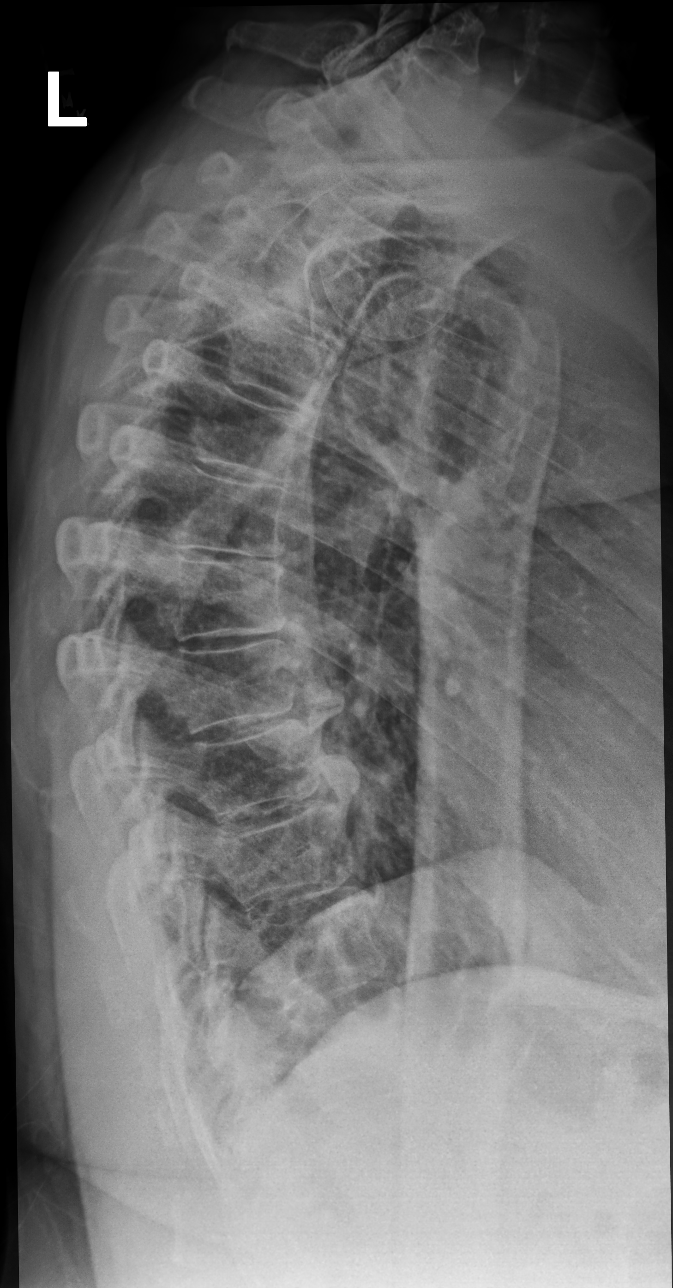

[thoracic spine lat (2 of 2)]
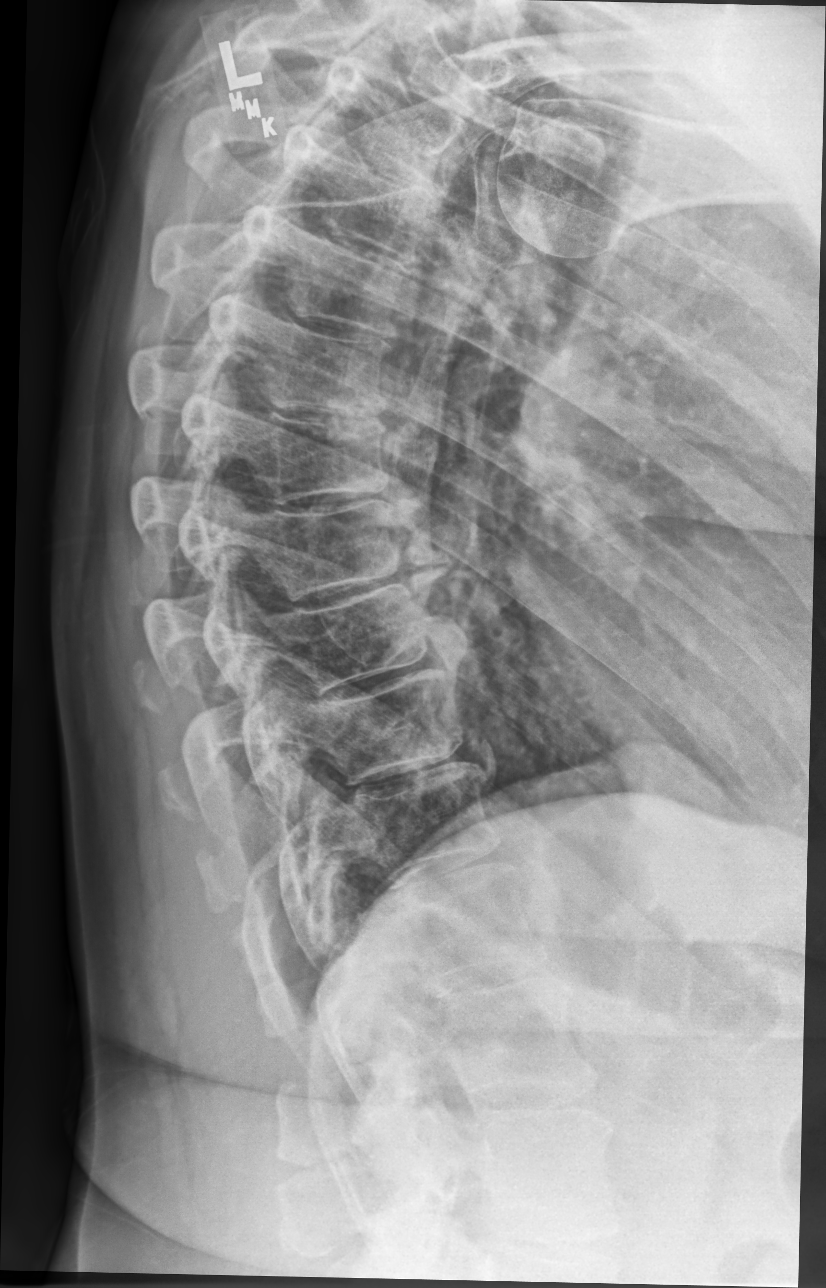

[4 of 4 positions shown; findings below may reference images not displayed]

FINDINGS: The bone mineralization slightly osteopenic. There is minimal
thoracic kyphodextroscoliosis which was seen previously. No fracture
is evident. The vertebra are normal in heights.

No AP listhesis is seen. There is mild degenerative disc change and
spondylosis in the upper thoracic spine and, since 0995 interval
increased anterior bridging enthesopathy of DISH in the lower
thoracic spine with preserved lower thoracic disc heights.

Arthritic changes are not seen.  The cardiac size is normal.
IMPRESSION: Slight osteopenia. Minimal kyphodextroscoliosis with degenerative
and DISH changes. No evidence of fractures.

## 2022-05-06 ENCOUNTER — Encounter (INDEPENDENT_AMBULATORY_CARE_PROVIDER_SITE_OTHER): Payer: Self-pay

## 2022-05-06 ENCOUNTER — Other Ambulatory Visit: Payer: Self-pay | Admitting: Family Medicine

## 2022-05-06 MED FILL — Benazepril HCl Tab 20 MG: ORAL | 90 days supply | Qty: 90 | Fill #1 | Status: AC

## 2022-05-07 ENCOUNTER — Other Ambulatory Visit: Payer: Self-pay

## 2022-05-07 ENCOUNTER — Other Ambulatory Visit: Payer: Self-pay | Admitting: Family Medicine

## 2022-05-11 ENCOUNTER — Other Ambulatory Visit: Payer: Self-pay | Admitting: Family Medicine

## 2022-05-12 ENCOUNTER — Other Ambulatory Visit: Payer: Self-pay | Admitting: Family Medicine

## 2022-05-12 ENCOUNTER — Other Ambulatory Visit: Payer: Self-pay

## 2022-05-14 ENCOUNTER — Ambulatory Visit: Payer: Commercial Managed Care - PPO | Admitting: Physician Assistant

## 2022-05-14 ENCOUNTER — Other Ambulatory Visit: Payer: Self-pay

## 2022-05-25 ENCOUNTER — Encounter: Payer: Self-pay | Admitting: Family Medicine

## 2022-05-25 ENCOUNTER — Ambulatory Visit: Payer: 59 | Admitting: Family Medicine

## 2022-05-25 VITALS — BP 114/72 | HR 74 | Temp 97.0°F | Ht 64.0 in | Wt 221.8 lb

## 2022-05-25 DIAGNOSIS — Z6838 Body mass index (BMI) 38.0-38.9, adult: Secondary | ICD-10-CM | POA: Diagnosis not present

## 2022-05-25 DIAGNOSIS — E6609 Other obesity due to excess calories: Secondary | ICD-10-CM

## 2022-05-25 MED ORDER — PHENTERMINE HCL 30 MG PO CAPS: 30.0000 mg | ORAL_CAPSULE | ORAL | 0 refills | Status: DC

## 2022-05-25 NOTE — Progress Notes (Signed)
Established Patient Office Visit  Subjective   Patient ID: Hailey [name], female    DOB: May 24, 1961  Age: 61 y.o. MRN: [id number]  Chief Complaint  Patient presents with   Follow-up    3 month follow up, no concerns.     HPI for follow-up of obesity treated with phentermine.  Has been able to lose 15 pounds.  She is tolerating the drug well.  Denies palpitation shortness of breath or chest pain.  Blood pressure remains in the normal range.  She continues to exercise.  Definitely helped with tight control.  She is not interested in bariatric surgery.  She understands that [employer] meaning is not a long-term solution.  She believes that the GLP-1 agonists are not covered by insurance.    Review of Systems  Constitutional:  Negative for chills, diaphoresis, malaise/fatigue and weight loss.  HENT: Negative.    Eyes: Negative.  Negative for blurred vision and double vision.  Respiratory:  Negative for shortness of breath.   Cardiovascular:  Negative for palpitations.  Gastrointestinal:  Negative for abdominal pain.  Genitourinary: Negative.   Musculoskeletal:  Negative for falls and myalgias.  Neurological:  Negative for speech change, loss of consciousness and weakness.  Psychiatric/Behavioral: Negative.        Objective:     BP 114/72 (BP Location: Right Arm, Patient Position: Sitting, Cuff Size: Large)   Pulse 74   Temp (!) 97 F (36.1 C) (Temporal)   Ht 5\' 4"  (1.626 m)   Wt 221 lb 12.8 oz (100.6 kg)   LMP 03/14/2014 (Approximate) Comment: perimenopause  SpO2 96%   BMI 38.07 kg/m  BP Readings from Last 3 Encounters:  05/25/22 114/72  02/25/22 116/72  12/05/21 118/72   Wt Readings from Last 3 Encounters:  05/25/22 221 lb 12.8 oz (100.6 kg)  02/25/22 237 lb (107.5 kg)  12/05/21 236 lb (107 kg)      Physical Exam Constitutional:      General: She is not in acute distress.    Appearance: Normal appearance. She is obese. She is not ill-appearing, toxic-appearing  or diaphoretic.  HENT:     Head: Normocephalic and atraumatic.     Right Ear: External ear normal.     Left Ear: External ear normal.  Eyes:     General: No scleral icterus.       Right eye: No discharge.        Left eye: No discharge.     Extraocular Movements: Extraocular movements intact.     Conjunctiva/sclera: Conjunctivae normal.  Pulmonary:     Effort: Pulmonary effort is normal. No respiratory distress.  Skin:    General: Skin is warm and dry.  Neurological:     Mental Status: She is alert and oriented to person, place, and time.  Psychiatric:        Mood and Affect: Mood normal.        Behavior: Behavior normal.      No results found for any visits on 05/25/22.    The 10-year ASCVD risk score (Arnett DK, et al., 2019) is: 4%    Assessment & Plan:   Problem List Items Addressed This Visit       Other   Class 2 obesity due to excess calories without serious comorbidity with body mass index (BMI) of 38.0 to 38.9 in adult - Primary   Relevant Medications   phentermine 30 MG capsule    Return in about 3 months (around 08/25/2022).  Discussed  that GLP-1 agonists are covered for weight loss such as WNIOEV.  Continue phentermine.  Continue exercise as tolerated.  Understands that weight loss is more about calories consumed than exercise.  Exercise remains very important.  Mliss Sax, MD

## 2022-05-26 ENCOUNTER — Other Ambulatory Visit: Payer: Self-pay

## 2022-05-26 MED ORDER — PHENTERMINE HCL 30 MG PO CAPS
30.0000 mg | ORAL_CAPSULE | Freq: Every morning | ORAL | 0 refills | Status: DC
  Filled 2022-05-26: qty 90, 90d supply, fill #0

## 2022-07-28 ENCOUNTER — Other Ambulatory Visit: Payer: Self-pay

## 2022-07-28 MED FILL — Benazepril HCl Tab 20 MG: ORAL | 60 days supply | Qty: 60 | Fill #2 | Status: AC

## 2022-08-10 ENCOUNTER — Ambulatory Visit: Payer: 59 | Admitting: Sports Medicine

## 2022-08-10 VITALS — BP 136/80 | HR 71 | Ht 64.0 in | Wt 229.0 lb

## 2022-08-10 DIAGNOSIS — M9903 Segmental and somatic dysfunction of lumbar region: Secondary | ICD-10-CM | POA: Diagnosis not present

## 2022-08-10 DIAGNOSIS — M546 Pain in thoracic spine: Secondary | ICD-10-CM | POA: Diagnosis not present

## 2022-08-10 DIAGNOSIS — G8929 Other chronic pain: Secondary | ICD-10-CM | POA: Diagnosis not present

## 2022-08-10 DIAGNOSIS — M9908 Segmental and somatic dysfunction of rib cage: Secondary | ICD-10-CM | POA: Diagnosis not present

## 2022-08-10 DIAGNOSIS — M9902 Segmental and somatic dysfunction of thoracic region: Secondary | ICD-10-CM

## 2022-08-10 MED ORDER — MELOXICAM 15 MG PO TABS: 15.0000 mg | ORAL_TABLET | Freq: Every day | ORAL | 0 refills | Status: DC

## 2022-08-10 NOTE — Patient Instructions (Addendum)
Good to see you  - Start meloxicam 15 mg daily x2 weeks.  If still having pain after 2 weeks, complete 3rd-week of meloxicam. May use remaining meloxicam as needed once daily for pain control.  Do not to use additional NSAIDs while taking meloxicam.  May use Tylenol 500-1000 mg 2 to 3 times a day for breakthrough pain. 4 week follow up  

## 2022-08-10 NOTE — Progress Notes (Signed)
Hailey [name] D.[facility] [address] [place] [address] Phone: [phone]   Assessment and Plan:     1. Chronic bilateral thoracic back pain 2. Somatic dysfunction of thoracic region 3. Somatic dysfunction of lumbar region 4. Somatic dysfunction of rib region  -Chronic with exacerbation, subsequent visit - Patient has had a near identical flare of right-sided thoracic pain radiating around right side similar to what she experienced at office visit on 12/05/2021 that was completely resolved with OMT and articulation of right fifth rib - OMT was repeated today and a similar articulation was felt at right fifth rib with thoracic HVLA to decrease patient's overall pain - Start meloxicam 15 mg daily x2 weeks.  If still having pain after 2 weeks, complete 3rd-week of meloxicam. May use remaining meloxicam as needed once daily for pain control.  Do not to use additional NSAIDs while taking meloxicam.  May use Tylenol [phone] mg 2 to 3 times a day for breakthrough pain. - Patient has received significant relief with OMT in the past.  Elects for repeat OMT today.  Tolerated well per note below. - Decision today to treat with OMT was based on Physical Exam  After verbal consent patient was treated with HVLA (high velocity low amplitude), ME (muscle energy), FPR (flex positional release), ST (soft tissue), PC/PD (Pelvic Compression/ Pelvic Decompression) techniques in  , rib, thoracic, lumbar  areas. Patient tolerated the procedure well with improvement in symptoms.  Patient educated on potential side effects of soreness and recommended to rest, hydrate, and use Tylenol as needed for pain control.    Pertinent previous records reviewed include none   Follow Up: 3 to 4 weeks for reevaluation.  Could consider repeat OMT.  Would discontinue meloxicam at that time.  Ultimately patient may benefit from regular OMT visits which could potentially be spread out  every 3 months with visits in between for breakthrough   Subjective:   I, Hailey [name], am serving as a Neurosurgeon for Doctor [employer]  Chief Complaint: back pain   HPI:  I, Hailey [name], am serving as a Neurosurgeon for Doctor Richardean Sale   Chief Complaint: thoracic radiculopathy    HPI:   12/05/2021 Patient is a 61 year old female complaining of thoracic radiculopathy. Patient states issue of mid back pain on the right side that radiates around her chest just underneath her breast happened in January .  no numbness or tingling She feels muscle spasms.  Lower doses of meloxicam and methocarbamol does seem to be working, was moving boxes when she was moving .  She is left hand dominant.  She does do some over shoulder lifting through her work.  Review of the plain films arthritis decreased disc space.  Radiologist noted osteopenia    08/10/22 Patient states that she is having the same pain that she had when she came in the first time , she is in flare and wants , muscle and relaxer and tylenol have helped   Relevant Historical Information:-Tension, GERD  Additional pertinent review of systems negative.   Current Outpatient Medications:    benazepril (LOTENSIN) 20 MG tablet, TAKE 1 TABLET BY MOUTH EVERY DAY, Disp: 90 tablet, Rfl: 2   calcium carbonate (OSCAL) 1500 (600 Ca) MG TABS tablet, Take by mouth 2 (two) times daily with a meal., Disp: , Rfl:    cetirizine-pseudoephedrine (ZYRTEC-D) 5-120 MG tablet, Take 1 tablet by mouth daily as needed for allergies., Disp: , Rfl:  cholecalciferol (VITAMIN D3) 25 MCG (1000 UNIT) tablet, Take 1,000 Units by mouth daily., Disp: , Rfl:    FERROUS SULFATE PO, Take 1 tablet by mouth daily. , Disp: , Rfl:    ibuprofen (ADVIL) 200 MG tablet, Take 400 mg by mouth every 6 (six) hours as needed., Disp: , Rfl:    Multiple Vitamin (MULTIVITAMIN WITH MINERALS) TABS tablet, Take 1 tablet by mouth daily., Disp: , Rfl:    phentermine 30 MG capsule,  Take 1 capsule (30 mg total) by mouth in the morning., Disp: 90 capsule, Rfl: 0   phentermine 30 MG capsule, Take 1 capsule (30 mg total) by mouth every morning., Disp: 90 capsule, Rfl: 0   Objective:     Vitals:   08/10/22 1410  BP: 136/80  Pulse: 71  SpO2: 99%  Weight: 229 lb (103.9 kg)  Height: 5\' 4"  (1.626 m)      Body mass index is 39.31 kg/m.    Physical Exam:    .General: Well-appearing, cooperative, sitting comfortably in no acute distress.   OMT Physical Exam:     Rib: Posteriorly displaced fifth rib on right Thoracic: TTP paraspinal, T4-6 RRSL Lumbar: Mildly TTP paraspinal    Electronically signed by:  D[name] 2:42 PM 08/10/22

## 2022-08-26 ENCOUNTER — Other Ambulatory Visit: Payer: Self-pay

## 2022-08-26 MED ORDER — COVID-19 MRNA VAC-TRIS(PFIZER) 30 MCG/0.3ML IM SUSY
PREFILLED_SYRINGE | INTRAMUSCULAR | 0 refills | Status: DC
  Filled 2022-08-28: qty 0.3, 1d supply, fill #0

## 2022-08-28 ENCOUNTER — Other Ambulatory Visit: Payer: Self-pay

## 2022-09-17 NOTE — Progress Notes (Deleted)
Hailey [name] D.[facility] [address] [place] [address] Phone: [phone]   Assessment and Plan:     There are no diagnoses linked to this encounter.  ***   Pertinent previous records reviewed include ***   Follow Up: ***     Subjective:   I, Hailey [name], am serving as a Neurosurgeon for Doctor [employer]   Chief Complaint: back pain    HPI:  I, Hailey [name], am serving as a Neurosurgeon for Doctor Richardean Sale   Chief Complaint: thoracic radiculopathy    HPI:    12/05/2021 Patient is a 61 year old female complaining of thoracic radiculopathy. Patient states issue of mid back pain on the right side that radiates around her chest just underneath her breast happened in January .  no numbness or tingling She feels muscle spasms.  Lower doses of meloxicam and methocarbamol does seem to be working, was moving boxes when she was moving .  She is left hand dominant.  She does do some over shoulder lifting through her work.  Review of the plain films arthritis decreased disc space.  Radiologist noted osteopenia   08/10/22 Patient states that she is having the same pain that she had when she came in the first time , she is in flare and wants , muscle and relaxer and tylenol have helped   09/18/2022 Patient states    Relevant Historical Information:-Tension, GERD  Additional pertinent review of systems negative.   Current Outpatient Medications:    benazepril (LOTENSIN) 20 MG tablet, TAKE 1 TABLET BY MOUTH EVERY DAY, Disp: 90 tablet, Rfl: 2   calcium carbonate (OSCAL) 1500 (600 Ca) MG TABS tablet, Take by mouth 2 (two) times daily with a meal., Disp: , Rfl:    cetirizine-pseudoephedrine (ZYRTEC-D) 5-120 MG tablet, Take 1 tablet by mouth daily as needed for allergies., Disp: , Rfl:    cholecalciferol (VITAMIN D3) 25 MCG (1000 UNIT) tablet, Take 1,000 Units by mouth daily., Disp: , Rfl:    COVID-19 mRNA vaccine 2023-2024  (COMIRNATY) syringe, Inject into the muscle., Disp: 0.3 mL, Rfl: 0   FERROUS SULFATE PO, Take 1 tablet by mouth daily. , Disp: , Rfl:    ibuprofen (ADVIL) 200 MG tablet, Take 400 mg by mouth every 6 (six) hours as needed., Disp: , Rfl:    meloxicam (MOBIC) 15 MG tablet, Take 1 tablet (15 mg total) by mouth daily., Disp: 30 tablet, Rfl: 0   Multiple Vitamin (MULTIVITAMIN WITH MINERALS) TABS tablet, Take 1 tablet by mouth daily., Disp: , Rfl:    phentermine 30 MG capsule, Take 1 capsule (30 mg total) by mouth every morning., Disp: 90 capsule, Rfl: 0   phentermine 30 MG capsule, Take 1 capsule (30 mg total) by mouth in the morning., Disp: 90 capsule, Rfl: 0   Objective:     There were no vitals filed for this visit.    There is no height or weight on file to calculate BMI.    Physical Exam:    ***   Electronically signed by:  Hailey [name] [facility] 12:20 PM 09/17/22

## 2022-09-18 ENCOUNTER — Ambulatory Visit: Payer: 59 | Admitting: Sports Medicine

## 2022-09-22 ENCOUNTER — Other Ambulatory Visit: Payer: Self-pay | Admitting: Family Medicine

## 2022-09-22 ENCOUNTER — Other Ambulatory Visit: Payer: Self-pay

## 2022-09-22 DIAGNOSIS — I10 Essential (primary) hypertension: Secondary | ICD-10-CM

## 2022-09-22 MED FILL — Benazepril HCl Tab 20 MG: ORAL | 90 days supply | Qty: 90 | Fill #0 | Status: AC

## 2022-09-23 ENCOUNTER — Other Ambulatory Visit: Payer: Self-pay

## 2022-10-01 NOTE — Progress Notes (Signed)
Hailey [name] D.[place] Phone: [phone]   Assessment and Plan:     1. Chronic bilateral thoracic back pain 2. Somatic dysfunction of thoracic region 3. Somatic dysfunction of lumbar region 4. Somatic dysfunction of rib region  -Chronic with exacerbation, subsequent visit - Patient had improvement with meloxicam course and OMT after previous office visit on 08/10/2022, however she had to miss her last appointment due to her husband being sick and feels that musculoskeletal pains with most prominent being in thoracic region have largely returned and are additionally causing some left shoulder discomfort - Patient elected for repeat OMT today.  Tolerated well per note below - Discontinue daily meloxicam and may use remainder as needed - Patient has received significant relief with OMT in the past.  Elects for repeat OMT today.  Tolerated well per note below. - Decision today to treat with OMT was based on Physical Exam  After verbal consent patient was treated with HVLA (high velocity low amplitude), ME (muscle energy), FPR (flex positional release), ST (soft tissue), techniques in , rib, thoracic, lumbar areas. Patient tolerated the procedure well with improvement in symptoms.  Patient educated on potential side effects of soreness and recommended to rest, hydrate, and use Tylenol as needed for pain control.  Pertinent previous records reviewed include none   Follow Up: 3 to 4 weeks for reevaluation.  Could consider repeat OMT.  If no improvement in shoulder pain, could perform dedicated shoulder exam and consider shoulder x-ray   Subjective:   I, Hailey [name], am serving as a Education administrator for Doctor [employer]   Chief Complaint: back pain    HPI:  I, Hailey [name], am serving as a Education administrator for Doctor Glennon Mac   Chief Complaint: thoracic radiculopathy    HPI:    12/05/2021 Patient is a 62  year old female complaining of thoracic radiculopathy. Patient states issue of mid back pain on the right side that radiates around her chest just underneath her breast happened in January .  no numbness or tingling She feels muscle spasms.  Lower doses of meloxicam and methocarbamol does seem to be working, was moving boxes when she was moving .  She is left hand dominant.  She does do some over shoulder lifting through her work.  Review of the plain films arthritis decreased disc space.  Radiologist noted osteopenia   08/10/22 Patient states that she is having the same pain that she had when she came in the first time , she is in flare and wants , muscle and relaxer and tylenol have helped    10/02/2022 Patient states she doing pretty good still has the snag in her back , she can tell a difference when she take meloxicam still feel the knot in her back , feels like a crook in her neck to her shoulder    Relevant Historical Information:-Tension, GERD  Additional pertinent review of systems negative.   Current Outpatient Medications:    benazepril (LOTENSIN) 20 MG tablet, TAKE 1 TABLET BY MOUTH EVERY DAY, Disp: 90 tablet, Rfl: 0   calcium carbonate (OSCAL) 1500 (600 Ca) MG TABS tablet, Take by mouth 2 (two) times daily with a meal., Disp: , Rfl:    cetirizine-pseudoephedrine (ZYRTEC-D) 5-120 MG tablet, Take 1 tablet by mouth daily as needed for allergies., Disp: , Rfl:    cholecalciferol (VITAMIN D3) 25 MCG (1000 UNIT) tablet, Take 1,000 Units by mouth daily., Disp: ,  Rfl:    COVID-19 mRNA vaccine 2023-2024 (COMIRNATY) syringe, Inject into the muscle., Disp: 0.3 mL, Rfl: 0   FERROUS SULFATE PO, Take 1 tablet by mouth daily. , Disp: , Rfl:    ibuprofen (ADVIL) 200 MG tablet, Take 400 mg by mouth every 6 (six) hours as needed., Disp: , Rfl:    meloxicam (MOBIC) 15 MG tablet, Take 1 tablet (15 mg total) by mouth daily., Disp: 30 tablet, Rfl: 0   Multiple Vitamin (MULTIVITAMIN WITH MINERALS) TABS  tablet, Take 1 tablet by mouth daily., Disp: , Rfl:    phentermine 30 MG capsule, Take 1 capsule (30 mg total) by mouth in the morning., Disp: 90 capsule, Rfl: 0   phentermine 30 MG capsule, Take 1 capsule (30 mg total) by mouth every morning., Disp: 90 capsule, Rfl: 0   Objective:     Vitals:   10/02/22 1301  BP: 120/84  Pulse: 84  SpO2: 97%  Weight: 230 lb (104.3 kg)  Height: 5\' 4"  (1.626 m)      Body mass index is 39.48 kg/m.    Physical Exam:    General: Well-appearing, cooperative, sitting comfortably in no acute distress.    OMT Physical Exam:   Rib: Posteriorly displaced fifth rib on right Thoracic: TTP paraspinal, T4-6 RRSL Lumbar: Mildly TTP paraspinal    Electronically signed by:  Hailey [name] D.[name] 1:33 PM 10/02/22

## 2022-10-02 ENCOUNTER — Ambulatory Visit (INDEPENDENT_AMBULATORY_CARE_PROVIDER_SITE_OTHER): Payer: Commercial Managed Care - PPO | Admitting: Sports Medicine

## 2022-10-02 VITALS — BP 120/84 | HR 84 | Ht 64.0 in | Wt 230.0 lb

## 2022-10-02 DIAGNOSIS — G8929 Other chronic pain: Secondary | ICD-10-CM

## 2022-10-02 DIAGNOSIS — M9902 Segmental and somatic dysfunction of thoracic region: Secondary | ICD-10-CM

## 2022-10-02 DIAGNOSIS — M9908 Segmental and somatic dysfunction of rib cage: Secondary | ICD-10-CM

## 2022-10-02 DIAGNOSIS — M9903 Segmental and somatic dysfunction of lumbar region: Secondary | ICD-10-CM | POA: Diagnosis not present

## 2022-10-02 DIAGNOSIS — M546 Pain in thoracic spine: Secondary | ICD-10-CM | POA: Diagnosis not present

## 2022-10-02 NOTE — Patient Instructions (Signed)
Discontinue daily meloxicam and use remainder as needed Continue HEP  4 week follow up

## 2022-11-03 ENCOUNTER — Other Ambulatory Visit: Payer: Self-pay

## 2022-11-03 MED ORDER — AMOXICILLIN 500 MG PO CAPS
2000.0000 mg | ORAL_CAPSULE | ORAL | 1 refills | Status: DC
  Filled 2022-11-03: qty 20, 5d supply, fill #0

## 2022-12-01 DIAGNOSIS — H02216 Cicatricial lagophthalmos left eye, unspecified eyelid: Secondary | ICD-10-CM | POA: Diagnosis not present

## 2022-12-01 DIAGNOSIS — L905 Scar conditions and fibrosis of skin: Secondary | ICD-10-CM | POA: Diagnosis not present

## 2022-12-15 ENCOUNTER — Ambulatory Visit: Payer: Commercial Managed Care - PPO | Admitting: Sports Medicine

## 2022-12-15 ENCOUNTER — Other Ambulatory Visit (HOSPITAL_COMMUNITY): Payer: Self-pay

## 2022-12-15 VITALS — HR 74 | Ht 64.0 in | Wt 240.0 lb

## 2022-12-15 DIAGNOSIS — G8929 Other chronic pain: Secondary | ICD-10-CM

## 2022-12-15 DIAGNOSIS — M546 Pain in thoracic spine: Secondary | ICD-10-CM | POA: Diagnosis not present

## 2022-12-15 DIAGNOSIS — M25512 Pain in left shoulder: Secondary | ICD-10-CM | POA: Diagnosis not present

## 2022-12-15 MED ORDER — METHOCARBAMOL 500 MG PO TABS
500.0000 mg | ORAL_TABLET | Freq: Three times a day (TID) | ORAL | 0 refills | Status: DC | PRN
  Filled 2022-12-15: qty 60, 20d supply, fill #0

## 2022-12-15 MED ORDER — MELOXICAM 15 MG PO TABS
15.0000 mg | ORAL_TABLET | Freq: Every day | ORAL | 0 refills | Status: DC
Start: 2022-12-15 — End: 2023-06-14
  Filled 2022-12-15: qty 30, 30d supply, fill #0

## 2022-12-15 NOTE — Patient Instructions (Addendum)
Good to see you - Start meloxicam 15 mg daily x2 weeks.  If still having pain after 2 weeks, complete 3rd-week of meloxicam. May use remaining meloxicam as needed once daily for pain control.  Do not to use additional NSAIDs while taking meloxicam.  May use Tylenol [phone] mg 2 to 3 times a day for breakthrough pain. Robaxin 500 mg three times a day as needed Thoracic and shoulder HEP 3 week follow up

## 2022-12-15 NOTE — Progress Notes (Signed)
Hailey [name] D.[place] Phone: [phone]   Assessment and Plan:     1. Chronic bilateral thoracic back pain 2. Chronic left shoulder pain  -Chronic with exacerbation, subsequent visit - Recurrent multiple musculoskeletal complaints with most prominent being left shoulder, left thoracic - Recommend starting HEP for thoracic and left shoulder - Start meloxicam 15 mg daily x2 weeks.  If still having pain after 2 weeks, complete 3rd-week of meloxicam. May use remaining meloxicam as needed once daily for pain control.  Do not to use additional NSAIDs while taking meloxicam.  May use Tylenol [phone] mg 2 to 3 times a day for breakthrough pain. - Start Robaxin 500 mg 3 times a day as needed for muscle spasms  Pertinent previous records reviewed include none   Follow Up: 3 weeks for reevaluation.  Could obtain left shoulder x-ray if no improvement or worsening of symptoms.  Could consider subacromial CSI versus OMT versus physical therapy based on presentation   Subjective:   I, Hailey [name], am serving as a Education administrator for Doctor [employer]   Chief Complaint: back pain    HPI:  I, Hailey [name], am serving as a Education administrator for Doctor [employer]   Chief Complaint: thoracic radiculopathy    HPI:    12/05/2021 Patient is a 62 year old female complaining of thoracic radiculopathy. Patient states issue of mid back pain on the right side that radiates around her chest just underneath her breast happened in January .  no numbness or tingling She feels muscle spasms.  Lower doses of meloxicam and methocarbamol does seem to be working, was moving boxes when she was moving .  She is left hand dominant.  She does do some over shoulder lifting through her work.  Review of the plain films arthritis decreased disc space.  Radiologist noted osteopenia   08/10/22 Patient states that she is having the same pain that  she had when she came in the first time , she is in flare and wants , muscle and relaxer and tylenol have helped    10/02/2022 Patient states she doing pretty good still has the snag in her back , she can tell a difference when she take meloxicam still feel the knot in her back , feels like a crook in her neck to her shoulder    12/15/2022 Patient states back and shoulder are in flare , has been using old muscle relaxer's and those have helped but has run out   Relevant Historical Information:-Tension, GERD  Additional pertinent review of systems negative.   Current Outpatient Medications:    meloxicam (MOBIC) 15 MG tablet, Take 1 tablet (15 mg total) by mouth daily., Disp: 30 tablet, Rfl: 0   methocarbamol (ROBAXIN) 500 MG tablet, Take 1 tablet (500 mg total) by mouth 3 (three) times daily as needed for muscle spasms., Disp: 60 tablet, Rfl: 0   amoxicillin (AMOXIL) 500 MG capsule, Take 4 capsules (2,000 mg total) by mouth 1 hour prior to dental appointment., Disp: 20 capsule, Rfl: 1   benazepril (LOTENSIN) 20 MG tablet, TAKE 1 TABLET BY MOUTH EVERY DAY, Disp: 90 tablet, Rfl: 0   calcium carbonate (OSCAL) 1500 (600 Ca) MG TABS tablet, Take by mouth 2 (two) times daily with a meal., Disp: , Rfl:    cetirizine-pseudoephedrine (ZYRTEC-D) 5-120 MG tablet, Take 1 tablet by mouth daily as needed for allergies., Disp: , Rfl:    cholecalciferol (VITAMIN  D3) 25 MCG (1000 UNIT) tablet, Take 1,000 Units by mouth daily., Disp: , Rfl:    COVID-19 mRNA vaccine 2023-2024 (COMIRNATY) syringe, Inject into the muscle., Disp: 0.3 mL, Rfl: 0   FERROUS SULFATE PO, Take 1 tablet by mouth daily. , Disp: , Rfl:    ibuprofen (ADVIL) 200 MG tablet, Take 400 mg by mouth every 6 (six) hours as needed., Disp: , Rfl:    meloxicam (MOBIC) 15 MG tablet, Take 1 tablet (15 mg total) by mouth daily., Disp: 30 tablet, Rfl: 0   Multiple Vitamin (MULTIVITAMIN WITH MINERALS) TABS tablet, Take 1 tablet by mouth daily., Disp: , Rfl:     phentermine 30 MG capsule, Take 1 capsule (30 mg total) by mouth every morning., Disp: 90 capsule, Rfl: 0   phentermine 30 MG capsule, Take 1 capsule (30 mg total) by mouth in the morning., Disp: 90 capsule, Rfl: 0   Objective:     Vitals:   12/15/22 1508  Pulse: 74  SpO2: 96%  Weight: 240 lb (108.9 kg)  Height: 5\' 4"  (1.626 m)      Body mass index is 41.2 kg/m.    Physical Exam:    Gen: Appears well, nad, nontoxic and pleasant Neuro:sensation intact, strength is 5/5 with df/pf/inv/ev, muscle tone wnl Skin: no suspicious lesion or defmority Psych: A&O, appropriate mood and affect  Left shoulder:  No deformity, swelling or muscle wasting No scapular winging FF 180 pain with arc, abd 180 pain with arc, int 0 pain with arc, ext 90 NTTP over the , clavicle, ac, coracoid, biceps groove, humerus, deltoid, trapezius, cervical spine TTP left thoracic paraspinal and left rib cage Negative Spurling's test bilat FROM of neck    Electronically signed by:  Hailey [name] D.[facility] 4:07 PM 12/15/22

## 2022-12-16 ENCOUNTER — Ambulatory Visit: Payer: Commercial Managed Care - PPO | Admitting: Sports Medicine

## 2022-12-28 ENCOUNTER — Other Ambulatory Visit: Payer: Self-pay

## 2022-12-28 ENCOUNTER — Other Ambulatory Visit: Payer: Self-pay | Admitting: Family Medicine

## 2022-12-28 DIAGNOSIS — I10 Essential (primary) hypertension: Secondary | ICD-10-CM

## 2022-12-28 MED ORDER — BENAZEPRIL HCL 20 MG PO TABS
20.0000 mg | ORAL_TABLET | Freq: Every day | ORAL | 0 refills | Status: DC
Start: 2022-12-28 — End: 2023-03-22
  Filled 2022-12-28: qty 90, 90d supply, fill #0

## 2023-02-05 ENCOUNTER — Ambulatory Visit: Payer: Commercial Managed Care - PPO | Admitting: Family Medicine

## 2023-02-05 ENCOUNTER — Encounter: Payer: Self-pay | Admitting: Family Medicine

## 2023-02-05 ENCOUNTER — Other Ambulatory Visit (HOSPITAL_COMMUNITY): Payer: Self-pay

## 2023-02-05 VITALS — BP 125/68 | HR 78 | Temp 98.0°F | Ht 64.0 in | Wt 241.4 lb

## 2023-02-05 DIAGNOSIS — J069 Acute upper respiratory infection, unspecified: Secondary | ICD-10-CM | POA: Diagnosis not present

## 2023-02-05 DIAGNOSIS — Z01818 Encounter for other preprocedural examination: Secondary | ICD-10-CM | POA: Diagnosis not present

## 2023-02-05 DIAGNOSIS — H02216 Cicatricial lagophthalmos left eye, unspecified eyelid: Secondary | ICD-10-CM | POA: Diagnosis not present

## 2023-02-05 MED ORDER — BENZONATATE 200 MG PO CAPS
200.0000 mg | ORAL_CAPSULE | Freq: Two times a day (BID) | ORAL | 0 refills | Status: DC | PRN
Start: 2023-02-05 — End: 2023-02-23
  Filled 2023-02-05: qty 20, 10d supply, fill #0

## 2023-02-05 MED ORDER — PSEUDOEPHEDRINE-GUAIFENESIN ER 60-600 MG PO TB12
1.0000 | ORAL_TABLET | Freq: Two times a day (BID) | ORAL | 1 refills | Status: DC
Start: 2023-02-05 — End: 2023-02-23
  Filled 2023-02-05: qty 10, 5d supply, fill #0

## 2023-02-05 NOTE — Progress Notes (Signed)
Get knees congestion  Established Patient Office Visit   Subjective:  Patient ID: Hailey [name], female    DOB: [birth year]  Age: 62 y.o. MRN: [id number]  Chief Complaint  Patient presents with   Sinusitis    Sore throat, nasal chest and head congestion. Negative covid yesterday. Symptoms x 4 days.     Sinusitis Associated symptoms include congestion, coughing and a sore throat. Pertinent negatives include no chills or shortness of breath.   4-day history of bitemporal headache, nasal congestion with postnasal drip, sore throat, cough.  Denies wheezing or difficulty breathing.  No asthma or tobacco use history.  No fevers chills.  There is some pressure at the bridge of her nose. Encounter Diagnoses  Name Primary?   Viral upper respiratory tract infection Yes      Review of Systems  Constitutional: Negative.  Negative for chills and fever.  HENT:  Positive for congestion and sore throat.   Eyes:  Negative for blurred vision, discharge and redness.  Respiratory:  Positive for cough. Negative for shortness of breath and wheezing.   Cardiovascular: Negative.   Gastrointestinal:  Negative for abdominal pain.  Genitourinary: Negative.   Musculoskeletal: Negative.  Negative for myalgias.  Skin:  Negative for rash.  Neurological:  Negative for tingling, loss of consciousness and weakness.  Endo/Heme/Allergies:  Negative for polydipsia.     Current Outpatient Medications:    benazepril (LOTENSIN) 20 MG tablet, Take 1 tablet (20 mg total) by mouth daily., Disp: 90 tablet, Rfl: 0   benzonatate (TESSALON) 200 MG capsule, Take 1 capsule (200 mg total) by mouth 2 (two) times daily as needed for cough., Disp: 20 capsule, Rfl: 0   calcium carbonate (OSCAL) 1500 (600 Ca) MG TABS tablet, Take by mouth 2 (two) times daily with a meal., Disp: , Rfl:    cetirizine-pseudoephedrine (ZYRTEC-D) 5-120 MG tablet, Take 1 tablet by mouth daily as needed for allergies., Disp: , Rfl:    cholecalciferol  (VITAMIN D3) 25 MCG (1000 UNIT) tablet, Take 1,000 Units by mouth daily., Disp: , Rfl:    FERROUS SULFATE PO, Take 1 tablet by mouth daily. , Disp: , Rfl:    ibuprofen (ADVIL) 200 MG tablet, Take 400 mg by mouth every 6 (six) hours as needed., Disp: , Rfl:    meloxicam (MOBIC) 15 MG tablet, Take 1 tablet (15 mg total) by mouth daily., Disp: 30 tablet, Rfl: 0   methocarbamol (ROBAXIN) 500 MG tablet, Take 1 tablet (500 mg total) by mouth 3 (three) times daily as needed for muscle spasms., Disp: 60 tablet, Rfl: 0   Multiple Vitamin (MULTIVITAMIN WITH MINERALS) TABS tablet, Take 1 tablet by mouth daily., Disp: , Rfl:    pseudoephedrine-guaifenesin (MUCINEX D) 60-600 MG 12 hr tablet, Take 1 tablet by mouth every 12 (twelve) hours., Disp: 10 tablet, Rfl: 1   COVID-19 mRNA vaccine 2023-2024 (COMIRNATY) syringe, Inject into the muscle., Disp: 0.3 mL, Rfl: 0   Objective:     BP 125/68 (BP Location: Right Arm, Patient Position: Sitting, Cuff Size: Large)   Pulse 78   Temp 98 F (36.7 C) (Temporal)   Ht 5\' 4"  (1.626 m)   Wt 241 lb 6.4 oz (109.5 kg)   LMP 03/14/2014 (Approximate) Comment: perimenopause  SpO2 95%   BMI 41.44 kg/m    Physical Exam Constitutional:      General: She is not in acute distress.    Appearance: Normal appearance. She is not ill-appearing, toxic-appearing or diaphoretic.  HENT:  Head: Normocephalic and atraumatic.     Right Ear: Tympanic membrane, ear canal and external ear normal.     Left Ear: Tympanic membrane, ear canal and external ear normal.     Mouth/Throat:     Mouth: Mucous membranes are moist.     Pharynx: Oropharynx is clear. No oropharyngeal exudate or posterior oropharyngeal erythema.  Eyes:     General: No scleral icterus.       Right eye: No discharge.        Left eye: No discharge.     Extraocular Movements: Extraocular movements intact.     Conjunctiva/sclera: Conjunctivae normal.     Pupils: Pupils are equal, round, and reactive to light.   Cardiovascular:     Rate and Rhythm: Normal rate and regular rhythm.  Pulmonary:     Effort: Pulmonary effort is normal. No respiratory distress.     Breath sounds: Normal breath sounds. No wheezing or rales.  Musculoskeletal:     Cervical back: No rigidity or tenderness.  Skin:    General: Skin is warm and dry.  Neurological:     Mental Status: She is alert and oriented to person, place, and time.  Psychiatric:        Mood and Affect: Mood normal.        Behavior: Behavior normal.      No results found for any visits on 02/05/23.    The 10-year ASCVD risk score (Arnett DK, et al., 2019) is: 5.6%    Assessment & Plan:   Viral upper respiratory tract infection -     Pseudoephedrine-guaiFENesin ER; Take 1 tablet by mouth every 12 (twelve) hours.  Dispense: 10 tablet; Refill: 1 -     Benzonatate; Take 1 capsule (200 mg total) by mouth 2 (two) times daily as needed for cough.  Dispense: 20 capsule; Refill: 0    Return Call or return next week if not improving.    Mliss Sax, MD

## 2023-02-18 ENCOUNTER — Other Ambulatory Visit (HOSPITAL_COMMUNITY): Payer: Self-pay

## 2023-02-18 MED ORDER — ERYTHROMYCIN 5 MG/GM OP OINT
TOPICAL_OINTMENT | OPHTHALMIC | 3 refills | Status: DC
  Filled 2023-02-18: qty 7, 5d supply, fill #0
  Filled 2023-03-10: qty 7, 14d supply, fill #1
  Filled 2023-03-22: qty 3.5, 7d supply, fill #2

## 2023-02-23 ENCOUNTER — Ambulatory Visit: Payer: Commercial Managed Care - PPO | Admitting: Family Medicine

## 2023-02-23 ENCOUNTER — Telehealth: Payer: Self-pay | Admitting: Family Medicine

## 2023-02-23 ENCOUNTER — Encounter: Payer: Self-pay | Admitting: Family Medicine

## 2023-02-23 ENCOUNTER — Other Ambulatory Visit (HOSPITAL_COMMUNITY): Payer: Self-pay

## 2023-02-23 VITALS — BP 120/76 | HR 84 | Temp 97.8°F | Ht 64.0 in | Wt 241.8 lb

## 2023-02-23 DIAGNOSIS — R051 Acute cough: Secondary | ICD-10-CM | POA: Diagnosis not present

## 2023-02-23 DIAGNOSIS — R0982 Postnasal drip: Secondary | ICD-10-CM

## 2023-02-23 DIAGNOSIS — J019 Acute sinusitis, unspecified: Secondary | ICD-10-CM | POA: Diagnosis not present

## 2023-02-23 MED ORDER — PREDNISONE 10 MG PO TABS
10.0000 mg | ORAL_TABLET | Freq: Two times a day (BID) | ORAL | 0 refills | Status: AC
Start: 2023-02-23 — End: 2023-02-28
  Filled 2023-02-23: qty 10, 5d supply, fill #0

## 2023-02-23 MED ORDER — BENZONATATE 200 MG PO CAPS
200.0000 mg | ORAL_CAPSULE | Freq: Two times a day (BID) | ORAL | 0 refills | Status: DC | PRN
Start: 2023-02-23 — End: 2023-05-24
  Filled 2023-02-23: qty 20, 10d supply, fill #0

## 2023-02-23 MED ORDER — AMOXICILLIN-POT CLAVULANATE 875-125 MG PO TABS
1.0000 | ORAL_TABLET | Freq: Two times a day (BID) | ORAL | 0 refills | Status: DC
Start: 2023-02-23 — End: 2023-05-24
  Filled 2023-02-23: qty 20, 10d supply, fill #0

## 2023-02-23 MED ORDER — DM-GUAIFENESIN ER 30-600 MG PO TB12
1.0000 | ORAL_TABLET | Freq: Two times a day (BID) | ORAL | 0 refills | Status: DC
Start: 2023-02-23 — End: 2023-05-24
  Filled 2023-02-23: qty 20, 10d supply, fill #0

## 2023-02-23 NOTE — Progress Notes (Signed)
Established Patient Office Visit   Subjective:  Patient ID: Hailey [name], female    DOB: 1961-05-08  Age: 62 y.o. MRN: [id number]  Chief Complaint  Patient presents with   Sinus Problem    Still having sinus issues x 3 weeks no improvement.     Sinus Problem Associated symptoms include coughing. Pertinent negatives include no chills, ear pain or shortness of breath.   Encounter Diagnoses  Name Primary?   Acute sinusitis, recurrence not specified, unspecified location Yes   Postnasal drip    Acute cough    Did not clear from last visit.  ONgoing cough now productive of purulent phlegm.  There is been no fevers or chills.  Denies wheeziness, tightness in the chest or difficulty breathing.  There is facial pressure.  Continues with Flonase.   Review of Systems  Constitutional: Negative.  Negative for chills and fever.  HENT:  Positive for sinus pain. Negative for ear pain and hearing loss.   Eyes:  Negative for blurred vision, discharge and redness.  Respiratory:  Positive for cough and sputum production. Negative for shortness of breath and wheezing.   Cardiovascular: Negative.   Gastrointestinal:  Negative for abdominal pain.  Genitourinary: Negative.   Musculoskeletal: Negative.  Negative for myalgias.  Skin:  Negative for rash.  Neurological:  Negative for tingling, loss of consciousness and weakness.  Endo/Heme/Allergies:  Negative for polydipsia.     Current Outpatient Medications:    amoxicillin-clavulanate (AUGMENTIN) 875-125 MG tablet, Take 1 tablet by mouth 2 times daily., Disp: 20 tablet, Rfl: 0   benazepril (LOTENSIN) 20 MG tablet, Take 1 tablet (20 mg total) by mouth daily., Disp: 90 tablet, Rfl: 0   benzonatate (TESSALON) 200 MG capsule, Take 1 capsule (200 mg) by mouth 2 times daily as needed for cough., Disp: 20 capsule, Rfl: 0   calcium carbonate (OSCAL) 1500 (600 Ca) MG TABS tablet, Take by mouth 2 (two) times daily with a meal., Disp: , Rfl:     cholecalciferol (VITAMIN D3) 25 MCG (1000 UNIT) tablet, Take 1,000 Units by mouth daily., Disp: , Rfl:    dextromethorphan-guaiFENesin (MUCINEX DM) 30-600 MG 12hr tablet, Take 1 tablet by mouth 2 (two) times daily., Disp: 20 tablet, Rfl: 0   erythromycin ophthalmic ointment, Apply to incisions and lashes 3 times a day on the LEFT eye for 2 weeks, Disp: 5 g, Rfl: 3   FERROUS SULFATE PO, Take 1 tablet by mouth daily. , Disp: , Rfl:    ibuprofen (ADVIL) 200 MG tablet, Take 400 mg by mouth every 6 (six) hours as needed., Disp: , Rfl:    meloxicam (MOBIC) 15 MG tablet, Take 1 tablet (15 mg total) by mouth daily., Disp: 30 tablet, Rfl: 0   methocarbamol (ROBAXIN) 500 MG tablet, Take 1 tablet (500 mg total) by mouth 3 (three) times daily as needed for muscle spasms., Disp: 60 tablet, Rfl: 0   Multiple Vitamin (MULTIVITAMIN WITH MINERALS) TABS tablet, Take 1 tablet by mouth daily., Disp: , Rfl:    predniSONE (DELTASONE) 10 MG tablet, Take 1 tablet (10 mg) by mouth 2 times daily with a meal for 5 days., Disp: 10 tablet, Rfl: 0   cetirizine-pseudoephedrine (ZYRTEC-D) 5-120 MG tablet, Take 1 tablet by mouth daily as needed for allergies. (Patient not taking: Reported on 02/23/2023), Disp: , Rfl:    Objective:     BP 120/76 (BP Location: Right Arm, Patient Position: Sitting, Cuff Size: Large)   Pulse 84   Temp 97.8  F (36.6 C) (Temporal)   Ht 5\' 4"  (1.626 m)   Wt 241 lb 12.8 oz (109.7 kg)   LMP 03/14/2014 (Approximate) Comment: perimenopause  SpO2 95%   BMI 41.50 kg/m    Physical Exam Constitutional:      General: She is not in acute distress.    Appearance: Normal appearance. She is not ill-appearing, toxic-appearing or diaphoretic.  HENT:     Head: Normocephalic and atraumatic.     Right Ear: Tympanic membrane, ear canal and external ear normal.     Left Ear: Tympanic membrane, ear canal and external ear normal.     Mouth/Throat:     Mouth: Mucous membranes are moist.     Pharynx: Oropharynx  is clear. No oropharyngeal exudate or posterior oropharyngeal erythema.  Eyes:     General: No scleral icterus.       Right eye: No discharge.        Left eye: No discharge.     Extraocular Movements: Extraocular movements intact.     Conjunctiva/sclera: Conjunctivae normal.     Pupils: Pupils are equal, round, and reactive to light.  Cardiovascular:     Rate and Rhythm: Normal rate and regular rhythm.  Pulmonary:     Effort: Pulmonary effort is normal. No respiratory distress.     Breath sounds: Normal breath sounds. No wheezing or rales.  Abdominal:     General: Bowel sounds are normal.     Tenderness: There is no abdominal tenderness. There is no guarding.  Musculoskeletal:     Cervical back: No rigidity or tenderness.  Skin:    General: Skin is warm and dry.  Neurological:     Mental Status: She is alert and oriented to person, place, and time.  Psychiatric:        Mood and Affect: Mood normal.        Behavior: Behavior normal.      No results found for any visits on 02/23/23.    The 10-year ASCVD risk score (Arnett DK, et al., 2019) is: 5%    Assessment & Plan:   Acute sinusitis, recurrence not specified, unspecified location -     Amoxicillin-Pot Clavulanate; Take 1 tablet by mouth 2 times daily.  Dispense: 20 tablet; Refill: 0  Postnasal drip -     predniSONE; Take 1 tablet (10 mg) by mouth 2 times daily with a meal for 5 days.  Dispense: 10 tablet; Refill: 0  Acute cough -     Benzonatate; Take 1 capsule (200 mg) by mouth 2 times daily as needed for cough.  Dispense: 20 capsule; Refill: 0 -     predniSONE; Take 1 tablet (10 mg) by mouth 2 times daily with a meal for 5 days.  Dispense: 10 tablet; Refill: 0 -     DM-guaiFENesin ER; Take 1 tablet by mouth 2 (two) times daily.  Dispense: 20 tablet; Refill: 0    Return in about 1 week (around 03/02/2023), or if symptoms worsen or fail to improve.    Mliss Sax, MD

## 2023-02-23 NOTE — Telephone Encounter (Signed)
Error, she was seen

## 2023-02-23 NOTE — Telephone Encounter (Signed)
Pt was a no show for an OV with Dr Doreene Burke on  02/23/23, I sent a no show letter.

## 2023-02-24 DIAGNOSIS — H02214 Cicatricial lagophthalmos left upper eyelid: Secondary | ICD-10-CM | POA: Diagnosis not present

## 2023-03-10 ENCOUNTER — Other Ambulatory Visit (HOSPITAL_COMMUNITY): Payer: Self-pay

## 2023-03-22 ENCOUNTER — Other Ambulatory Visit: Payer: Self-pay

## 2023-03-22 ENCOUNTER — Other Ambulatory Visit: Payer: Self-pay | Admitting: Family Medicine

## 2023-03-22 DIAGNOSIS — I10 Essential (primary) hypertension: Secondary | ICD-10-CM

## 2023-03-22 MED ORDER — BENAZEPRIL HCL 20 MG PO TABS
20.0000 mg | ORAL_TABLET | Freq: Every day | ORAL | 0 refills | Status: AC
Start: 2023-03-22 — End: ?
  Filled 2023-03-22: qty 90, 90d supply, fill #0

## 2023-05-10 ENCOUNTER — Other Ambulatory Visit: Payer: Self-pay | Admitting: Sports Medicine

## 2023-05-24 ENCOUNTER — Ambulatory Visit (INDEPENDENT_AMBULATORY_CARE_PROVIDER_SITE_OTHER): Payer: Commercial Managed Care - PPO | Admitting: Family Medicine

## 2023-05-24 ENCOUNTER — Encounter: Payer: Self-pay | Admitting: Family Medicine

## 2023-05-24 VITALS — BP 110/70 | HR 72 | Temp 98.0°F | Ht 64.0 in | Wt 240.8 lb

## 2023-05-24 DIAGNOSIS — I10 Essential (primary) hypertension: Secondary | ICD-10-CM

## 2023-05-24 DIAGNOSIS — E611 Iron deficiency: Secondary | ICD-10-CM

## 2023-05-24 DIAGNOSIS — Z0000 Encounter for general adult medical examination without abnormal findings: Secondary | ICD-10-CM

## 2023-05-24 DIAGNOSIS — Z1231 Encounter for screening mammogram for malignant neoplasm of breast: Secondary | ICD-10-CM | POA: Diagnosis not present

## 2023-05-24 DIAGNOSIS — Z6841 Body Mass Index (BMI) 40.0 and over, adult: Secondary | ICD-10-CM

## 2023-05-24 DIAGNOSIS — Z13220 Encounter for screening for lipoid disorders: Secondary | ICD-10-CM | POA: Diagnosis not present

## 2023-05-24 LAB — URINALYSIS, ROUTINE W REFLEX MICROSCOPIC
Bilirubin Urine: NEGATIVE
Hgb urine dipstick: NEGATIVE
Ketones, ur: NEGATIVE
Leukocytes,Ua: NEGATIVE
Nitrite: NEGATIVE
Specific Gravity, Urine: 1.02 (ref 1.000–1.030)
Total Protein, Urine: NEGATIVE
Urine Glucose: NEGATIVE
Urobilinogen, UA: 0.2 (ref 0.0–1.0)
pH: 6 (ref 5.0–8.0)

## 2023-05-24 NOTE — Progress Notes (Signed)
Established Patient Office Visit   Subjective:  Patient ID: Hailey [name], female    DOB: [birth year]  Age: 62 y.o. MRN: [id number]  Chief Complaint  Patient presents with   Annual Exam    Pt here for Annual Exam and is currently fasting    Hypertension    Hypertension Pertinent negatives include no blurred vision.   Encounter Diagnoses  Name Primary?   Healthcare maintenance Yes   BMI 40.0-44.9, adult [facility])    Essential hypertension    Screening cholesterol level    Breast cancer screening by mammogram    Iron deficiency    Here for physical exam and follow-up of above.  Doing well.  Difficulty losing weight.  Having trouble resisting carbs.  He is active physically for at least 30 minutes daily.  Regular dental care.  Continues to work full-time.  Continues Lotensin for hypertension.   Review of Systems  Constitutional: Negative.   HENT: Negative.    Eyes:  Negative for blurred vision, discharge and redness.  Respiratory: Negative.    Cardiovascular: Negative.   Gastrointestinal:  Negative for abdominal pain.  Genitourinary: Negative.   Musculoskeletal: Negative.  Negative for myalgias.  Skin:  Negative for rash.  Neurological:  Negative for tingling, loss of consciousness and weakness.  Endo/Heme/Allergies:  Negative for polydipsia.     Current Outpatient Medications:    amoxicillin-clavulanate (AUGMENTIN) 875-125 MG tablet, Take 1 tablet by mouth 2 times daily., Disp: 20 tablet, Rfl: 0   benazepril (LOTENSIN) 20 MG tablet, Take 1 tablet (20 mg total) by mouth daily., Disp: 90 tablet, Rfl: 0   calcium carbonate (OSCAL) 1500 (600 Ca) MG TABS tablet, Take by mouth 2 (two) times daily with a meal., Disp: , Rfl:    cetirizine-pseudoephedrine (ZYRTEC-D) 5-120 MG tablet, Take 1 tablet by mouth daily as needed for allergies., Disp: , Rfl:    cholecalciferol (VITAMIN D3) 25 MCG (1000 UNIT) tablet, Take 1,000 Units by mouth daily., Disp: , Rfl:     dextromethorphan-guaiFENesin (MUCINEX DM) 30-600 MG 12hr tablet, Take 1 tablet by mouth 2 (two) times daily., Disp: 20 tablet, Rfl: 0   FERROUS SULFATE PO, Take 1 tablet by mouth daily. , Disp: , Rfl:    ibuprofen (ADVIL) 200 MG tablet, Take 400 mg by mouth every 6 (six) hours as needed., Disp: , Rfl:    meloxicam (MOBIC) 15 MG tablet, Take 1 tablet (15 mg total) by mouth daily., Disp: 30 tablet, Rfl: 0   Multiple Vitamin (MULTIVITAMIN WITH MINERALS) TABS tablet, Take 1 tablet by mouth daily., Disp: , Rfl:    benzonatate (TESSALON) 200 MG capsule, Take 1 capsule (200 mg) by mouth 2 times daily as needed for cough. (Patient not taking: Reported on 05/24/2023), Disp: 20 capsule, Rfl: 0   erythromycin ophthalmic ointment, Apply to incisions and lashes 3 times a day on the LEFT eye for 2 weeks (Patient not taking: Reported on 05/24/2023), Disp: 5 g, Rfl: 3   methocarbamol (ROBAXIN) 500 MG tablet, Take 1 tablet (500 mg total) by mouth 3 (three) times daily as needed for muscle spasms. (Patient not taking: Reported on 05/24/2023), Disp: 60 tablet, Rfl: 0   Objective:     BP 110/70   Pulse 72   Temp 98 F (36.7 C)   Ht 5\' 4"  (1.626 m)   Wt 240 lb 12.8 oz (109.2 kg)   LMP 03/14/2014 (Approximate) Comment: perimenopause  SpO2 95%   BMI 41.33 kg/m  Wt Readings from Last 3  Encounters:  05/24/23 240 lb 12.8 oz (109.2 kg)  02/23/23 241 lb 12.8 oz (109.7 kg)  02/05/23 241 lb 6.4 oz (109.5 kg)      Physical Exam Constitutional:      General: She is not in acute distress.    Appearance: Normal appearance. She is not ill-appearing, toxic-appearing or diaphoretic.  HENT:     Head: Normocephalic and atraumatic.     Right Ear: Tympanic membrane, ear canal and external ear normal.     Left Ear: Tympanic membrane, ear canal and external ear normal.     Mouth/Throat:     Mouth: Mucous membranes are moist.     Pharynx: Oropharynx is clear. No oropharyngeal exudate or posterior oropharyngeal erythema.   Eyes:     General: No scleral icterus.       Right eye: No discharge.        Left eye: No discharge.     Extraocular Movements: Extraocular movements intact.     Conjunctiva/sclera: Conjunctivae normal.     Pupils: Pupils are equal, round, and reactive to light.  Cardiovascular:     Rate and Rhythm: Normal rate and regular rhythm.     Heart sounds: No murmur heard. Pulmonary:     Effort: Pulmonary effort is normal. No respiratory distress.     Breath sounds: Normal breath sounds.  Abdominal:     General: Bowel sounds are normal.  Musculoskeletal:     Cervical back: No rigidity or tenderness.  Lymphadenopathy:     Cervical: No cervical adenopathy.  Skin:    General: Skin is warm and dry.  Neurological:     Mental Status: She is alert and oriented to person, place, and time.  Psychiatric:        Mood and Affect: Mood normal.        Behavior: Behavior normal.      No results found for any visits on 05/24/23.    The 10-year ASCVD risk score (Arnett DK, et al., 2019) is: 4%    Assessment & Plan:   Healthcare maintenance  BMI 40.0-44.9, adult (HCC)  Essential hypertension -     CBC -     Comprehensive metabolic panel -     Urinalysis, Routine w reflex microscopic  Screening cholesterol level -     Lipid panel  Breast cancer screening by mammogram -     Digital Screening Mammogram, Left and Right; Future  Iron deficiency -     Iron, TIBC and Ferritin Panel    Return in about 1 year (around 05/23/2024), or if symptoms worsen or fail to improve.  Fasting labs today.  Given information on 1800-calorie a day diet to lose weight gradually.  Continue Lotensin for hypertension.  Information given on health maintenance and disease prevention.  Mliss Sax, MD

## 2023-05-25 LAB — CBC
HCT: 40.4 % (ref 36.0–46.0)
Hemoglobin: 13.2 g/dL (ref 12.0–15.0)
MCHC: 32.6 g/dL (ref 30.0–36.0)
MCV: 94.3 fl (ref 78.0–100.0)
Platelets: 346 10*3/uL (ref 150.0–400.0)
RBC: 4.28 Mil/uL (ref 3.87–5.11)
RDW: 14.6 % (ref 11.5–15.5)
WBC: 5.1 10*3/uL (ref 4.0–10.5)

## 2023-05-25 LAB — LIPID PANEL
Cholesterol: 176 mg/dL (ref 0–200)
HDL: 60.5 mg/dL (ref >39.00)
LDL Cholesterol: 105 mg/dL — ABNORMAL HIGH (ref 0–99)
NonHDL: 115.83
Total CHOL/HDL Ratio: 3
Triglycerides: 53 mg/dL (ref 0.0–149.0)
VLDL: 10.6 mg/dL (ref 0.0–40.0)

## 2023-05-25 LAB — COMPREHENSIVE METABOLIC PANEL
ALT: 16 U/L (ref 0–35)
AST: 18 U/L (ref 0–37)
Albumin: 4.3 g/dL (ref 3.5–5.2)
Alkaline Phosphatase: 96 U/L (ref 39–117)
BUN: 19 mg/dL (ref 6–23)
CO2: 27 mEq/L (ref 19–32)
Calcium: 9.5 mg/dL (ref 8.4–10.5)
Chloride: 104 meq/L (ref 96–112)
Creatinine, Ser: 0.65 mg/dL (ref 0.40–1.20)
GFR: 94.44 mL/min (ref >60.00)
Glucose, Bld: 85 mg/dL (ref 70–99)
Potassium: 4.4 meq/L (ref 3.5–5.1)
Sodium: 139 meq/L (ref 135–145)
Total Bilirubin: 0.5 mg/dL (ref 0.2–1.2)
Total Protein: 7.2 g/dL (ref 6.0–8.3)

## 2023-05-25 LAB — IRON,TIBC AND FERRITIN PANEL
%SAT: 29 % (ref 16–45)
Ferritin: 136 ng/mL (ref 16–288)
Iron: 97 ug/dL (ref 45–160)
TIBC: 337 ug/dL (ref 250–450)

## 2023-06-14 ENCOUNTER — Ambulatory Visit: Payer: Commercial Managed Care - PPO | Admitting: Sports Medicine

## 2023-06-14 ENCOUNTER — Other Ambulatory Visit (HOSPITAL_COMMUNITY): Payer: Self-pay

## 2023-06-14 VITALS — BP 124/82 | HR 81 | Ht 64.0 in | Wt 246.0 lb

## 2023-06-14 DIAGNOSIS — M546 Pain in thoracic spine: Secondary | ICD-10-CM

## 2023-06-14 DIAGNOSIS — M9902 Segmental and somatic dysfunction of thoracic region: Secondary | ICD-10-CM | POA: Diagnosis not present

## 2023-06-14 DIAGNOSIS — G8929 Other chronic pain: Secondary | ICD-10-CM | POA: Diagnosis not present

## 2023-06-14 DIAGNOSIS — M9908 Segmental and somatic dysfunction of rib cage: Secondary | ICD-10-CM

## 2023-06-14 DIAGNOSIS — M9901 Segmental and somatic dysfunction of cervical region: Secondary | ICD-10-CM | POA: Diagnosis not present

## 2023-06-14 MED ORDER — METHYLPREDNISOLONE ACETATE 80 MG/ML IJ SUSP
80.0000 mg | Freq: Once | INTRAMUSCULAR | Status: AC
Start: 2023-06-14 — End: 2023-06-14
  Administered 2023-06-14: 80 mg via INTRAMUSCULAR

## 2023-06-14 MED ORDER — KETOROLAC TROMETHAMINE 60 MG/2ML IM SOLN
60.0000 mg | Freq: Once | INTRAMUSCULAR | Status: AC
Start: 2023-06-14 — End: 2023-06-14
  Administered 2023-06-14: 60 mg via INTRAMUSCULAR

## 2023-06-14 MED ORDER — METHOCARBAMOL 500 MG PO TABS
750.0000 mg | ORAL_TABLET | Freq: Three times a day (TID) | ORAL | 0 refills | Status: DC
  Filled 2023-06-14: qty 30, 7d supply, fill #0

## 2023-06-14 MED ORDER — MELOXICAM 15 MG PO TABS
15.0000 mg | ORAL_TABLET | Freq: Every day | ORAL | 0 refills | Status: DC
  Filled 2023-06-14: qty 30, 30d supply, fill #0

## 2023-06-14 NOTE — Progress Notes (Addendum)
Hailey [name] D.[facility] [address] [place] [address] Phone: [phone]   Assessment and Plan:     1. Chronic bilateral thoracic back pain 2. Somatic dysfunction of thoracic region 3. Somatic dysfunction of rib region 4. 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  Muscle spasms around this area would not allow for articulation on today's treatment.  We will attempt to use medication to decrease pain and muscle spasms and repeat OMT in 1 week -Tomorrow, start meloxicam 15 mg daily x2 weeks.  If still having pain after 2 weeks, complete 3rd-week of meloxicam. May use remaining meloxicam as needed once daily for pain control.  Do not to use additional NSAIDs while taking meloxicam.  May use Tylenol [phone] mg 2 to 3 times a day for breakthrough pain.  Refill provided - Restart Robaxin 500 mg 3 times a day as needed for muscle spasms.  Refill provided - Patient elected for IM injection of methylprednisone 80 mg/Toradol 60 mg.  Injection given in clinic today and tolerated well.  -Patient provided with work note to be out of work today - Patient has received relief with OMT in the past.  Elects for repeat OMT today.  Tolerated well per note below. - Decision today to treat with OMT was based on Physical Exam  After verbal consent patient was treated with HVLA (high velocity low amplitude), ME (muscle energy), FPR (flex positional release), ST (soft tissue), techniques in cervical, rib, thoracic,   areas. Patient tolerated the procedure well with improvement in symptoms.  Patient educated on potential side effects of soreness and recommended to rest, hydrate, and use Tylenol as needed for pain control.    Pertinent previous records reviewed include none   Follow Up: 1 week for reevaluation.  Could consider repeat OMT    Subjective:   I, Hailey [name], am serving as a Neurosurgeon for Doctor [employer]   Chief Complaint: back pain    HPI:  I, Hailey [name], am serving as a Neurosurgeon for Doctor Richardean Sale   Chief Complaint: thoracic radiculopathy    HPI:    12/05/2021 Patient is a 62 year old female complaining of thoracic radiculopathy. Patient states issue of mid back pain on the right side that radiates around her chest just underneath her breast happened in January .  no numbness or tingling She feels muscle spasms.  Lower doses of meloxicam and methocarbamol does seem to be working, was moving boxes when she was moving .  She is left hand dominant.  She does do some over shoulder lifting through her work.  Review of the plain films arthritis decreased disc space.  Radiologist noted osteopenia   08/10/22 Patient states that she is having the same pain that she had when she came in the first time , she is in flare and wants , muscle and relaxer and tylenol have helped    10/02/2022 Patient states she doing pretty good still has the snag in her back , she can tell a difference when she take meloxicam still feel the knot in her back , feels like a crook in her neck to her shoulder    12/15/2022 Patient states back and shoulder are in flare , has been using old muscle relaxer's and those have helped but has run out  06/14/2023 Patient states upper right side back flare that  radiates to under her breast area   Relevant Historical Information:-Tension, GERD Additional pertinent review of systems negative.   Current Outpatient Medications:    benazepril (LOTENSIN) 20 MG tablet, Take 1 tablet (20 mg total) by mouth daily., Disp: 90 tablet, Rfl: 0   calcium carbonate (OSCAL) 1500 (600 Ca) MG TABS tablet, Take by mouth 2 (two) times daily with a meal., Disp: , Rfl:    cetirizine-pseudoephedrine (ZYRTEC-D) 5-120 MG tablet, Take 1 tablet by mouth daily as needed for allergies., Disp: , Rfl:     cholecalciferol (VITAMIN D3) 25 MCG (1000 UNIT) tablet, Take 1,000 Units by mouth daily., Disp: , Rfl:    FERROUS SULFATE PO, Take 1 tablet by mouth daily. , Disp: , Rfl:    ibuprofen (ADVIL) 200 MG tablet, Take 400 mg by mouth every 6 (six) hours as needed., Disp: , Rfl:    methocarbamol (ROBAXIN) 500 MG tablet, Take 1.5 tablets (750 mg total) by mouth 3 (three) times daily., Disp: 30 tablet, Rfl: 0   Multiple Vitamin (MULTIVITAMIN WITH MINERALS) TABS tablet, Take 1 tablet by mouth daily., Disp: , Rfl:    meloxicam (MOBIC) 15 MG tablet, Take 1 tablet (15 mg total) by mouth daily., Disp: 30 tablet, Rfl: 0   Objective:     Vitals:   06/14/23 0905  BP: 124/82  Pulse: 81  SpO2: 96%  Weight: 246 lb (111.6 kg)  Height: 5\' 4"  (1.626 m)      Body mass index is 42.23 kg/m.    Physical Exam:    General: Well-appearing, cooperative, sitting comfortably in no acute distress.   OMT Physical Exam:   Cervical: TTP paraspinal, C7 RRSL Rib: Bilateral elevated first rib with TTP, worse on right Thoracic: TTP paraspinal, T4 RRSR   Electronically signed by:  Hailey [name] D.[facility] 9:23 AM 06/14/23

## 2023-06-14 NOTE — Addendum Note (Signed)
Addended by: Dierdre Searles on: 06/14/2023 09:42 AM   Modules accepted: Orders

## 2023-06-14 NOTE — Patient Instructions (Addendum)
-   Start meloxicam 15 mg daily x2 weeks.  If still having pain after 2 weeks, complete 3rd-week of meloxicam. May use remaining meloxicam as needed once daily for pain control.  Do not to use additional NSAIDs while taking meloxicam.  May use Tylenol [phone] mg 2 to 3 times a day for breakthrough pain. Robaxin 500 mg three times a day  1 week follow up

## 2023-06-18 NOTE — Progress Notes (Unsigned)
    Hailey [name] D.[facility] [address] [place] [address] Phone: [phone]   Assessment and Plan:     There are no diagnoses linked to this encounter.  ***   Pertinent previous records reviewed include ***   Follow Up: ***     Subjective:   I, Hailey [name], am serving as a Neurosurgeon for Doctor [employer]   Chief Complaint: back pain    HPI:  I, Hailey [name], am serving as a Neurosurgeon for Doctor [employer]   Chief Complaint: thoracic radiculopathy    HPI:    12/05/2021 Patient is a 62 year old female complaining of thoracic radiculopathy. Patient states issue of mid back pain on the right side that radiates around her chest just underneath her breast happened in January .  no numbness or tingling She feels muscle spasms.  Lower doses of meloxicam and methocarbamol does seem to be working, was moving boxes when she was moving .  She is left hand dominant.  She does do some over shoulder lifting through her work.  Review of the plain films arthritis decreased disc space.  Radiologist noted osteopenia   08/10/22 Patient states that she is having the same pain that she had when she came in the first time , she is in flare and wants , muscle and relaxer and tylenol have helped    10/02/2022 Patient states she doing pretty good still has the snag in her back , she can tell a difference when she take meloxicam still feel the knot in her back , feels like a crook in her neck to her shoulder    12/15/2022 Patient states back and shoulder are in flare , has been using old muscle relaxer's and those have helped but has run out   06/14/2023 Patient states upper right side back flare that radiates to under her breast area  06/21/2023 Patient states    Relevant Historical Information:-Tension, GERD  Additional pertinent review of systems negative.   Current Outpatient Medications:    benazepril (LOTENSIN) 20 MG tablet, Take 1  tablet (20 mg total) by mouth daily., Disp: 90 tablet, Rfl: 0   calcium carbonate (OSCAL) 1500 (600 Ca) MG TABS tablet, Take by mouth 2 (two) times daily with a meal., Disp: , Rfl:    cetirizine-pseudoephedrine (ZYRTEC-D) 5-120 MG tablet, Take 1 tablet by mouth daily as needed for allergies., Disp: , Rfl:    cholecalciferol (VITAMIN D3) 25 MCG (1000 UNIT) tablet, Take 1,000 Units by mouth daily., Disp: , Rfl:    FERROUS SULFATE PO, Take 1 tablet by mouth daily. , Disp: , Rfl:    ibuprofen (ADVIL) 200 MG tablet, Take 400 mg by mouth every 6 (six) hours as needed., Disp: , Rfl:    meloxicam (MOBIC) 15 MG tablet, Take 1 tablet (15 mg total) by mouth daily., Disp: 30 tablet, Rfl: 0   methocarbamol (ROBAXIN) 500 MG tablet, Take 1.5 tablets (750 mg total) by mouth 3 (three) times daily., Disp: 30 tablet, Rfl: 0   Multiple Vitamin (MULTIVITAMIN WITH MINERALS) TABS tablet, Take 1 tablet by mouth daily., Disp: , Rfl:    Objective:     There were no vitals filed for this visit.    There is no height or weight on file to calculate BMI.    Physical Exam:    ***   Electronically signed by:  Hailey [name] D.[facility] 7:54 AM 06/18/23

## 2023-06-21 ENCOUNTER — Ambulatory Visit: Payer: Commercial Managed Care - PPO | Admitting: Sports Medicine

## 2023-06-21 VITALS — HR 71 | Ht 64.0 in | Wt 246.0 lb

## 2023-06-21 DIAGNOSIS — M9908 Segmental and somatic dysfunction of rib cage: Secondary | ICD-10-CM

## 2023-06-21 DIAGNOSIS — G8929 Other chronic pain: Secondary | ICD-10-CM | POA: Diagnosis not present

## 2023-06-21 DIAGNOSIS — M546 Pain in thoracic spine: Secondary | ICD-10-CM | POA: Diagnosis not present

## 2023-06-21 DIAGNOSIS — M9902 Segmental and somatic dysfunction of thoracic region: Secondary | ICD-10-CM

## 2023-06-21 DIAGNOSIS — M9901 Segmental and somatic dysfunction of cervical region: Secondary | ICD-10-CM

## 2023-06-21 NOTE — Patient Instructions (Signed)
Continue meloxicam and muscle relaxer 2 week follow up

## 2023-06-27 ENCOUNTER — Other Ambulatory Visit: Payer: Self-pay | Admitting: Family Medicine

## 2023-06-27 ENCOUNTER — Other Ambulatory Visit: Payer: Self-pay

## 2023-06-27 DIAGNOSIS — I10 Essential (primary) hypertension: Secondary | ICD-10-CM

## 2023-06-27 MED FILL — Benazepril HCl Tab 20 MG: ORAL | 90 days supply | Qty: 90 | Fill #0 | Status: AC

## 2023-06-28 ENCOUNTER — Other Ambulatory Visit: Payer: Self-pay

## 2023-07-02 NOTE — Progress Notes (Unsigned)
Hailey [name] [facility] [address] [place] [address] Phone: [phone]   Assessment and Plan:     There are no diagnoses linked to this encounter.  *** - Patient has received relief with OMT in the past.  Elects for repeat OMT today.  Tolerated well per note below. - Decision today to treat with OMT was based on Physical Exam   After verbal consent patient was treated with HVLA (high velocity low amplitude), ME (muscle energy), FPR (flex positional release), ST (soft tissue), PC/PD (Pelvic Compression/ Pelvic Decompression) techniques in cervical, rib, thoracic, lumbar, and pelvic areas. Patient tolerated the procedure well with improvement in symptoms.  Patient educated on potential side effects of soreness and recommended to rest, hydrate, and use Tylenol as needed for pain control.   Pertinent previous records reviewed include ***   Follow Up: ***     Subjective:   I, Hailey [name], am serving as a Neurosurgeon for Hailey [name]   Chief Complaint: back pain    HPI:  I, Hailey [name], am serving as a Neurosurgeon for Hailey Hailey [name]   Chief Complaint: thoracic radiculopathy    HPI:    12/05/2021 Patient is a 62 year old female complaining of thoracic radiculopathy. Patient states issue of mid back pain on the right side that radiates around her chest just underneath her breast happened in January .  no numbness or tingling She feels muscle spasms.  Lower doses of meloxicam and methocarbamol does seem to be working, was moving boxes when she was moving .  She is left hand dominant.  She does do some over shoulder lifting through her work.  Review of the plain films arthritis decreased disc space.  Radiologist noted osteopenia   08/10/22 Patient states that she is having the same pain that she had when she came in the first time , she is in flare and wants , muscle and relaxer and tylenol have helped    10/02/2022 Patient states she  doing pretty good still has the snag in her back , she can tell a difference when she take meloxicam still feel the knot in her back , feels like a crook in her neck to her shoulder    12/15/2022 Patient states back and shoulder are in flare , has been using old muscle relaxer's and those have helped but has run out   06/14/2023 Patient states upper right side back flare that radiates to under her breast area   06/21/2023 Patient states that cocktail and meloxicam, tylenol and muscle relaxer helped. Tightness has eased up some    07/05/2023 Patient states   Relevant Historical Information:-Tension, GERD  Additional pertinent review of systems negative.  Current Outpatient Medications  Medication Sig Dispense Refill   benazepril (LOTENSIN) 20 MG tablet Take 1 tablet (20 mg total) by mouth daily. 90 tablet 0   calcium carbonate (OSCAL) 1500 (600 Ca) MG TABS tablet Take by mouth 2 (two) times daily with a meal.     cetirizine-pseudoephedrine (ZYRTEC-D) 5-120 MG tablet Take 1 tablet by mouth daily as needed for allergies.     cholecalciferol (VITAMIN D3) 25 MCG (1000 UNIT) tablet Take 1,000 Units by mouth daily.     FERROUS SULFATE PO Take 1 tablet by mouth daily.      ibuprofen (ADVIL) 200 MG tablet Take 400 mg by mouth every 6 (six) hours as needed.     meloxicam (MOBIC) 15 MG tablet Take 1 tablet (  15 mg total) by mouth daily. 30 tablet 0   methocarbamol (ROBAXIN) 500 MG tablet Take 1.5 tablets (750 mg total) by mouth 3 (three) times daily. 30 tablet 0   Multiple Vitamin (MULTIVITAMIN WITH MINERALS) TABS tablet Take 1 tablet by mouth daily.     No current facility-administered medications for this visit.      Objective:     There were no vitals filed for this visit.    There is no height or weight on file to calculate BMI.    Physical Exam:     General: Well-appearing, cooperative, sitting comfortably in no acute distress.   OMT Physical Exam:  ASIS Compression Test: Positive  Right Cervical: TTP paraspinal, *** Rib: Bilateral elevated first rib with TTP Thoracic: TTP paraspinal,*** Lumbar: TTP paraspinal,*** Pelvis: Right anterior innominate  Electronically signed by:  Hailey [name] [facility] 7:43 AM 07/02/23

## 2023-07-05 ENCOUNTER — Ambulatory Visit: Payer: Commercial Managed Care - PPO | Admitting: Sports Medicine

## 2023-07-05 VITALS — HR 64 | Ht 64.0 in | Wt 243.0 lb

## 2023-07-05 DIAGNOSIS — M546 Pain in thoracic spine: Secondary | ICD-10-CM | POA: Diagnosis not present

## 2023-07-05 DIAGNOSIS — G8929 Other chronic pain: Secondary | ICD-10-CM | POA: Diagnosis not present

## 2023-07-22 ENCOUNTER — Other Ambulatory Visit: Payer: Self-pay | Admitting: Sports Medicine

## 2023-07-22 ENCOUNTER — Other Ambulatory Visit: Payer: Self-pay

## 2023-08-16 DIAGNOSIS — Z124 Encounter for screening for malignant neoplasm of cervix: Secondary | ICD-10-CM | POA: Diagnosis not present

## 2023-08-16 DIAGNOSIS — Z1151 Encounter for screening for human papillomavirus (HPV): Secondary | ICD-10-CM | POA: Diagnosis not present

## 2023-08-16 DIAGNOSIS — Z01419 Encounter for gynecological examination (general) (routine) without abnormal findings: Secondary | ICD-10-CM | POA: Diagnosis not present

## 2023-08-16 DIAGNOSIS — Z6841 Body Mass Index (BMI) 40.0 and over, adult: Secondary | ICD-10-CM | POA: Diagnosis not present

## 2023-08-16 DIAGNOSIS — Z1231 Encounter for screening mammogram for malignant neoplasm of breast: Secondary | ICD-10-CM | POA: Diagnosis not present

## 2023-08-16 LAB — HM MAMMOGRAPHY: HM Mammogram: NORMAL (ref 0–4)

## 2023-08-16 LAB — HM PAP SMEAR: HM Pap smear: NORMAL

## 2023-08-16 NOTE — Progress Notes (Unsigned)
Hailey [name] [facility] [address] [place] [address] Phone: [phone]   Assessment and Plan:     There are no diagnoses linked to this encounter.  ***   Pertinent previous records reviewed include ***    Follow Up: ***     Subjective:   , Hailey [name], am serving as a Neurosurgeon for Doctor Richardean Sale   Chief Complaint: thoracic radiculopathy    HPI:    12/05/2021 Patient is a 62 year old female complaining of thoracic radiculopathy. Patient states issue of mid back pain on the right side that radiates around her chest just underneath her breast happened in January .  no numbness or tingling She feels muscle spasms.  Lower doses of meloxicam and methocarbamol does seem to be working, was moving boxes when she was moving .  She is left hand dominant.  She does do some over shoulder lifting through her work.  Review of the plain films arthritis decreased disc space.  Radiologist noted osteopenia   08/10/22 Patient states that she is having the same pain that she had when she came in the first time , she is in flare and wants , muscle and relaxer and tylenol have helped    10/02/2022 Patient states she doing pretty good still has the snag in her back , she can tell a difference when she take meloxicam still feel the knot in her back , feels like a crook in her neck to her shoulder    12/15/2022 Patient states back and shoulder are in flare , has been using old muscle relaxer's and those have helped but has run out   06/14/2023 Patient states upper right side back flare that radiates to under her breast area   06/21/2023 Patient states that cocktail and meloxicam, tylenol and muscle relaxer helped. Tightness has eased up some    07/05/2023 Patient states she is feeling better    08/17/2023 Patient states  Relevant Historical Information:-Tension, GERD  Additional pertinent review of systems negative.   Current Outpatient  Medications:    benazepril (LOTENSIN) 20 MG tablet, Take 1 tablet (20 mg total) by mouth daily., Disp: 90 tablet, Rfl: 0   calcium carbonate (OSCAL) 1500 (600 Ca) MG TABS tablet, Take by mouth 2 (two) times daily with a meal., Disp: , Rfl:    cetirizine-pseudoephedrine (ZYRTEC-D) 5-120 MG tablet, Take 1 tablet by mouth daily as needed for allergies., Disp: , Rfl:    cholecalciferol (VITAMIN D3) 25 MCG (1000 UNIT) tablet, Take 1,000 Units by mouth daily., Disp: , Rfl:    FERROUS SULFATE PO, Take 1 tablet by mouth daily. , Disp: , Rfl:    ibuprofen (ADVIL) 200 MG tablet, Take 400 mg by mouth every 6 (six) hours as needed., Disp: , Rfl:    meloxicam (MOBIC) 15 MG tablet, Take 1 tablet (15 mg total) by mouth daily., Disp: 30 tablet, Rfl: 0   methocarbamol (ROBAXIN) 500 MG tablet, Take 1.5 tablets (750 mg total) by mouth 3 (three) times daily., Disp: 30 tablet, Rfl: 0   Multiple Vitamin (MULTIVITAMIN WITH MINERALS) TABS tablet, Take 1 tablet by mouth daily., Disp: , Rfl:    Objective:     There were no vitals filed for this visit.    There is no height or weight on file to calculate BMI.    Physical Exam:    ***   Electronically signed by:  Hailey [name] [facility] 12:30  PM 08/16/23

## 2023-08-17 ENCOUNTER — Other Ambulatory Visit (HOSPITAL_COMMUNITY): Payer: Self-pay

## 2023-08-17 ENCOUNTER — Ambulatory Visit: Payer: Commercial Managed Care - PPO | Admitting: Sports Medicine

## 2023-08-17 VITALS — BP 132/80 | HR 76 | Ht 64.0 in | Wt 243.0 lb

## 2023-08-17 DIAGNOSIS — M25511 Pain in right shoulder: Secondary | ICD-10-CM

## 2023-08-17 DIAGNOSIS — M542 Cervicalgia: Secondary | ICD-10-CM

## 2023-08-17 DIAGNOSIS — G8929 Other chronic pain: Secondary | ICD-10-CM

## 2023-08-17 DIAGNOSIS — M546 Pain in thoracic spine: Secondary | ICD-10-CM

## 2023-08-17 MED ORDER — METHOCARBAMOL 500 MG PO TABS
500.0000 mg | ORAL_TABLET | Freq: Three times a day (TID) | ORAL | 0 refills | Status: DC
  Filled 2023-08-17: qty 90, 30d supply, fill #0

## 2023-08-17 MED ORDER — MELOXICAM 15 MG PO TABS
15.0000 mg | ORAL_TABLET | Freq: Every day | ORAL | 0 refills | Status: DC
  Filled 2023-08-17: qty 30, 30d supply, fill #0

## 2023-08-17 NOTE — Patient Instructions (Signed)
Shoulder HEP 3-4 week follow up

## 2023-08-18 ENCOUNTER — Other Ambulatory Visit (HOSPITAL_COMMUNITY): Payer: Self-pay

## 2023-09-24 ENCOUNTER — Other Ambulatory Visit: Payer: Self-pay

## 2023-09-24 ENCOUNTER — Other Ambulatory Visit: Payer: Self-pay | Admitting: Family Medicine

## 2023-09-24 DIAGNOSIS — I10 Essential (primary) hypertension: Secondary | ICD-10-CM

## 2023-09-24 MED ORDER — BENAZEPRIL HCL 20 MG PO TABS
20.0000 mg | ORAL_TABLET | Freq: Every day | ORAL | 1 refills | Status: DC
  Filled 2023-09-24: qty 90, 90d supply, fill #0
  Filled 2023-12-23: qty 90, 90d supply, fill #1

## 2023-10-18 ENCOUNTER — Ambulatory Visit: Payer: Commercial Managed Care - PPO | Admitting: Family Medicine

## 2023-10-18 ENCOUNTER — Telehealth: Payer: Commercial Managed Care - PPO | Admitting: Family Medicine

## 2023-10-18 ENCOUNTER — Other Ambulatory Visit (HOSPITAL_COMMUNITY): Payer: Self-pay

## 2023-10-18 VITALS — Wt 243.0 lb

## 2023-10-18 DIAGNOSIS — J0100 Acute maxillary sinusitis, unspecified: Secondary | ICD-10-CM

## 2023-10-18 DIAGNOSIS — R051 Acute cough: Secondary | ICD-10-CM | POA: Diagnosis not present

## 2023-10-18 MED ORDER — BENZONATATE 200 MG PO CAPS
200.0000 mg | ORAL_CAPSULE | Freq: Two times a day (BID) | ORAL | 0 refills | Status: DC | PRN
Start: 2023-10-18 — End: 2024-06-09
  Filled 2023-10-18: qty 20, 10d supply, fill #0

## 2023-10-18 MED ORDER — AMOXICILLIN 875 MG PO TABS
875.0000 mg | ORAL_TABLET | Freq: Two times a day (BID) | ORAL | 0 refills | Status: AC
Start: 2023-10-18 — End: 2023-10-28
  Filled 2023-10-18: qty 20, 10d supply, fill #0

## 2023-10-18 NOTE — Progress Notes (Signed)
Established Patient Office Visit   Subjective:  Patient ID: Hailey [name], female    DOB: August 27, 1961  Age: 63 y.o. MRN: [id number]  Chief Complaint  Patient presents with   Cough    Persistence cough mucus, post nasal drainage. Chest congestion x 7 days. Pt says her husband has similar symptoms and has pneumonia.     Cough Pertinent negatives include no chills, eye redness, fever, myalgias, rash, shortness of breath or wheezing.   Encounter Diagnoses  Name Primary?   Acute maxillary sinusitis, recurrence not specified Yes   Acute cough    Presents with a 7 to 8-day history of worsening URI signs and symptoms.  She now has pressure in her cheekbones and across her forehead with yellow rhinorrhea.  She is experiencing postnasal drip and cough productive of yellow phlegm.  She denies wheezing or difficulty breathing.  There has been no fever or chills.  There is right greater than left ear congestion with some lightheadedness.  Her husband is sick at home as well.   Review of Systems  Constitutional: Negative.  Negative for chills and fever.  HENT:  Positive for congestion and sinus pain.   Eyes:  Negative for blurred vision, discharge and redness.  Respiratory:  Positive for cough and sputum production. Negative for shortness of breath and wheezing.   Cardiovascular: Negative.   Gastrointestinal:  Negative for abdominal pain.  Genitourinary: Negative.   Musculoskeletal: Negative.  Negative for myalgias.  Skin:  Negative for rash.  Neurological:  Negative for tingling, loss of consciousness and weakness.  Endo/Heme/Allergies:  Negative for polydipsia.     Current Outpatient Medications:    amoxicillin (AMOXIL) 875 MG tablet, Take 1 tablet (875 mg total) by mouth 2 (two) times daily for 10 days., Disp: 20 tablet, Rfl: 0   benazepril (LOTENSIN) 20 MG tablet, Take 1 tablet (20 mg total) by mouth daily., Disp: 90 tablet, Rfl: 1   benzonatate (TESSALON) 200 MG capsule, Take 1  capsule (200 mg total) by mouth 2 (two) times daily as needed for cough., Disp: 20 capsule, Rfl: 0   calcium carbonate (OSCAL) 1500 (600 Ca) MG TABS tablet, Take by mouth 2 (two) times daily with a meal., Disp: , Rfl:    cetirizine-pseudoephedrine (ZYRTEC-D) 5-120 MG tablet, Take 1 tablet by mouth daily as needed for allergies., Disp: , Rfl:    cholecalciferol (VITAMIN D3) 25 MCG (1000 UNIT) tablet, Take 1,000 Units by mouth daily., Disp: , Rfl:    FERROUS SULFATE PO, Take 1 tablet by mouth daily. , Disp: , Rfl:    meloxicam (MOBIC) 15 MG tablet, Take 1 tablet (15 mg total) by mouth daily., Disp: 30 tablet, Rfl: 0   methocarbamol (ROBAXIN) 500 MG tablet, Take 1 tablet (500 mg total) by mouth 3 (three) times daily., Disp: 90 tablet, Rfl: 0   Multiple Vitamin (MULTIVITAMIN WITH MINERALS) TABS tablet, Take 1 tablet by mouth daily., Disp: , Rfl:    ibuprofen (ADVIL) 200 MG tablet, Take 400 mg by mouth every 6 (six) hours as needed., Disp: , Rfl:    Objective:     Wt 243 lb (110.2 kg)   LMP 03/14/2014 (Approximate) Comment: perimenopause  BMI 41.71 kg/m    Physical Exam Constitutional:      General: She is not in acute distress.    Appearance: Normal appearance. She is not ill-appearing, toxic-appearing or diaphoretic.  HENT:     Head: Normocephalic and atraumatic.     Right Ear: External ear  normal.     Left Ear: External ear normal.  Eyes:     General: No scleral icterus.       Right eye: No discharge.        Left eye: No discharge.     Extraocular Movements: Extraocular movements intact.     Conjunctiva/sclera: Conjunctivae normal.  Pulmonary:     Effort: Pulmonary effort is normal. No respiratory distress.  Skin:    General: Skin is warm and dry.  Neurological:     Mental Status: She is alert and oriented to person, place, and time.  Psychiatric:        Mood and Affect: Mood normal.        Behavior: Behavior normal.      No results found for any visits on  10/18/23.    The 10-year ASCVD risk score (Arnett DK, et al., 2019) is: 7.1%    Assessment & Plan:   Acute maxillary sinusitis, recurrence not specified -     Amoxicillin; Take 1 tablet (875 mg total) by mouth 2 (two) times daily for 10 days.  Dispense: 20 tablet; Refill: 0  Acute cough -     Benzonatate; Take 1 capsule (200 mg total) by mouth 2 (two) times daily as needed for cough.  Dispense: 20 capsule; Refill: 0    Return in about 1 week (around 10/25/2023), or if symptoms worsen or fail to improve.  Will follow-up.  The clinic if not improving by next week.  Mliss Sax, MD  Virtual Visit via Video Note  I connected with Idelia Salm on 10/18/23 at  4:00 PM EST by a video enabled telemedicine application and verified that I am speaking with the correct person using two identifiers.  Location: Patient: home alone in a room.  Provider: work   I discussed the limitations of evaluation and management by telemedicine and the availability of in person appointments. The patient expressed understanding and agreed to proceed.  History of Present Illness:    Observations/Objective:   Assessment and Plan:   Follow Up Instructions:    I discussed the assessment and treatment plan with the patient. The patient was provided an opportunity to ask questions and all were answered. The patient agreed with the plan and demonstrated an understanding of the instructions.   The patient was advised to call back or seek an in-person evaluation if the symptoms worsen or if the condition fails to improve as anticipated.  I provided 20 minutes of non-face-to-face time during this encounter.   Mliss Sax, MD

## 2023-11-11 NOTE — Progress Notes (Deleted)
 Aleen Sells [facility] [address] [place] [address] Phone: [phone]   Assessment and Plan:     There are no diagnoses linked to this encounter.  ***   Pertinent previous records reviewed include ***    Follow Up: ***     Subjective:   , Joban Colledge, am serving as a Neurosurgeon for Doctor Richardean Sale   Chief Complaint: thoracic radiculopathy    HPI:    12/05/2021 Patient is a 63 year old female complaining of thoracic radiculopathy. Patient states issue of mid back pain on the right side that radiates around her chest just underneath her breast happened in January .  no numbness or tingling She feels muscle spasms.  Lower doses of meloxicam and methocarbamol does seem to be working, was moving boxes when she was moving .  She is left hand dominant.  She does do some over shoulder lifting through her work.  Review of the plain films arthritis decreased disc space.  Radiologist noted osteopenia   08/10/22 Patient states that she is having the same pain that she had when she came in the first time , she is in flare and wants , muscle and relaxer and tylenol have helped    10/02/2022 Patient states she doing pretty good still has the snag in her back , she can tell a difference when she take meloxicam still feel the knot in her back , feels like a crook in her neck to her shoulder    12/15/2022 Patient states back and shoulder are in flare , has been using old muscle relaxer's and those have helped but has run out   06/14/2023 Patient states upper right side back flare that radiates to under her breast area   06/21/2023 Patient states that cocktail and meloxicam, tylenol and muscle relaxer helped. Tightness has eased up some    07/05/2023 Patient states she is feeling better    08/17/2023 Patient states same pain flare that is now radiating to her shoulder .   11/12/2023 Patient states    Relevant Historical  Information:-Tension, GERD  Additional pertinent review of systems negative.   Current Outpatient Medications:    benazepril (LOTENSIN) 20 MG tablet, Take 1 tablet (20 mg total) by mouth daily., Disp: 90 tablet, Rfl: 1   benzonatate (TESSALON) 200 MG capsule, Take 1 capsule (200 mg total) by mouth 2 (two) times daily as needed for cough., Disp: 20 capsule, Rfl: 0   calcium carbonate (OSCAL) 1500 (600 Ca) MG TABS tablet, Take by mouth 2 (two) times daily with a meal., Disp: , Rfl:    cetirizine-pseudoephedrine (ZYRTEC-D) 5-120 MG tablet, Take 1 tablet by mouth daily as needed for allergies., Disp: , Rfl:    cholecalciferol (VITAMIN D3) 25 MCG (1000 UNIT) tablet, Take 1,000 Units by mouth daily., Disp: , Rfl:    FERROUS SULFATE PO, Take 1 tablet by mouth daily. , Disp: , Rfl:    ibuprofen (ADVIL) 200 MG tablet, Take 400 mg by mouth every 6 (six) hours as needed., Disp: , Rfl:    meloxicam (MOBIC) 15 MG tablet, Take 1 tablet (15 mg total) by mouth daily., Disp: 30 tablet, Rfl: 0   methocarbamol (ROBAXIN) 500 MG tablet, Take 1 tablet (500 mg total) by mouth 3 (three) times daily., Disp: 90 tablet, Rfl: 0   Multiple Vitamin (MULTIVITAMIN WITH MINERALS) TABS tablet, Take 1 tablet by mouth daily., Disp: , Rfl:    Objective:  There were no vitals filed for this visit.    There is no height or weight on file to calculate BMI.    Physical Exam:    ***   Electronically signed by:  Aleen Sells [facility] 7:39 AM 11/11/23

## 2023-11-12 ENCOUNTER — Ambulatory Visit: Payer: Commercial Managed Care - PPO | Admitting: Sports Medicine

## 2023-11-15 ENCOUNTER — Ambulatory Visit: Payer: Commercial Managed Care - PPO | Admitting: Sports Medicine

## 2023-11-15 ENCOUNTER — Ambulatory Visit: Payer: Commercial Managed Care - PPO

## 2023-11-15 ENCOUNTER — Ambulatory Visit (INDEPENDENT_AMBULATORY_CARE_PROVIDER_SITE_OTHER): Payer: Commercial Managed Care - PPO

## 2023-11-15 ENCOUNTER — Other Ambulatory Visit: Payer: Self-pay | Admitting: Sports Medicine

## 2023-11-15 VITALS — HR 92 | Ht 64.0 in | Wt 243.0 lb

## 2023-11-15 DIAGNOSIS — M25511 Pain in right shoulder: Secondary | ICD-10-CM

## 2023-11-15 DIAGNOSIS — M19011 Primary osteoarthritis, right shoulder: Secondary | ICD-10-CM | POA: Diagnosis not present

## 2023-11-15 DIAGNOSIS — M25512 Pain in left shoulder: Secondary | ICD-10-CM

## 2023-11-15 DIAGNOSIS — G8929 Other chronic pain: Secondary | ICD-10-CM

## 2023-11-15 DIAGNOSIS — M778 Other enthesopathies, not elsewhere classified: Secondary | ICD-10-CM | POA: Diagnosis not present

## 2023-11-15 NOTE — Patient Instructions (Addendum)
PT referral  Restart HEP  Tylenol and meloxicam as needed 2-3 week follow up

## 2023-11-15 NOTE — Progress Notes (Signed)
Hailey [name] [facility] [address] [place] [address] Phone: [phone]   Assessment and Plan:     1. Chronic right shoulder pain -Chronic with exacerbation, subsequent visit - Recurrent right shoulder pain, most consistent with recurrent rotator cuff strain +/- subacromial bursitis +/- biceps tendinitis based on HPI and physical exam - Patient elected for repeat subacromial CSI.  Tolerated well per note below. - Patient has been taking meloxicam 15 mg daily for the past 8 days without significant relief.  May discontinue meloxicam 15 mg daily and instead use meloxicam 15 mg daily as needed for breakthrough pain - Start Tylenol 500 to 1000 mg tablets 2-3 times a day for day-to-day pain relief - Continue HEP for shoulder and start physical therapy.  Referral sent - X-ray obtained in clinic.  My interpretation: No acute fracture or dislocation.  Mild degenerative changes with mild glenoid and acromial spurring - Patient's goals including increasing physical activity for weight loss and ability to care for her husband  Procedure: Subacromial Injection Side: Right  Risks explained and consent was given verbally. The site was cleaned with alcohol prep. A steroid injection was performed from posterior approach using 2mL of 1% lidocaine without epinephrine and 1mL of kenalog 40mg /ml. This was well tolerated and resulted in symptomatic relief.  Needle was removed, hemostasis achieved, and post injection instructions were explained.   Pt was advised to call or return to clinic if these symptoms worsen or fail to improve as anticipated.   Pertinent previous records reviewed include none  Follow Up: 2 to 3 weeks for reevaluation.  Could consider ultrasound versus biceps tendon CSI if no improvement or worsening of symptoms.     Subjective:   , [name], am serving as a Neurosurgeon for Doctor Richardean Sale   Chief Complaint: thoracic  radiculopathy    HPI:    12/05/2021 Patient is a 63 year old female complaining of thoracic radiculopathy. Patient states issue of mid back pain on the right side that radiates around her chest just underneath her breast happened in January .  no numbness or tingling She feels muscle spasms.  Lower doses of meloxicam and methocarbamol does seem to be working, was moving boxes when she was moving .  She is left hand dominant.  She does do some over shoulder lifting through her work.  Review of the plain films arthritis decreased disc space.  Radiologist noted osteopenia   08/10/22 Patient states that she is having the same pain that she had when she came in the first time , she is in flare and wants , muscle and relaxer and tylenol have helped    10/02/2022 Patient states she doing pretty good still has the snag in her back , she can tell a difference when she take meloxicam still feel the knot in her back , feels like a crook in her neck to her shoulder    12/15/2022 Patient states back and shoulder are in flare , has been using old muscle relaxer's and those have helped but has run out   06/14/2023 Patient states upper right side back flare that radiates to under her breast area   06/21/2023 Patient states that cocktail and meloxicam, tylenol and muscle relaxer helped. Tightness has eased up some    07/05/2023 Patient states she is feeling better    08/17/2023 Patient states same pain flare that is now radiating to her shoulder .   11/15/2023 Patient states shoulder  flare that stared last weekend. pain goes down her arm to the bicep . Decreased ROM    Relevant Historical Information:-Tension, GERD   Additional pertinent review of systems negative.   Current Outpatient Medications:    benazepril (LOTENSIN) 20 MG tablet, Take 1 tablet (20 mg total) by mouth daily., Disp: 90 tablet, Rfl: 1   benzonatate (TESSALON) 200 MG capsule, Take 1 capsule (200 mg total) by mouth 2 (two) times daily  as needed for cough., Disp: 20 capsule, Rfl: 0   calcium carbonate (OSCAL) 1500 (600 Ca) MG TABS tablet, Take by mouth 2 (two) times daily with a meal., Disp: , Rfl:    cetirizine-pseudoephedrine (ZYRTEC-D) 5-120 MG tablet, Take 1 tablet by mouth daily as needed for allergies., Disp: , Rfl:    cholecalciferol (VITAMIN D3) 25 MCG (1000 UNIT) tablet, Take 1,000 Units by mouth daily., Disp: , Rfl:    FERROUS SULFATE PO, Take 1 tablet by mouth daily. , Disp: , Rfl:    ibuprofen (ADVIL) 200 MG tablet, Take 400 mg by mouth every 6 (six) hours as needed., Disp: , Rfl:    meloxicam (MOBIC) 15 MG tablet, Take 1 tablet (15 mg total) by mouth daily., Disp: 30 tablet, Rfl: 0   methocarbamol (ROBAXIN) 500 MG tablet, Take 1 tablet (500 mg total) by mouth 3 (three) times daily., Disp: 90 tablet, Rfl: 0   Multiple Vitamin (MULTIVITAMIN WITH MINERALS) TABS tablet, Take 1 tablet by mouth daily., Disp: , Rfl:    Objective:     Vitals:   11/15/23 1608  Pulse: 92  SpO2: 97%  Weight: 243 lb (110.2 kg)  Height: 5\' 4"  (1.626 m)      Body mass index is 41.71 kg/m.    Physical Exam:     Gen: Appears well, nad, nontoxic and pleasant Neuro:sensation intact, strength is 5/5 with df/pf/inv/ev, muscle tone wnl Skin: no suspicious lesion or defmority Psych: A&O, appropriate mood and affect  Right shoulder:  No deformity, swelling or muscle wasting No scapular winging FF 180, abd 90, int 30, ext 70 TTP biceps groove, AC, coracoid, humerus, deltoid NTTP over the Raymond, clavicle, trapezius, cervical spine Positive hawkins, empty can, obriens, crossarm, subscap liftoff, speeds neg neer,  Pain with drop arm test Neg ant drawer, sulcus sign, apprehension Negative Spurling's test bilat FROM of neck    Electronically signed by:  Hailey [name] [facility] 4:40 PM 11/15/23

## 2023-11-24 ENCOUNTER — Ambulatory Visit: Payer: Commercial Managed Care - PPO | Attending: Sports Medicine

## 2023-11-24 DIAGNOSIS — M25511 Pain in right shoulder: Secondary | ICD-10-CM | POA: Insufficient documentation

## 2023-11-24 DIAGNOSIS — G8929 Other chronic pain: Secondary | ICD-10-CM | POA: Insufficient documentation

## 2023-11-24 DIAGNOSIS — M25619 Stiffness of unspecified shoulder, not elsewhere classified: Secondary | ICD-10-CM | POA: Insufficient documentation

## 2023-11-24 NOTE — Therapy (Signed)
 OUTPATIENT PHYSICAL THERAPY SHOULDER EVALUATION   Patient Name: Hailey [name] MRN: [id number] DOB:[birth year], 63 y.o., female Today's Date: 11/25/2023  END OF SESSION:  PT End of Session - 11/25/23 0803     Visit Number 1    Number of Visits 24    Date for PT Re-Evaluation 02/16/24    Progress Note Due on Visit 10    PT Start Time 1445    PT Stop Time 1529    PT Time Calculation (min) 44 min    Activity Tolerance No increased pain    Behavior During Therapy [facility] for tasks assessed/performed             Past Medical History:  Diagnosis Date   Allergy    Anemia    Arthritis    B12 deficiency    CARPAL TUNNEL SYNDROME, BILATERAL 08/04/2010   GERD 03/06/2008   HYPERTENSION 02/10/2007   Obesity    OBESITY NOS 02/10/2007   Sinusitis    Swelling    Vitamin D deficiency    Past Surgical History:  Procedure Laterality Date   CARPAL TUNNEL RELEASE     Both hands   COLONOSCOPY     2012 at [facility]   HEMORRHOID SURGERY     NASAL SINUS SURGERY     TOTAL KNEE ARTHROPLASTY Left 10/16/2019   Procedure: LEFT TOTAL KNEE ARTHROPLASTY;  Surgeon: Tarry Kos, MD;  Location: [facility] OR;  Service: Orthopedics;  Laterality: Left;   Patient Active Problem List   Diagnosis Date Noted   BMI 40.0-44.9, adult (HCC) 05/24/2023   Acute sinusitis 02/23/2023   Postnasal drip 02/23/2023   Osteopenia 11/24/2021   Thoracic radiculopathy 10/23/2021   Thoracic myofascial strain 10/23/2021   Viral syndrome 03/27/2021   Primary osteoarthritis of right knee 09/10/2020   At risk for activity intolerance 09/03/2020   At risk for complication associated with hypotension 07/18/2020   Vitamin D deficiency 05/30/2020   B12 nutritional deficiency 05/30/2020   Anemia 05/30/2020   Status post total left knee replacement 10/16/2019   Primary osteoarthritis of left knee 09/06/2019   Healthcare maintenance 02/24/2019   Chronic sinusitis 10/04/2018   CARPAL TUNNEL SYNDROME, BILATERAL 08/04/2010   Viral  upper respiratory tract infection 10/16/2008   GERD 03/06/2008   Class 2 obesity due to excess calories without serious comorbidity with body mass index (BMI) of 38.0 to 38.9 in adult 02/10/2007   Essential hypertension 02/10/2007    PCP: Dr. Nadene Rubins  REFERRING PROVIDER: Dr. Richardean Sale  REFERRING DIAG: [phone] (ICD-10-CM) - Chronic right shoulder pain   THERAPY DIAG:  Chronic right shoulder pain - Plan: PT plan of care cert/re-cert  Limited range of motion (ROM) of shoulder - Plan: PT plan of care cert/re-cert  Rationale for Evaluation and Treatment: Rehabilitation  ONSET DATE: November 2024  SUBJECTIVE:  SUBJECTIVE STATEMENT: Patient reports pain started in November in Right shoulder and just had another injection last week with some improved pain. She reports still working full time but bothersome right shoulder - worse with all active arm movement.    Hand dominance: Left  PERTINENT HISTORY: Patient is a 63 year old female with referral for Right chronic shoulder pain by Orthopedics. Started back in November pain in right shoulder down into bicep region and some "charlie horse pain in my upper trap."  Per medical report from Dr. Chrisandra Netters on 2/17:  1. Chronic right shoulder pain -Chronic with exacerbation, subsequent visit - Recurrent right shoulder pain, most consistent with recurrent rotator cuff strain +/- subacromial bursitis +/- biceps tendinitis based on HPI and physical exam - Patient elected for repeat subacromial CSI.  Tolerated well per note below. - Patient has been taking meloxicam 15 mg daily for the past 8 days without significant relief.  May discontinue meloxicam 15 mg daily and instead use meloxicam 15 mg daily as needed for breakthrough pain - Start Tylenol 500  to 1000 mg tablets 2-3 times a day for day-to-day pain relief - Continue HEP for shoulder and start physical therapy.  Referral sent - X-ray obtained in clinic.  My interpretation: No acute fracture or dislocation.  Mild degenerative changes with mild glenoid and acromial spurring - Patient's goals including increasing physical activity for weight loss and ability to care for her husband    PAIN:  Are you having pain? Yes: NPRS scale: current= 3/10; best= 3/10; worst= 10/10 Pain location: "like I have a charlie horse"  Pain description: Achy Aggravating factors: Working, reaching overhead, sleeping Relieving factors: meloxicam, rest  PRECAUTIONS: None  RED FLAGS: None   WEIGHT BEARING RESTRICTIONS: No  FALLS:  Has patient fallen in last 6 months? No  LIVING ENVIRONMENT: Lives with: lives with their spouse Lives in: House/apartment Stairs: Yes: Internal: 14 steps; can reach both; external- flat entry; back deck step approx 6 with Rails B Has following equipment at home: Elliptical Machine  OCCUPATION: Works at [employer] in [employer] full time.  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   NEXT MD VISIT:   OBJECTIVE:  Note: Objective measures were completed at Evaluation unless otherwise noted.  DIAGNOSTIC FINDINGS:  Per Dr. Jean Rosenthal note on 11/15/2023-X-ray obtained in clinic. My interpretation: No acute fracture or dislocation. Mild degenerative changes with mild glenoid and acromial spurring   PATIENT SURVEYS:  Quick Dash 88.6- A QuickDASH score is interpreted as a measure of upper limb disability, where a higher score indicates a greater level of disability, ranging from 0 (no disability) to 100 (most severe disability); essentially, the higher the score, the more limitations a person experiences in their arm, shoulder, and hand function due to pain or other  issues  COGNITION: Overall cognitive status: Within functional limits for tasks assessed     SENSATION: WFL  POSTURE: Forward head, rounded shoulder   UPPER EXTREMITY ROM:   Active ROM Right eval Left eval  Shoulder flexion 145 *painful   Shoulder extension    Shoulder abduction 130* painful   Shoulder adduction    Shoulder internal rotation WNL   Shoulder external rotation 80 *painful   Elbow flexion    Elbow extension    Wrist flexion    Wrist extension    Wrist ulnar deviation    Wrist radial deviation    Wrist pronation  Wrist supination    (Blank rows = not tested)  UPPER EXTREMITY MMT:  MMT Right eval Left eval  Shoulder flexion 3- 5  Shoulder extension    Shoulder abduction 3- 5  Shoulder adduction    Shoulder internal rotation 5 5  Shoulder external rotation 4 5  Middle trapezius    Lower trapezius    Elbow flexion 4 5  Elbow extension 5 5  Wrist flexion    Wrist extension    Wrist ulnar deviation    Wrist radial deviation    Wrist pronation    Wrist supination    Grip strength (lbs)    (Blank rows = not tested)  .  SHOULDER SPECIAL TESTS: Impingement tests: Neer impingement test: negative, Hawkins/Kennedy impingement test: positive , and Painful arc test: negative Instability tests: Sulcus sign: negative Rotator cuff assessment: Empty can test: positive  and Full can test: positive  Biceps assessment: Yergason's test: negative and Speed's test: positive   JOINT MOBILITY TESTING:  Hypmobile with Inferior GH joint glide No pain with PA/AP GH joint mobs  PALPATION:  Mild report of tenderness along anterior and lateral shoulder yet most tender along upper trap musculature with multiple taut bands.                                                                                                                              TREATMENT DATE: 11/24/2023   PATIENT EDUCATION: Education details: Purpose of PT; Plan of care; Ed in anatomy of  shoulder complex and RC muscles; symptoms of impingement and pain management stragtegies Person educated: Patient Education method: Explanation Education comprehension: verbalized understanding  HOME EXERCISE PROGRAM: To be initiated next visit  ASSESSMENT:  CLINICAL IMPRESSION: Patient is a 63 y.o. Female who was seen today for physical therapy evaluation and treatment for diagonsis of chronic right shoulder pain. She presents with painful movement yet able to achieve near full range ROM. Special test were positive for impingement and patient most limited with overhead reaching. PT examination reveals deficits in ROM, some right side muscle weakness and limited functional abilities with right arm affecting her at work and home. Pt will benefit from PT services to address deficits in strength, mobility, and pain in order to return to full function at work and home with less shoulder pain.  OBJECTIVE IMPAIRMENTS: decreased ROM, decreased strength, hypomobility, impaired UE functional use, and pain.   ACTIVITY LIMITATIONS: carrying, lifting, dressing, reach over head, and caring for others  PARTICIPATION LIMITATIONS: cleaning, laundry, shopping, community activity, occupation, and yard work  PERSONAL FACTORS: 1-2 comorbidities: OA and HTn  are also affecting patient's functional outcome.   REHAB POTENTIAL: Good  CLINICAL DECISION MAKING: Stable/uncomplicated  EVALUATION COMPLEXITY: Low   GOALS: Goals reviewed with patient? Yes  SHORT TERM GOALS: Target date: 01/05/2024  Pt will be independent with HEP in order to improve strength and decrease pain in order to improve pain-free function at home and work.  Baseline: EVAL= No formal HEP in place Goal status: INITIAL   LONG TERM GOALS: Target date: 02/16/2024  Pt will decrease quick DASH score by at least 8% in order to demonstrate clinically significant reduction in disability. Baseline: EVAL= 86% Goal status: INITIAL  2.  Pt will  report decrease worst right shoulder pain as reported on NPRS by at least 3 points in order to demonstrate clinically significant reduction in pain. Baseline: EVAL= worst= 10/10 Goal status: INITIAL  3.  Patient will demonstrated improved functional ROM achieving 160 deg of overhead reaching without report of pain for optimal functional use of Right shoulder at work and home.  Baseline: EVAL=  Goal status: INITIAL  4.  Patient will report only mild difficulty sleeping at night due to right shoulder pain for improved ability to obtain better rest.   Baseline: EVAL= Patient reports "severe" difficulty sleeping due to R shoulder pain Goal status: INITIAL   PLAN:  PT FREQUENCY: 1-2x/week  PT DURATION: 12 weeks  PLANNED INTERVENTIONS: 97164- PT Re-evaluation, 97110-Therapeutic exercises, 97530- Therapeutic activity, 97112- Neuromuscular re-education, 97535- Self Care, 95284- Manual therapy, [id number]- Electrical stimulation (manual), 97035- Ultrasound, Patient/Family education, Dry Needling, Joint mobilization, Joint manipulation, Spinal manipulation, Spinal mobilization, Cryotherapy, and Moist heat  PLAN FOR NEXT SESSION: Manual therapy for ROM/Pain relief, Complete any further special testing, Therex/Theract for posture/RC strengthening- and add to HEP as appropriate.    Lenda Kelp, PT 11/25/2023, 8:50 AM

## 2023-11-25 ENCOUNTER — Ambulatory Visit: Payer: Commercial Managed Care - PPO

## 2023-11-25 DIAGNOSIS — G8929 Other chronic pain: Secondary | ICD-10-CM | POA: Diagnosis not present

## 2023-11-25 DIAGNOSIS — M25619 Stiffness of unspecified shoulder, not elsewhere classified: Secondary | ICD-10-CM

## 2023-11-25 DIAGNOSIS — M25511 Pain in right shoulder: Secondary | ICD-10-CM | POA: Diagnosis not present

## 2023-11-25 NOTE — Therapy (Signed)
 OUTPATIENT PHYSICAL THERAPY SHOULDER TREATMENT   Patient Name: Hailey [name] MRN: [id number] DOB:[birth year], 63 y.o., female Today's Date: 11/25/2023  END OF SESSION:  PT End of Session - 11/25/23 1538     Visit Number 2    Number of Visits 24    Date for PT Re-Evaluation 02/16/24    Progress Note Due on Visit 10    PT Start Time 1450    PT Stop Time 1530    PT Time Calculation (min) 40 min    Activity Tolerance No increased pain    Behavior During Therapy [facility] for tasks assessed/performed              Past Medical History:  Diagnosis Date   Allergy    Anemia    Arthritis    B12 deficiency    CARPAL TUNNEL SYNDROME, BILATERAL 08/04/2010   GERD 03/06/2008   HYPERTENSION 02/10/2007   Obesity    OBESITY NOS 02/10/2007   Sinusitis    Swelling    Vitamin D deficiency    Past Surgical History:  Procedure Laterality Date   CARPAL TUNNEL RELEASE     Both hands   COLONOSCOPY     2012 at [facility]   HEMORRHOID SURGERY     NASAL SINUS SURGERY     TOTAL KNEE ARTHROPLASTY Left 10/16/2019   Procedure: LEFT TOTAL KNEE ARTHROPLASTY;  Surgeon: Tarry Kos, MD;  Location: [facility] OR;  Service: Orthopedics;  Laterality: Left;   Patient Active Problem List   Diagnosis Date Noted   BMI 40.0-44.9, adult (HCC) 05/24/2023   Acute sinusitis 02/23/2023   Postnasal drip 02/23/2023   Osteopenia 11/24/2021   Thoracic radiculopathy 10/23/2021   Thoracic myofascial strain 10/23/2021   Viral syndrome 03/27/2021   Primary osteoarthritis of right knee 09/10/2020   At risk for activity intolerance 09/03/2020   At risk for complication associated with hypotension 07/18/2020   Vitamin D deficiency 05/30/2020   B12 nutritional deficiency 05/30/2020   Anemia 05/30/2020   Status post total left knee replacement 10/16/2019   Primary osteoarthritis of left knee 09/06/2019   Healthcare maintenance 02/24/2019   Chronic sinusitis 10/04/2018   CARPAL TUNNEL SYNDROME, BILATERAL 08/04/2010   Viral  upper respiratory tract infection 10/16/2008   GERD 03/06/2008   Class 2 obesity due to excess calories without serious comorbidity with body mass index (BMI) of 38.0 to 38.9 in adult 02/10/2007   Essential hypertension 02/10/2007    PCP: Dr. Nadene Rubins  REFERRING PROVIDER: Dr. Richardean Sale  REFERRING DIAG: [phone] (ICD-10-CM) - Chronic right shoulder pain   THERAPY DIAG:  Chronic right shoulder pain  Limited range of motion (ROM) of shoulder  Rationale for Evaluation and Treatment: Rehabilitation  ONSET DATE: November 2024  SUBJECTIVE:  SUBJECTIVE STATEMENT:  From today: Patient sore after initial visit but less overall right bicep soreness reported today.    From Eval:Patient reports pain started in November in Right shoulder and just had another injection last week with some improved pain. She reports still working full time but bothersome right shoulder - worse with all active arm movement.    Hand dominance: Left  PERTINENT HISTORY: Patient is a 63 year old female with referral for Right chronic shoulder pain by Orthopedics. Started back in November pain in right shoulder down into bicep region and some "charlie horse pain in my upper trap."  Per medical report from Dr. Chrisandra Netters on 2/17:  1. Chronic right shoulder pain -Chronic with exacerbation, subsequent visit - Recurrent right shoulder pain, most consistent with recurrent rotator cuff strain +/- subacromial bursitis +/- biceps tendinitis based on HPI and physical exam - Patient elected for repeat subacromial CSI.  Tolerated well per note below. - Patient has been taking meloxicam 15 mg daily for the past 8 days without significant relief.  May discontinue meloxicam 15 mg daily and instead use meloxicam 15 mg daily as needed  for breakthrough pain - Start Tylenol 500 to 1000 mg tablets 2-3 times a day for day-to-day pain relief - Continue HEP for shoulder and start physical therapy.  Referral sent - X-ray obtained in clinic.  My interpretation: No acute fracture or dislocation.  Mild degenerative changes with mild glenoid and acromial spurring - Patient's goals including increasing physical activity for weight loss and ability to care for her husband    PAIN:  Are you having pain? Yes: NPRS scale: current= 3/10; best= 3/10; worst= 10/10 Pain location: "like I have a charlie horse"  Pain description: Achy Aggravating factors: Working, reaching overhead, sleeping Relieving factors: meloxicam, rest  PRECAUTIONS: None  RED FLAGS: None   WEIGHT BEARING RESTRICTIONS: No  FALLS:  Has patient fallen in last 6 months? No  LIVING ENVIRONMENT: Lives with: lives with their spouse Lives in: House/apartment Stairs: Yes: Internal: 14 steps; can reach both; external- flat entry; back deck step approx 6 with Rails B Has following equipment at home: Elliptical Machine  OCCUPATION: Works at [employer] in [employer] full time.  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   NEXT MD VISIT:   OBJECTIVE:  Note: Objective measures were completed at Evaluation unless otherwise noted.  DIAGNOSTIC FINDINGS:  Per Dr. Jean Rosenthal note on 11/15/2023-X-ray obtained in clinic. My interpretation: No acute fracture or dislocation. Mild degenerative changes with mild glenoid and acromial spurring   PATIENT SURVEYS:  Quick Dash 88.6- A QuickDASH score is interpreted as a measure of upper limb disability, where a higher score indicates a greater level of disability, ranging from 0 (no disability) to 100 (most severe disability); essentially, the higher the score, the more limitations a person experiences in their arm, shoulder, and hand function  due to pain or other issues  COGNITION: Overall cognitive status: Within functional limits for tasks assessed     SENSATION: WFL  POSTURE: Forward head, rounded shoulder   UPPER EXTREMITY ROM:   Active ROM Right eval Left eval  Shoulder flexion 145 *painful   Shoulder extension    Shoulder abduction 130* painful   Shoulder adduction    Shoulder internal rotation WNL   Shoulder external rotation 80 *painful   Elbow flexion    Elbow extension    Wrist flexion  Wrist extension    Wrist ulnar deviation    Wrist radial deviation    Wrist pronation    Wrist supination    (Blank rows = not tested)  UPPER EXTREMITY MMT:  MMT Right eval Left eval  Shoulder flexion 3- 5  Shoulder extension    Shoulder abduction 3- 5  Shoulder adduction    Shoulder internal rotation 5 5  Shoulder external rotation 4 5  Middle trapezius    Lower trapezius    Elbow flexion 4 5  Elbow extension 5 5  Wrist flexion    Wrist extension    Wrist ulnar deviation    Wrist radial deviation    Wrist pronation    Wrist supination    Grip strength (lbs)    (Blank rows = not tested)  .  SHOULDER SPECIAL TESTS: Impingement tests: Neer impingement test: negative, Hawkins/Kennedy impingement test: positive , and Painful arc test: negative Instability tests: Sulcus sign: negative Rotator cuff assessment: Empty can test: positive  and Full can test: positive  Biceps assessment: Yergason's test: negative and Speed's test: positive   JOINT MOBILITY TESTING:  Hypmobile with Inferior GH joint glide No pain with PA/AP GH joint mobs  PALPATION:  Mild report of tenderness along anterior and lateral shoulder yet most tender along upper trap musculature with multiple taut bands.                                                                                                                              TREATMENT DATE: 2/267/2025  Manual therapy: (supine) R shoulder - Manual long axis distraction  GH joint- hold 20 sec x 3 -Grade 2-3 inf GH joint mobs x 20 bouts -Grade 2-3 PA/AP GH joint mobs x 20 bouts -Mobilization with movement- Right shoulder flex/abd with inferior glide through around 90 deg of elevation x  .  Therapeutic exercise:  PROM to R shoulder: Flex/ABD/ER/IR x 10 min  Self care/home management:   Added gentle ROM to HEP (See HEP)  Reminders to perform as painfree as possible   PATIENT EDUCATION: Education details: Purpose of PT; Plan of care; Ed in anatomy of shoulder complex and RC muscles; symptoms of impingement and pain management stragtegies Person educated: Patient Education method: Explanation Education comprehension: verbalized understanding  HOME EXERCISE PROGRAM: Access Code: [email or web address] URL: [email or web address] Date: 11/25/2023 Prepared by: Maureen Ralphs  Exercises - Standing Shoulder External and Internal Rotation AROM  - 1 x daily - 3 sets - 10 reps - Supine Shoulder Internal Rotation Stretch  - 1 x daily - 3 sets - 10 reps - Supine Shoulder External Rotation Stretch  - 1 x daily - 3 sets - 10 reps  ASSESSMENT:  CLINICAL IMPRESSION: Patient presents in good spirits for 2nd visit today. Treatment focused on painfree mobility and later education in some painfree initial RC strengthening. She performed well without pain during any exercise. She did have some endrange with R shoulder flex/ABD and  ER. Pt will benefit from PT services to address deficits in strength, mobility, and pain in order to return to full function at work and home with less shoulder pain.  OBJECTIVE IMPAIRMENTS: decreased ROM, decreased strength, hypomobility, impaired UE functional use, and pain.   ACTIVITY LIMITATIONS: carrying, lifting, dressing, reach over head, and caring for others  PARTICIPATION LIMITATIONS: cleaning, laundry, shopping, community activity, occupation, and yard work  PERSONAL FACTORS: 1-2 comorbidities: OA and HTn  are also  affecting patient's functional outcome.   REHAB POTENTIAL: Good  CLINICAL DECISION MAKING: Stable/uncomplicated  EVALUATION COMPLEXITY: Low   GOALS: Goals reviewed with patient? Yes  SHORT TERM GOALS: Target date: 01/05/2024  Pt will be independent with HEP in order to improve strength and decrease pain in order to improve pain-free function at home and work. Baseline: EVAL= No formal HEP in place Goal status: INITIAL   LONG TERM GOALS: Target date: 02/16/2024  Pt will decrease quick DASH score by at least 8% in order to demonstrate clinically significant reduction in disability. Baseline: EVAL= 86% Goal status: INITIAL  2.  Pt will report decrease worst right shoulder pain as reported on NPRS by at least 3 points in order to demonstrate clinically significant reduction in pain. Baseline: EVAL= worst= 10/10 Goal status: INITIAL  3.  Patient will demonstrated improved functional ROM achieving 160 deg of overhead reaching without report of pain for optimal functional use of Right shoulder at work and home.  Baseline: EVAL=  Goal status: INITIAL  4.  Patient will report only mild difficulty sleeping at night due to right shoulder pain for improved ability to obtain better rest.   Baseline: EVAL= Patient reports "severe" difficulty sleeping due to R shoulder pain Goal status: INITIAL   PLAN:  PT FREQUENCY: 1-2x/week  PT DURATION: 12 weeks  PLANNED INTERVENTIONS: 97164- PT Re-evaluation, 97110-Therapeutic exercises, 97530- Therapeutic activity, 97112- Neuromuscular re-education, 97535- Self Care, 96295- Manual therapy, [id number]- Electrical stimulation (manual), 97035- Ultrasound, Patient/Family education, Dry Needling, Joint mobilization, Joint manipulation, Spinal manipulation, Spinal mobilization, Cryotherapy, and Moist heat  PLAN FOR NEXT SESSION: Manual therapy for ROM/Pain relief, Therex/Theract for posture/RC strengthening- and add to HEP as appropriate.    Lenda Kelp, PT 11/25/2023, 5:21 PM

## 2023-11-28 ENCOUNTER — Other Ambulatory Visit: Payer: Self-pay | Admitting: Sports Medicine

## 2023-11-29 ENCOUNTER — Other Ambulatory Visit (HOSPITAL_COMMUNITY): Payer: Self-pay

## 2023-11-29 MED ORDER — METHOCARBAMOL 500 MG PO TABS
500.0000 mg | ORAL_TABLET | Freq: Three times a day (TID) | ORAL | 0 refills | Status: DC | PRN
  Filled 2023-11-29: qty 90, 30d supply, fill #0

## 2023-12-01 ENCOUNTER — Ambulatory Visit: Payer: Commercial Managed Care - PPO | Admitting: Physical Therapy

## 2023-12-07 ENCOUNTER — Ambulatory Visit: Payer: Commercial Managed Care - PPO | Attending: Sports Medicine | Admitting: Physical Therapy

## 2023-12-07 DIAGNOSIS — G8929 Other chronic pain: Secondary | ICD-10-CM | POA: Diagnosis not present

## 2023-12-07 DIAGNOSIS — M25511 Pain in right shoulder: Secondary | ICD-10-CM | POA: Diagnosis not present

## 2023-12-07 DIAGNOSIS — M25619 Stiffness of unspecified shoulder, not elsewhere classified: Secondary | ICD-10-CM | POA: Diagnosis not present

## 2023-12-07 NOTE — Therapy (Unsigned)
 OUTPATIENT PHYSICAL THERAPY SHOULDER TREATMENT   Patient Name: Hailey [name] MRN: [id number] DOB:[birth year], 63 y.o., female Today's Date: 12/07/2023  END OF SESSION:  PT End of Session - 12/07/23 1538     Visit Number 3    Number of Visits 24    Date for PT Re-Evaluation 02/16/24    Progress Note Due on Visit 10    PT Start Time 1535    PT Stop Time 1615    PT Time Calculation (min) 40 min    Activity Tolerance No increased pain    Behavior During Therapy [facility] for tasks assessed/performed              Past Medical History:  Diagnosis Date   Allergy    Anemia    Arthritis    B12 deficiency    CARPAL TUNNEL SYNDROME, BILATERAL 08/04/2010   GERD 03/06/2008   HYPERTENSION 02/10/2007   Obesity    OBESITY NOS 02/10/2007   Sinusitis    Swelling    Vitamin D deficiency    Past Surgical History:  Procedure Laterality Date   CARPAL TUNNEL RELEASE     Both hands   COLONOSCOPY     2012 at [facility]   HEMORRHOID SURGERY     NASAL SINUS SURGERY     TOTAL KNEE ARTHROPLASTY Left 10/16/2019   Procedure: LEFT TOTAL KNEE ARTHROPLASTY;  Surgeon: Tarry Kos, MD;  Location: [facility] OR;  Service: Orthopedics;  Laterality: Left;   Patient Active Problem List   Diagnosis Date Noted   BMI 40.0-44.9, adult (HCC) 05/24/2023   Acute sinusitis 02/23/2023   Postnasal drip 02/23/2023   Osteopenia 11/24/2021   Thoracic radiculopathy 10/23/2021   Thoracic myofascial strain 10/23/2021   Viral syndrome 03/27/2021   Primary osteoarthritis of right knee 09/10/2020   At risk for activity intolerance 09/03/2020   At risk for complication associated with hypotension 07/18/2020   Vitamin D deficiency 05/30/2020   B12 nutritional deficiency 05/30/2020   Anemia 05/30/2020   Status post total left knee replacement 10/16/2019   Primary osteoarthritis of left knee 09/06/2019   Healthcare maintenance 02/24/2019   Chronic sinusitis 10/04/2018   CARPAL TUNNEL SYNDROME, BILATERAL 08/04/2010   Viral  upper respiratory tract infection 10/16/2008   GERD 03/06/2008   Class 2 obesity due to excess calories without serious comorbidity with body mass index (BMI) of 38.0 to 38.9 in adult 02/10/2007   Essential hypertension 02/10/2007    PCP: Dr. Nadene Rubins  REFERRING PROVIDER: Dr. Richardean Sale  REFERRING DIAG: [phone] (ICD-10-CM) - Chronic right shoulder pain   THERAPY DIAG:  Chronic right shoulder pain  Limited range of motion (ROM) of shoulder  Rationale for Evaluation and Treatment: Rehabilitation  ONSET DATE: November 2024  SUBJECTIVE:  SUBJECTIVE STATEMENT:  From today: Pt reports that Husband fell on Thursday night. Pt reports that she was able to help him up but reports significant pain follow assist to lift Husband. 10/10.  She also reports that he went for Emergency endoscopic surgery to address intestinal bleeding. Has recovered slightly but will have follow-up tomorrow.  States that she is looking forward to gardening, but worried about bothering  shoulder.  Pt reports that she took meloxican this morning, and is planning on applying KT tape this evening. States that pain was 6/10 this morning, but 3/10 at start of PT session.      From Eval:Patient reports pain started in November in Right shoulder and just had another injection last week with some improved pain. She reports still working full time but bothersome right shoulder - worse with all active arm movement.    Hand dominance: Left  PERTINENT HISTORY: Patient is a 63 year old female with referral for Right chronic shoulder pain by Orthopedics. Started back in November pain in right shoulder down into bicep region and some "charlie horse pain in my upper trap."  Per medical report from Dr. Chrisandra Netters on 2/17:  1.  Chronic right shoulder pain -Chronic with exacerbation, subsequent visit - Recurrent right shoulder pain, most consistent with recurrent rotator cuff strain +/- subacromial bursitis +/- biceps tendinitis based on HPI and physical exam - Patient elected for repeat subacromial CSI.  Tolerated well per note below. - Patient has been taking meloxicam 15 mg daily for the past 8 days without significant relief.  May discontinue meloxicam 15 mg daily and instead use meloxicam 15 mg daily as needed for breakthrough pain - Start Tylenol 500 to 1000 mg tablets 2-3 times a day for day-to-day pain relief - Continue HEP for shoulder and start physical therapy.  Referral sent - X-ray obtained in clinic.  My interpretation: No acute fracture or dislocation.  Mild degenerative changes with mild glenoid and acromial spurring - Patient's goals including increasing physical activity for weight loss and ability to care for her husband    PAIN:  Are you having pain? Yes: NPRS scale: current= 3/10; best= 3/10; worst= 10/10 Pain location: "like I have a charlie horse"  Pain description: Achy Aggravating factors: Working, reaching overhead, sleeping Relieving factors: meloxicam, rest  PRECAUTIONS: None  RED FLAGS: None   WEIGHT BEARING RESTRICTIONS: No  FALLS:  Has patient fallen in last 6 months? No  LIVING ENVIRONMENT: Lives with: lives with their spouse Lives in: House/apartment Stairs: Yes: Internal: 14 steps; can reach both; external- flat entry; back deck step approx 6 with Rails B Has following equipment at home: Elliptical Machine  OCCUPATION: Works at [employer] in [employer] full time.  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   NEXT MD VISIT:   OBJECTIVE:  Note: Objective measures were completed at Evaluation unless otherwise noted.  DIAGNOSTIC FINDINGS:  Per Dr. Jean Rosenthal note on  11/15/2023-X-ray obtained in clinic. My interpretation: No acute fracture or dislocation. Mild degenerative changes with mild glenoid and acromial spurring   PATIENT SURVEYS:  Quick Dash 88.6- A QuickDASH score is interpreted as a measure of upper limb disability, where a higher score indicates a greater level of disability, ranging from 0 (no disability) to 100 (most severe disability); essentially, the higher the score, the more limitations a person experiences in their arm, shoulder, and hand function due to pain or other  issues  COGNITION: Overall cognitive status: Within functional limits for tasks assessed     SENSATION: WFL  POSTURE: Forward head, rounded shoulder   UPPER EXTREMITY ROM:   Active ROM Right eval Left eval  Shoulder flexion 145 *painful   Shoulder extension    Shoulder abduction 130* painful   Shoulder adduction    Shoulder internal rotation WNL   Shoulder external rotation 80 *painful   Elbow flexion    Elbow extension    Wrist flexion    Wrist extension    Wrist ulnar deviation    Wrist radial deviation    Wrist pronation    Wrist supination    (Blank rows = not tested)  UPPER EXTREMITY MMT:  MMT Right eval Left eval  Shoulder flexion 3- 5  Shoulder extension    Shoulder abduction 3- 5  Shoulder adduction    Shoulder internal rotation 5 5  Shoulder external rotation 4 5  Middle trapezius    Lower trapezius    Elbow flexion 4 5  Elbow extension 5 5  Wrist flexion    Wrist extension    Wrist ulnar deviation    Wrist radial deviation    Wrist pronation    Wrist supination    Grip strength (lbs)    (Blank rows = not tested)  .  SHOULDER SPECIAL TESTS: Impingement tests: Neer impingement test: negative, Hawkins/Kennedy impingement test: positive , and Painful arc test: negative Instability tests: Sulcus sign: negative Rotator cuff assessment: Empty can test: positive  and Full can test: positive  Biceps assessment: Yergason's test:  negative and Speed's test: positive   JOINT MOBILITY TESTING:  Hypmobile with Inferior GH joint glide No pain with PA/AP GH joint mobs  PALPATION:  Mild report of tenderness along anterior and lateral shoulder yet most tender along upper trap musculature with multiple taut bands.                                                                                                                              TREATMENT DATE: 2/267/2025  Manual therapy: (supine) R shoulder - Manual long axis distraction GH joint- hold 20 sec x 3 -Grade 2-3 inf GH joint mobs x 20 bouts -Grade 2-3 PA/AP GH joint mobs x 20 bouts -Mobilization with movement- Right shoulder flex/abd with inferior glide through around 90 deg of elevation x  . STM to UT and cervical paraspinals x 5 min .  Trigger Point Dry Needling  Initial Treatment: Pt instructed on Dry Needling rational, procedures, and possible side effects. Pt instructed to expect mild to moderate muscle soreness later in the day and/or into the next day.  Pt instructed in methods to reduce muscle soreness. Pt instructed to continue prescribed HEP. Because Dry Needling was performed over or adjacent to a lung field, pt was educated on S/S of pneumothorax and to seek immediate medical attention should they occur.  Patient was educated on signs and symptoms of  infection and other risk factors and advised to seek medical attention should they occur.  Patient verbalized understanding of these instructions and education.   Patient Verbal Consent Given: Yes Education Handout Provided: Yes Muscles Treated: UT  Electrical Stimulation Performed: No Treatment Response/Outcome: 2 local twitch responses    Therapeutic exercise:  PROM to R shoulder: Flex/ABD/ER/IR x 2 min - Supine Isometric Shoulder Extension with Towel  10 reps - 3 sec  hold - Supine Isometric Shoulder Adduction with Towel Roll  - 10 reps - Standing Isometric Shoulder Internal Rotation at  Doorway   - 10 reps - Standing Isometric Shoulder External Rotation with Doorway  - 10 reps Self care/home management:     PATIENT EDUCATION: Education details: Purpose of PT; Plan of care; Ed in anatomy of shoulder complex and RC muscles; symptoms of impingement and pain management stragtegies Person educated: Patient Education method: Explanation Education comprehension: verbalized understanding   Trigger Point Dry Needling  What is Trigger Point Dry Needling (DN)? DN is a physical therapy technique used to treat muscle pain and dysfunction. Specifically, DN helps deactivate muscle trigger points (muscle knots).  A thin filiform needle is used to penetrate the skin and stimulate the underlying trigger point. The goal is for a local twitch response (LTR) to occur and for the trigger point to relax. No medication of any kind is injected during the procedure.   What Does Trigger Point Dry Needling Feel Like?  The procedure feels different for each individual patient. Some patients report that they do not actually feel the needle enter the skin and overall the process is not painful. Very mild bleeding may occur. However, many patients feel a deep cramping in the muscle in which the needle was inserted. This is the local twitch response.   How Will I feel after the treatment? Soreness is normal, and the onset of soreness may not occur for a few hours. Typically this soreness does not last longer than two days.  Bruising is uncommon, however; ice can be used to decrease any possible bruising.  In rare cases feeling tired or nauseous after the treatment is normal. In addition, your symptoms may get worse before they get better, this period will typically not last longer than 24 hours.   What Can I do After My Treatment? Increase your hydration by drinking more water for the next 24 hours.  You may place ice or heat on the areas treated that have become sore, however, do not use heat on  inflamed or bruised areas. Heat often brings more relief post needling. You can continue your regular activities, but vigorous activity is not recommended initially after the treatment for 24 hours. DN is best combined with other physical therapy such as strengthening, stretching, and other therapies.   What are the complications? While your therapist has had extensive training in minimizing the risks of trigger point dry needling, it is important to understand the risks of any procedure.  Risks include bleeding, pain, fatigue, hematoma, infection, vertigo, nausea or nerve involvement. Monitor for any changes to your skin or sensation. Contact your therapist or MD with concerns.  A rare but serious complication is a pneumothorax over or near your middle and upper chest and back If you have dry needling in this area, monitor for the following symptoms: Shortness of breath on exertion and/or Difficulty taking a deep breath and/or Chest Pain and/or A dry cough If any of the above symptoms develop, please go to the nearest emergency  room or call 911. Tell them you had dry needling over your thorax and report any symptoms you are having. Please follow-up with your treating therapist after you complete the medical evaluation.   HOME EXERCISE PROGRAM: Access Code: [email or web address] URL: [email or web address] Date: 12/09/2023 Prepared by: Grier Rocher  Exercises - Standing Shoulder External and Internal Rotation AROM  - 1 x daily - 3 sets - 10 reps - Supine Shoulder Internal Rotation Stretch  - 1 x daily - 3 sets - 10 reps - Supine Shoulder External Rotation Stretch  - 1 x daily - 3 sets - 10 reps - Supine Isometric Shoulder Extension with Towel  - 1 x daily - 7 x weekly - 3 sets - 10 reps - 3 sec  hold - Supine Isometric Shoulder Adduction with Towel Roll  - 1 x daily - 7 x weekly - 3 sets - 10 reps - Standing Isometric Shoulder Internal Rotation at Doorway  - 1 x daily - 7 x weekly - 3  sets - 10 reps - Standing Isometric Shoulder External Rotation with Doorway  - 1 x daily - 7 x weekly - 3 sets - 10 reps  ASSESSMENT:  CLINICAL IMPRESSION: Patient presents in good spirits for 3rd visit today. PT focused on pain management and improved activation of parascapular muscles. Responded well to TDN for pain management after increased pain from pulling husband off floor over the weekend. Encouraged to limit high resistance movements as possible at work while recovering from additional strain in UT. Pt will benefit from PT services to address deficits in strength, mobility, and pain in order to return to full function at work and home with less shoulder pain.  OBJECTIVE IMPAIRMENTS: decreased ROM, decreased strength, hypomobility, impaired UE functional use, and pain.   ACTIVITY LIMITATIONS: carrying, lifting, dressing, reach over head, and caring for others  PARTICIPATION LIMITATIONS: cleaning, laundry, shopping, community activity, occupation, and yard work  PERSONAL FACTORS: 1-2 comorbidities: OA and HTn  are also affecting patient's functional outcome.   REHAB POTENTIAL: Good  CLINICAL DECISION MAKING: Stable/uncomplicated  EVALUATION COMPLEXITY: Low   GOALS: Goals reviewed with patient? Yes  SHORT TERM GOALS: Target date: 01/05/2024  Pt will be independent with HEP in order to improve strength and decrease pain in order to improve pain-free function at home and work. Baseline: EVAL= No formal HEP in place Goal status: INITIAL   LONG TERM GOALS: Target date: 02/16/2024  Pt will decrease quick DASH score by at least 8% in order to demonstrate clinically significant reduction in disability. Baseline: EVAL= 86% Goal status: INITIAL  2.  Pt will report decrease worst right shoulder pain as reported on NPRS by at least 3 points in order to demonstrate clinically significant reduction in pain. Baseline: EVAL= worst= 10/10 Goal status: INITIAL  3.  Patient will  demonstrated improved functional ROM achieving 160 deg of overhead reaching without report of pain for optimal functional use of Right shoulder at work and home.  Baseline: EVAL=  Goal status: INITIAL  4.  Patient will report only mild difficulty sleeping at night due to right shoulder pain for improved ability to obtain better rest.   Baseline: EVAL= Patient reports "severe" difficulty sleeping due to R shoulder pain Goal status: INITIAL   PLAN:  PT FREQUENCY: 1-2x/week  PT DURATION: 12 weeks  PLANNED INTERVENTIONS: 97164- PT Re-evaluation, 97110-Therapeutic exercises, 97530- Therapeutic activity, 97112- Neuromuscular re-education, 97535- Self Care, [id number]- Manual therapy, [id number]- Electrical stimulation (manual), [id number]- Ultrasound, Patient/Family education, Dry Needling, Joint  mobilization, Joint manipulation, Spinal manipulation, Spinal mobilization, Cryotherapy, and Moist heat  PLAN FOR NEXT SESSION:   Manual therapy for ROM/Pain relief, Therex/Theract for posture/RC strengthening- and add to HEP as appropriate.    Golden Pop, PT 12/07/2023, 3:39 PM

## 2023-12-08 ENCOUNTER — Ambulatory Visit: Admitting: Physical Therapy

## 2023-12-09 ENCOUNTER — Encounter: Payer: Commercial Managed Care - PPO | Admitting: Physical Therapy

## 2023-12-10 NOTE — Progress Notes (Signed)
 Hailey [name] D.[facility] [address] [place] [address] Phone: [phone]   Assessment and Plan:    1. Chronic right shoulder pain 2. Biceps tendinitis of right upper extremity  -Chronic with exacerbation, subsequent visit - Overall improvement in right shoulder pain after subacromial CSI on 11/15/2023, finishing meloxicam 15 mg course, continuing HEP and starting physical therapy.  Patient continues to have anterior shoulder pain most consistent with biceps tendinitis - Elected for biceps tendon CSI.  Tolerated well per note below - May continue meloxicam 15 mg daily as needed for pain relief.  Recommend limiting chronic NSAIDs 1-2 doses per week - Continue Tylenol for day-to-day pain relief - Continue HEP and physical therapy for shoulder  Procedure: Ultrasound Guided Biceps Tendon Sheath Injection Side: Right Diagnosis: Biceps tendinitis [place] Indication:  - accuracy is paramount for diagnosis - to ensure therapeutic efficacy or procedural success - to reduce procedural risk  After explaining the procedure, viable alternatives, risks, and answering any questions, consent was given verbally. The site was cleaned with chlorhexidine prep. An ultrasound transducer was placed on the anterior shoulder.  The biceps tendon and bicipital groove was identified.  A steroid injection was performed under ultrasound guidance with sterile technique using  1ml of 1% lidocaine without epinephrine and 40 mg of triamcinolone (KENALOG) 40mg /ml. This was well tolerated and resulted in  relief.  Needle was removed and dressing placed and post injection instructions were given including  a discussion of likely return of pain today after the anesthetic wears off (with the possibility of worsened pain) until the steroid starts to work in 1-3 days.   Pt was advised to call or return to clinic if these symptoms worsen or fail to improve as anticipated.   Pertinent  previous records reviewed include none  Follow Up: 3 to 4 weeks for reevaluation.  Could consider advanced imaging   Subjective:   I, Hailey [name] am a scribe for Dr. Jean [name].     Chief Complaint: thoracic radiculopathy    HPI:    12/05/2021 Patient is a 63 year old female complaining of thoracic radiculopathy. Patient states issue of mid back pain on the right side that radiates around her chest just underneath her breast happened in January .  no numbness or tingling She feels muscle spasms.  Lower doses of meloxicam and methocarbamol does seem to be working, was moving boxes when she was moving .  She is left hand dominant.  She does do some over shoulder lifting through her work.  Review of the plain films arthritis decreased disc space.  Radiologist noted osteopenia   08/10/22 Patient states that she is having the same pain that she had when she came in the first time , she is in flare and wants , muscle and relaxer and tylenol have helped    10/02/2022 Patient states she doing pretty good still has the snag in her back , she can tell a difference when she take meloxicam still feel the knot in her back , feels like a crook in her neck to her shoulder    12/15/2022 Patient states back and shoulder are in flare , has been using old muscle relaxer's and those have helped but has run out   06/14/2023 Patient states upper right side back flare that radiates to under her breast area   06/21/2023 Patient states that cocktail and meloxicam, tylenol and muscle relaxer helped. Tightness has eased up some  07/05/2023 Patient states she is feeling better    08/17/2023 Patient states same pain flare that is now radiating to her shoulder .    11/15/2023 Patient states shoulder flare that stared last weekend. pain goes down her arm to the bicep . Decreased ROM   12/13/2023 Patient states it is ok. Doing PT and they do dry needling at the top of the shoulder. Compression sleeve helps. Had a  little set back when her husband fell and she tried to catch him. It felt like an electric shock running up her arm. Feels like it is her bicep instead of her shoulder. Did really well for a long time when she got the steroid shots in November. The most recent one took about two weeks to set in. Bought K tape but hasn't applied it yet.    Relevant Historical Information:-Tension, GERD    Additional pertinent review of systems negative.   Current Outpatient Medications:    benazepril (LOTENSIN) 20 MG tablet, Take 1 tablet (20 mg total) by mouth daily., Disp: 90 tablet, Rfl: 1   benzonatate (TESSALON) 200 MG capsule, Take 1 capsule (200 mg total) by mouth 2 (two) times daily as needed for cough., Disp: 20 capsule, Rfl: 0   calcium carbonate (OSCAL) 1500 (600 Ca) MG TABS tablet, Take by mouth 2 (two) times daily with a meal., Disp: , Rfl:    cetirizine-pseudoephedrine (ZYRTEC-D) 5-120 MG tablet, Take 1 tablet by mouth daily as needed for allergies., Disp: , Rfl:    cholecalciferol (VITAMIN D3) 25 MCG (1000 UNIT) tablet, Take 1,000 Units by mouth daily., Disp: , Rfl:    FERROUS SULFATE PO, Take 1 tablet by mouth daily. , Disp: , Rfl:    meloxicam (MOBIC) 15 MG tablet, Take 1 tablet (15 mg total) by mouth daily., Disp: 30 tablet, Rfl: 0   methocarbamol (ROBAXIN) 500 MG tablet, Take 1 tablet (500 mg total) by mouth every 8 (eight) hours as needed for muscle spasms., Disp: 90 tablet, Rfl: 0   Multiple Vitamin (MULTIVITAMIN WITH MINERALS) TABS tablet, Take 1 tablet by mouth daily., Disp: , Rfl:    ibuprofen (ADVIL) 200 MG tablet, Take 400 mg by mouth every 6 (six) hours as needed., Disp: , Rfl:    Objective:     Vitals:   12/13/23 1541  BP: 124/70  Pulse: 95  SpO2: 94%  Weight: 249 lb (112.9 kg)  Height: 5\' 4"  (1.626 m)      Body mass index is 42.74 kg/m.    Physical Exam:    Gen: Appears well, nad, nontoxic and pleasant Neuro:sensation intact, strength is 5/5 with df/pf/inv/ev, muscle  tone wnl Skin: no suspicious lesion or defmority Psych: A&O, appropriate mood and affect   Right shoulder:  No deformity, swelling or muscle wasting No scapular winging FF 180, abd 90, int 30, ext 70 TTP biceps groove, AC, coracoid, humerus, deltoid NTTP over the [place], clavicle, trapezius, cervical spine Positive hawkins, empty can, obriens, crossarm, subscap liftoff, speeds neg neer,  Pain with drop arm test Neg ant drawer, sulcus sign, apprehension Negative Spurling's test bilat FROM of neck     Electronically signed by:  Hailey [name] D.[facility] 4:12 PM 12/13/23

## 2023-12-13 ENCOUNTER — Other Ambulatory Visit: Payer: Self-pay

## 2023-12-13 ENCOUNTER — Ambulatory Visit: Admitting: Sports Medicine

## 2023-12-13 VITALS — BP 124/70 | HR 95 | Ht 64.0 in | Wt 249.0 lb

## 2023-12-13 DIAGNOSIS — M7521 Bicipital tendinitis, right shoulder: Secondary | ICD-10-CM | POA: Diagnosis not present

## 2023-12-13 DIAGNOSIS — G8929 Other chronic pain: Secondary | ICD-10-CM | POA: Diagnosis not present

## 2023-12-13 DIAGNOSIS — M25511 Pain in right shoulder: Secondary | ICD-10-CM | POA: Diagnosis not present

## 2023-12-13 NOTE — Patient Instructions (Addendum)
 Continue Hep and PT Follow up in 3 to 4 weeks.

## 2023-12-14 ENCOUNTER — Ambulatory Visit: Payer: Commercial Managed Care - PPO | Admitting: Physical Therapy

## 2023-12-14 DIAGNOSIS — M25619 Stiffness of unspecified shoulder, not elsewhere classified: Secondary | ICD-10-CM

## 2023-12-14 DIAGNOSIS — G8929 Other chronic pain: Secondary | ICD-10-CM

## 2023-12-14 DIAGNOSIS — M25511 Pain in right shoulder: Secondary | ICD-10-CM | POA: Diagnosis not present

## 2023-12-14 NOTE — Therapy (Unsigned)
 OUTPATIENT PHYSICAL THERAPY SHOULDER TREATMENT   Patient Name: Hailey [name] MRN: [id number] DOB:[birth year], 63 y.o., female Today's Date: 12/14/2023  END OF SESSION:  PT End of Session - 12/14/23 1620     Visit Number 4    Number of Visits 24    Date for PT Re-Evaluation 02/16/24    Progress Note Due on Visit 10    PT Start Time 1619    PT Stop Time 1700    PT Time Calculation (min) 41 min    Activity Tolerance No increased pain    Behavior During Therapy [facility] for tasks assessed/performed              Past Medical History:  Diagnosis Date   Allergy    Anemia    Arthritis    B12 deficiency    CARPAL TUNNEL SYNDROME, BILATERAL 08/04/2010   GERD 03/06/2008   HYPERTENSION 02/10/2007   Obesity    OBESITY NOS 02/10/2007   Sinusitis    Swelling    Vitamin D deficiency    Past Surgical History:  Procedure Laterality Date   CARPAL TUNNEL RELEASE     Both hands   COLONOSCOPY     2012 at [facility]   HEMORRHOID SURGERY     NASAL SINUS SURGERY     TOTAL KNEE ARTHROPLASTY Left 10/16/2019   Procedure: LEFT TOTAL KNEE ARTHROPLASTY;  Surgeon: Tarry Kos, MD;  Location: [facility] OR;  Service: Orthopedics;  Laterality: Left;   Patient Active Problem List   Diagnosis Date Noted   BMI 40.0-44.9, adult (HCC) 05/24/2023   Acute sinusitis 02/23/2023   Postnasal drip 02/23/2023   Osteopenia 11/24/2021   Thoracic radiculopathy 10/23/2021   Thoracic myofascial strain 10/23/2021   Viral syndrome 03/27/2021   Primary osteoarthritis of right knee 09/10/2020   At risk for activity intolerance 09/03/2020   At risk for complication associated with hypotension 07/18/2020   Vitamin D deficiency 05/30/2020   B12 nutritional deficiency 05/30/2020   Anemia 05/30/2020   Status post total left knee replacement 10/16/2019   Primary osteoarthritis of left knee 09/06/2019   Healthcare maintenance 02/24/2019   Chronic sinusitis 10/04/2018   CARPAL TUNNEL SYNDROME, BILATERAL 08/04/2010   Viral  upper respiratory tract infection 10/16/2008   GERD 03/06/2008   Class 2 obesity due to excess calories without serious comorbidity with body mass index (BMI) of 38.0 to 38.9 in adult 02/10/2007   Essential hypertension 02/10/2007    PCP: Dr. Nadene Rubins  REFERRING PROVIDER: Dr. Richardean Sale  REFERRING DIAG: [phone] (ICD-10-CM) - Chronic right shoulder pain   THERAPY DIAG:  Limited range of motion (ROM) of shoulder  Chronic right shoulder pain  Rationale for Evaluation and Treatment: Rehabilitation  ONSET DATE: November 2024  SUBJECTIVE:  SUBJECTIVE STATEMENT:  From today: Pt reports that she received cortisone Steroid injection yesterday from Dr Jean Rosenthal at R biceps tendon.     Pain reported at night 6/10. No pain reported at start of Treatment.    From Eval:Patient reports pain started in November in Right shoulder and just had another injection last week with some improved pain. She reports still working full time but bothersome right shoulder - worse with all active arm movement.    Hand dominance: Left  PERTINENT HISTORY: Patient is a 63 year old female with referral for Right chronic shoulder pain by Orthopedics. Started back in November pain in right shoulder down into bicep region and some "charlie horse pain in my upper trap."  Per medical report from Dr. Chrisandra Netters on 2/17:  1. Chronic right shoulder pain -Chronic with exacerbation, subsequent visit - Recurrent right shoulder pain, most consistent with recurrent rotator cuff strain +/- subacromial bursitis +/- biceps tendinitis based on HPI and physical exam - Patient elected for repeat subacromial CSI.  Tolerated well per note below. - Patient has been taking meloxicam 15 mg daily for the past 8 days without significant  relief.  May discontinue meloxicam 15 mg daily and instead use meloxicam 15 mg daily as needed for breakthrough pain - Start Tylenol 500 to 1000 mg tablets 2-3 times a day for day-to-day pain relief - Continue HEP for shoulder and start physical therapy.  Referral sent - X-ray obtained in clinic.  My interpretation: No acute fracture or dislocation.  Mild degenerative changes with mild glenoid and acromial spurring - Patient's goals including increasing physical activity for weight loss and ability to care for her husband    PAIN:  Are you having pain? Yes: NPRS scale: current= 3/10; best= 3/10; worst= 10/10 Pain location: "like I have a charlie horse"  Pain description: Achy Aggravating factors: Working, reaching overhead, sleeping Relieving factors: meloxicam, rest  PRECAUTIONS: None  RED FLAGS: None   WEIGHT BEARING RESTRICTIONS: No  FALLS:  Has patient fallen in last 6 months? No  LIVING ENVIRONMENT: Lives with: lives with their spouse Lives in: House/apartment Stairs: Yes: Internal: 14 steps; can reach both; external- flat entry; back deck step approx 6 with Rails B Has following equipment at home: Elliptical Machine  OCCUPATION: Works at [employer] in [employer] full time.  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   NEXT MD VISIT:   OBJECTIVE:  Note: Objective measures were completed at Evaluation unless otherwise noted.  DIAGNOSTIC FINDINGS:  Per Dr. Jean Rosenthal note on 11/15/2023-X-ray obtained in clinic. My interpretation: No acute fracture or dislocation. Mild degenerative changes with mild glenoid and acromial spurring   PATIENT SURVEYS:  Quick Dash 88.6- A QuickDASH score is interpreted as a measure of upper limb disability, where a higher score indicates a greater level of disability, ranging from 0 (no disability) to 100 (most severe disability); essentially, the higher  the score, the more limitations a person experiences in their arm, shoulder, and hand function due to pain or other issues  COGNITION: Overall cognitive status: Within functional limits for tasks assessed     SENSATION: WFL  POSTURE: Forward head, rounded shoulder   UPPER EXTREMITY ROM:   Active ROM Right eval Left eval  Shoulder flexion 145 *painful   Shoulder extension    Shoulder abduction 130* painful   Shoulder adduction    Shoulder internal rotation WNL   Shoulder external  rotation 80 *painful   Elbow flexion    Elbow extension    Wrist flexion    Wrist extension    Wrist ulnar deviation    Wrist radial deviation    Wrist pronation    Wrist supination    (Blank rows = not tested)  UPPER EXTREMITY MMT:  MMT Right eval Left eval  Shoulder flexion 3- 5  Shoulder extension    Shoulder abduction 3- 5  Shoulder adduction    Shoulder internal rotation 5 5  Shoulder external rotation 4 5  Middle trapezius    Lower trapezius    Elbow flexion 4 5  Elbow extension 5 5  Wrist flexion    Wrist extension    Wrist ulnar deviation    Wrist radial deviation    Wrist pronation    Wrist supination    Grip strength (lbs)    (Blank rows = not tested)  .  SHOULDER SPECIAL TESTS: Impingement tests: Neer impingement test: negative, Hawkins/Kennedy impingement test: positive , and Painful arc test: negative Instability tests: Sulcus sign: negative Rotator cuff assessment: Empty can test: positive  and Full can test: positive  Biceps assessment: Yergason's test: negative and Speed's test: positive   JOINT MOBILITY TESTING:  Hypmobile with Inferior GH joint glide No pain with PA/AP GH joint mobs  PALPATION:  Mild report of tenderness along anterior and lateral shoulder yet most tender along upper trap musculature with multiple taut bands.                                                                                                                               TREATMENT DATE: 2/267/2025  Seated: Seated shoulder Abduction to slide across mat table with 5 sec hold. X 8 reports lat stretch.  Seated shoulder flexion slide across table x 8, no pain  AROM bil shoulder flexion x 12  Shoulder ER YTB x 12  Attempted resisted scapular punch, reports UT pain in the R.  AROM sitting serratus punch x 10, difficulty with preventing shoulder elevation Low row YTB x 12   Supine:  Isometric shoulder extension  Serratus punch x 12  Pec stretch x 20 sec AROM in pain free range.   Manual supine:  Cervical upglides 30 sec per segment  Lateral mobs 20 sec C4-6 bil  UT stretch 2 x 25 sec bil  Cervical rotation stretch x 30 sec bil  STM for UT release in R UT and L cervical capitus. X 4 min  Seated Manual:  Infrapinatus STM with TP release 2 x 90 sec.  Cross frictional massage to teres major/minor and UT with 60sec TP release each.   Pt reports decreased stiffness upon completion of PT treatment.   PATIENT EDUCATION: Education details: Purpose of PT; Plan of care; Ed in anatomy of shoulder complex and RC muscles; symptoms of impingement and pain management stragtegies Person educated: Patient Education method: Explanation Education comprehension: verbalized understanding  HOME EXERCISE PROGRAM: Access Code: [email or web address] URL: [email or web address] Date: 12/09/2023 Prepared by: Grier Rocher  Exercises - Standing Shoulder External and Internal Rotation AROM  - 1 x daily - 3 sets - 10 reps - Supine Shoulder Internal Rotation Stretch  - 1 x daily - 3 sets - 10 reps - Supine Shoulder External Rotation Stretch  - 1 x daily - 3 sets - 10 reps - Supine Isometric Shoulder Extension with Towel  - 1 x daily - 7 x weekly - 3 sets - 10 reps - 3 sec  hold - Supine Isometric Shoulder Adduction with Towel Roll  - 1 x daily - 7 x weekly - 3 sets - 10 reps - Standing Isometric Shoulder Internal Rotation at Doorway  - 1 x daily - 7 x weekly - 3 sets -  10 reps - Standing Isometric Shoulder External Rotation with Doorway  - 1 x daily - 7 x weekly - 3 sets - 10 reps  ASSESSMENT:  CLINICAL IMPRESSION: Patient reports to treatment for shoulder pain management, with good motivation. Pt states that bicep is feeling much better since CSI from Dr Jean Rosenthal yesterday, but states that UT is still  tight. Tolerated treatment well and reports no pain and reduced stiffness upon completion of PT treatment. Pt will benefit from PT services to address deficits in strength, mobility, and pain in order to return to full function at work and home with less shoulder pain.  OBJECTIVE IMPAIRMENTS: decreased ROM, decreased strength, hypomobility, impaired UE functional use, and pain.   ACTIVITY LIMITATIONS: carrying, lifting, dressing, reach over head, and caring for others  PARTICIPATION LIMITATIONS: cleaning, laundry, shopping, community activity, occupation, and yard work  PERSONAL FACTORS: 1-2 comorbidities: OA and HTn  are also affecting patient's functional outcome.   REHAB POTENTIAL: Good  CLINICAL DECISION MAKING: Stable/uncomplicated  EVALUATION COMPLEXITY: Low   GOALS: Goals reviewed with patient? Yes  SHORT TERM GOALS: Target date: 01/05/2024  Pt will be independent with HEP in order to improve strength and decrease pain in order to improve pain-free function at home and work. Baseline: EVAL= No formal HEP in place Goal status: INITIAL   LONG TERM GOALS: Target date: 02/16/2024  Pt will decrease quick DASH score by at least 8% in order to demonstrate clinically significant reduction in disability. Baseline: EVAL= 86% Goal status: INITIAL  2.  Pt will report decrease worst right shoulder pain as reported on NPRS by at least 3 points in order to demonstrate clinically significant reduction in pain. Baseline: EVAL= worst= 10/10 Goal status: INITIAL  3.  Patient will demonstrated improved functional ROM achieving 160 deg of overhead reaching  without report of pain for optimal functional use of Right shoulder at work and home.  Baseline: EVAL=  Goal status: INITIAL  4.  Patient will report only mild difficulty sleeping at night due to right shoulder pain for improved ability to obtain better rest.   Baseline: EVAL= Patient reports "severe" difficulty sleeping due to R shoulder pain Goal status: INITIAL   PLAN:  PT FREQUENCY: 1-2x/week  PT DURATION: 12 weeks  PLANNED INTERVENTIONS: 97164- PT Re-evaluation, 97110-Therapeutic exercises, 97530- Therapeutic activity, 97112- Neuromuscular re-education, 97535- Self Care, [id number]- Manual therapy, [id number]- Electrical stimulation (manual), 97035- Ultrasound, Patient/Family education, Dry Needling, Joint mobilization, Joint manipulation, Spinal manipulation, Spinal mobilization, Cryotherapy, and Moist heat  PLAN FOR NEXT SESSION:   Manual therapy for ROM/Pain relief, Therex/Theract for posture/RC strengthening- and add to HEP as appropriate.  TPDN for R shoulder pain  Golden Pop, PT 12/14/2023, 4:21 PM

## 2023-12-23 ENCOUNTER — Ambulatory Visit: Payer: Commercial Managed Care - PPO | Admitting: Physical Therapy

## 2023-12-23 DIAGNOSIS — G8929 Other chronic pain: Secondary | ICD-10-CM

## 2023-12-23 DIAGNOSIS — M25619 Stiffness of unspecified shoulder, not elsewhere classified: Secondary | ICD-10-CM

## 2023-12-23 DIAGNOSIS — M25511 Pain in right shoulder: Secondary | ICD-10-CM | POA: Diagnosis not present

## 2023-12-23 NOTE — Therapy (Unsigned)
 OUTPATIENT PHYSICAL THERAPY SHOULDER TREATMENT   Patient Name: Hailey [name] MRN: [id number] DOB:1960-11-02, 63 y.o., female Today's Date: 12/23/2023  END OF SESSION:  PT End of Session - 12/23/23 1538     Visit Number 5    Number of Visits 24    Date for PT Re-Evaluation 02/16/24    Progress Note Due on Visit 10    PT Start Time 1536    PT Stop Time 1615    PT Time Calculation (min) 39 min    Activity Tolerance [name] increased pain    Behavior During Therapy [facility] for tasks assessed/performed              Past Medical History:  Diagnosis Date   Allergy    Anemia    Arthritis    B12 deficiency    CARPAL TUNNEL SYNDROME, BILATERAL 08/04/2010   GERD 03/06/2008   HYPERTENSION 02/10/2007   Obesity    OBESITY NOS 02/10/2007   Sinusitis    Swelling    Vitamin D deficiency    Past Surgical History:  Procedure Laterality Date   CARPAL TUNNEL RELEASE     Both hands   COLONOSCOPY     2012 at [facility]   HEMORRHOID SURGERY     NASAL SINUS SURGERY     TOTAL KNEE ARTHROPLASTY Left 10/16/2019   Procedure: LEFT TOTAL KNEE ARTHROPLASTY;  Surgeon: Tarry Kos, MD;  Location: [facility] OR;  Service: Orthopedics;  Laterality: Left;   Patient Active Problem List   Diagnosis Date Noted   BMI 40.0-44.9, adult (HCC) 05/24/2023   Acute sinusitis 02/23/2023   Postnasal drip 02/23/2023   Osteopenia 11/24/2021   Thoracic radiculopathy 10/23/2021   Thoracic myofascial strain 10/23/2021   Viral syndrome 03/27/2021   Primary osteoarthritis of right knee 09/10/2020   At risk for activity intolerance 09/03/2020   At risk for complication associated with hypotension 07/18/2020   Vitamin D deficiency 05/30/2020   B12 nutritional deficiency 05/30/2020   Anemia 05/30/2020   Status post total left knee replacement 10/16/2019   Primary osteoarthritis of left knee 09/06/2019   Healthcare maintenance 02/24/2019   Chronic sinusitis 10/04/2018   CARPAL TUNNEL SYNDROME, BILATERAL 08/04/2010   Viral  upper respiratory tract infection 10/16/2008   GERD 03/06/2008   Class 2 obesity due to excess calories without serious comorbidity with body mass index (BMI) of 38.0 to 38.9 in adult 02/10/2007   Essential hypertension 02/10/2007    PCP: Dr. Nadene Rubins  REFERRING PROVIDER: Dr. Richardean Sale  REFERRING DIAG: [phone] (ICD-10-CM) - Chronic right shoulder pain   THERAPY DIAG:  Limited range of motion (ROM) of shoulder  Chronic right shoulder pain  Rationale for Evaluation and Treatment: Rehabilitation  ONSET DATE: November 2024  SUBJECTIVE:  SUBJECTIVE STATEMENT:  From today: Pt reports that she is doing better. States that she has been consistent with HEP. Has been using KT tape to stabilize the L shoulder. Reports [name] pain with movement while gardening over the last week.  Also states that husband has been approved for medicine to address intestinal bleeding. [name] results from biopsy.    From Eval:Patient reports pain started in November in Right shoulder and just had another injection last week with some improved pain. She reports still working full time but bothersome right shoulder - worse with all active arm movement.    Hand dominance: Left  PERTINENT HISTORY: Patient is a 63 year old female with referral for Right chronic shoulder pain by Orthopedics. Started back in November pain in right shoulder down into bicep region and some "charlie horse pain in my upper trap."  Per medical report from Dr. Chrisandra Netters on 2/17:  1. Chronic right shoulder pain -Chronic with exacerbation, subsequent visit - Recurrent right shoulder pain, most consistent with recurrent rotator cuff strain +/- subacromial bursitis +/- biceps tendinitis based on HPI and physical exam - Patient elected for repeat  subacromial CSI.  Tolerated well per note below. - Patient has been taking meloxicam 15 mg daily for the past 8 days without significant relief.  May discontinue meloxicam 15 mg daily and instead use meloxicam 15 mg daily as needed for breakthrough pain - Start Tylenol 500 to 1000 mg tablets 2-3 times a day for day-to-day pain relief - Continue HEP for shoulder and start physical therapy.  Referral sent - X-ray obtained in clinic.  My interpretation: [name] acute fracture or dislocation.  Mild degenerative changes with mild glenoid and acromial spurring - Patient's goals including increasing physical activity for weight loss and ability to care for her husband    PAIN:  Are you having pain? Yes: NPRS scale: current= 3/10; best= 3/10; worst= 10/10 Pain location: "like I have a charlie horse"  Pain description: Achy Aggravating factors: Working, reaching overhead, sleeping Relieving factors: meloxicam, rest  PRECAUTIONS: None  RED FLAGS: None   WEIGHT BEARING RESTRICTIONS: [name]  FALLS:  Has patient fallen in last 6 months? [name]  LIVING ENVIRONMENT: Lives with: lives with their spouse Lives in: House/apartment Stairs: Yes: Internal: 14 steps; can reach both; external- flat entry; back deck step approx 6 with Rails B Has following equipment at home: Elliptical Machine  OCCUPATION: Works at [employer] in [employer] full time.  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   NEXT MD VISIT:   OBJECTIVE:  Note: Objective measures were completed at Evaluation unless otherwise noted.  DIAGNOSTIC FINDINGS:  Per Dr. Jean Rosenthal note on 11/15/2023-X-ray obtained in clinic. My interpretation: [name] acute fracture or dislocation. Mild degenerative changes with mild glenoid and acromial spurring   PATIENT SURVEYS:  Quick Dash 88.6- A QuickDASH score is interpreted as a measure of upper limb disability, where a  higher score indicates a greater level of disability, ranging from 0 ([name] disability) to 100 (most severe disability); essentially, the higher the score, the more limitations a person experiences in their arm, shoulder, and hand function due to pain or other issues  COGNITION: Overall cognitive status: Within functional limits for tasks assessed     SENSATION: WFL  POSTURE: Forward head, rounded shoulder   UPPER EXTREMITY ROM:   Active ROM Right eval Left eval  Shoulder flexion 145 *painful   Shoulder extension  Shoulder abduction 130* painful   Shoulder adduction    Shoulder internal rotation WNL   Shoulder external rotation 80 *painful   Elbow flexion    Elbow extension    Wrist flexion    Wrist extension    Wrist ulnar deviation    Wrist radial deviation    Wrist pronation    Wrist supination    (Blank rows = not tested)  UPPER EXTREMITY MMT:  MMT Right eval Left eval  Shoulder flexion 3- 5  Shoulder extension    Shoulder abduction 3- 5  Shoulder adduction    Shoulder internal rotation 5 5  Shoulder external rotation 4 5  Middle trapezius    Lower trapezius    Elbow flexion 4 5  Elbow extension 5 5  Wrist flexion    Wrist extension    Wrist ulnar deviation    Wrist radial deviation    Wrist pronation    Wrist supination    Grip strength (lbs)    (Blank rows = not tested)  .  SHOULDER SPECIAL TESTS: Impingement tests: Neer impingement test: negative, Hawkins/Kennedy impingement test: positive , and Painful arc test: negative Instability tests: Sulcus sign: negative Rotator cuff assessment: Empty can test: positive  and Full can test: positive  Biceps assessment: Yergason's test: negative and Speed's test: positive   JOINT MOBILITY TESTING:  Hypmobile with Inferior GH joint glide [name] pain with PA/AP GH joint mobs  PALPATION:  Mild report of tenderness along anterior and lateral shoulder yet most tender along upper trap musculature with multiple  taut bands.                                                                                                                              TREATMENT DATE: 2/267/2025  Seated:   Seated shoulder Abduction to slide across mat table with 5 sec hold. X 8 reports lat stretch.  Seated shoulder flexion slide across table x 8, [name] pain  AROM bil shoulder flexion x 12  Shoulder ER YTB x 12  Attempted resisted scapular punch, reports UT pain in the R.  AROM sitting serratus punch x 10, difficulty with preventing shoulder elevation Low row YTB x 12   Supine:  Isometric shoulder extension  Serratus punch x 12  Pec stretch x 20 sec AROM in pain free range.   Manual supine:  Cervical upglides 30 sec per segment  Lateral mobs 20 sec C4-6 bil  UT stretch 2 x 25 sec bil  Cervical rotation stretch x 30 sec bil  STM for UT release in R UT and L cervical capitus. X 4 min  Seated Manual:  Infrapinatus STM with TP release 2 x 90 sec.  Cross frictional massage to teres major/minor and UT with 60sec TP release each.   Pt reports decreased stiffness upon completion of PT treatment.   PATIENT EDUCATION: Education details: Purpose of PT; Plan of care; Ed in anatomy of shoulder complex and  RC muscles; symptoms of impingement and pain management stragtegies Person educated: Patient Education method: Explanation Education comprehension: verbalized understanding    HOME EXERCISE PROGRAM: Access Code: [email or web address] URL: [email or web address] Date: 12/09/2023 Prepared by: Grier Rocher  Exercises - Standing Shoulder External and Internal Rotation AROM  - 1 x daily - 3 sets - 10 reps - Supine Shoulder Internal Rotation Stretch  - 1 x daily - 3 sets - 10 reps - Supine Shoulder External Rotation Stretch  - 1 x daily - 3 sets - 10 reps - Supine Isometric Shoulder Extension with Towel  - 1 x daily - 7 x weekly - 3 sets - 10 reps - 3 sec  hold - Supine Isometric Shoulder Adduction with Towel Roll   - 1 x daily - 7 x weekly - 3 sets - 10 reps - Standing Isometric Shoulder Internal Rotation at Doorway  - 1 x daily - 7 x weekly - 3 sets - 10 reps - Standing Isometric Shoulder External Rotation with Doorway  - 1 x daily - 7 x weekly - 3 sets - 10 reps  ASSESSMENT:  CLINICAL IMPRESSION: Patient reports to treatment for shoulder pain management, with good motivation. Pt states that bicep is feeling much better since CSI from Dr Jean Rosenthal yesterday, but states that UT is still  tight. Tolerated treatment well and reports [name] pain and reduced stiffness upon completion of PT treatment. Pt will benefit from PT services to address deficits in strength, mobility, and pain in order to return to full function at work and home with less shoulder pain.  OBJECTIVE IMPAIRMENTS: decreased ROM, decreased strength, hypomobility, impaired UE functional use, and pain.   ACTIVITY LIMITATIONS: carrying, lifting, dressing, reach over head, and caring for others  PARTICIPATION LIMITATIONS: cleaning, laundry, shopping, community activity, occupation, and yard work  PERSONAL FACTORS: 1-2 comorbidities: OA and HTn  are also affecting patient's functional outcome.   REHAB POTENTIAL: Good  CLINICAL DECISION MAKING: Stable/uncomplicated  EVALUATION COMPLEXITY: Low   GOALS: Goals reviewed with patient? Yes  SHORT TERM GOALS: Target date: 01/05/2024  Pt will be independent with HEP in order to improve strength and decrease pain in order to improve pain-free function at home and work. Baseline: EVAL= [name] formal HEP in place Goal status: INITIAL   LONG TERM GOALS: Target date: 02/16/2024  Pt will decrease quick DASH score by at least 8% in order to demonstrate clinically significant reduction in disability. Baseline: EVAL= 86% Goal status: INITIAL  2.  Pt will report decrease worst right shoulder pain as reported on NPRS by at least 3 points in order to demonstrate clinically significant reduction in  pain. Baseline: EVAL= worst= 10/10 Goal status: INITIAL  3.  Patient will demonstrated improved functional ROM achieving 160 deg of overhead reaching without report of pain for optimal functional use of Right shoulder at work and home.  Baseline: EVAL=  Goal status: INITIAL  4.  Patient will report only mild difficulty sleeping at night due to right shoulder pain for improved ability to obtain better rest.   Baseline: EVAL= Patient reports "severe" difficulty sleeping due to R shoulder pain Goal status: INITIAL   PLAN:  PT FREQUENCY: 1-2x/week  PT DURATION: 12 weeks  PLANNED INTERVENTIONS: 97164- PT Re-evaluation, 97110-Therapeutic exercises, 97530- Therapeutic activity, 97112- Neuromuscular re-education, 97535- Self Care, 62130- Manual therapy, [id number]- Electrical stimulation (manual), 97035- Ultrasound, Patient/Family education, Dry Needling, Joint mobilization, Joint manipulation, Spinal manipulation, Spinal mobilization, Cryotherapy, and Moist heat  PLAN FOR NEXT SESSION:   Manual  therapy for ROM/Pain relief, Therex/Theract for posture/RC strengthening- and add to HEP as appropriate.  TPDN for R shoulder pain   Golden Pop, PT 12/23/2023, 3:39 PM

## 2023-12-24 ENCOUNTER — Other Ambulatory Visit: Payer: Self-pay

## 2023-12-28 ENCOUNTER — Ambulatory Visit: Payer: Commercial Managed Care - PPO | Attending: Sports Medicine

## 2023-12-28 DIAGNOSIS — G8929 Other chronic pain: Secondary | ICD-10-CM

## 2023-12-28 DIAGNOSIS — M25511 Pain in right shoulder: Secondary | ICD-10-CM | POA: Insufficient documentation

## 2023-12-28 DIAGNOSIS — M25611 Stiffness of right shoulder, not elsewhere classified: Secondary | ICD-10-CM | POA: Diagnosis not present

## 2023-12-28 NOTE — Therapy (Signed)
 OUTPATIENT PHYSICAL THERAPY SHOULDER TREATMENT/PT DISCHARGE SUMMARY   Patient Name: Hailey [name] MRN: [id number] DOB:11/15/1960, 63 y.o., female Today's Date: 12/28/2023  END OF SESSION:  PT End of Session - 12/28/23 1454     Visit Number 6    Number of Visits 24    Date for PT Re-Evaluation 02/16/24    Progress Note Due on Visit 10    PT Start Time 1449    PT Stop Time 1531    PT Time Calculation (min) 42 min    Activity Tolerance No increased pain    Behavior During Therapy [facility] for tasks assessed/performed              Past Medical History:  Diagnosis Date   Allergy    Anemia    Arthritis    B12 deficiency    CARPAL TUNNEL SYNDROME, BILATERAL 08/04/2010   GERD 03/06/2008   HYPERTENSION 02/10/2007   Obesity    OBESITY NOS 02/10/2007   Sinusitis    Swelling    Vitamin D deficiency    Past Surgical History:  Procedure Laterality Date   CARPAL TUNNEL RELEASE     Both hands   COLONOSCOPY     2012 at [facility]   HEMORRHOID SURGERY     NASAL SINUS SURGERY     TOTAL KNEE ARTHROPLASTY Left 10/16/2019   Procedure: LEFT TOTAL KNEE ARTHROPLASTY;  Surgeon: Tarry Kos, MD;  Location: [facility] OR;  Service: Orthopedics;  Laterality: Left;   Patient Active Problem List   Diagnosis Date Noted   BMI 40.0-44.9, adult (HCC) 05/24/2023   Acute sinusitis 02/23/2023   Postnasal drip 02/23/2023   Osteopenia 11/24/2021   Thoracic radiculopathy 10/23/2021   Thoracic myofascial strain 10/23/2021   Viral syndrome 03/27/2021   Primary osteoarthritis of right knee 09/10/2020   At risk for activity intolerance 09/03/2020   At risk for complication associated with hypotension 07/18/2020   Vitamin D deficiency 05/30/2020   B12 nutritional deficiency 05/30/2020   Anemia 05/30/2020   Status post total left knee replacement 10/16/2019   Primary osteoarthritis of left knee 09/06/2019   Healthcare maintenance 02/24/2019   Chronic sinusitis 10/04/2018   CARPAL TUNNEL SYNDROME, BILATERAL  08/04/2010   Viral upper respiratory tract infection 10/16/2008   GERD 03/06/2008   Class 2 obesity due to excess calories without serious comorbidity with body mass index (BMI) of 38.0 to 38.9 in adult 02/10/2007   Essential hypertension 02/10/2007    PCP: Dr. Nadene Rubins  REFERRING PROVIDER: Dr. Richardean Sale  REFERRING DIAG: [phone] (ICD-10-CM) - Chronic right shoulder pain   THERAPY DIAG:  Impaired range of motion of right shoulder  Chronic right shoulder pain  Rationale for Evaluation and Treatment: Rehabilitation  ONSET DATE: November 2024  SUBJECTIVE:  SUBJECTIVE STATEMENT:  From today: Pt reports that she is feeling better and thinks she can continue on her own. States she just feels her arm is sore from performing her gardening over the weekend and her daily activities. States compliant with HEP and using KT tape to  stabilize the L shoulder.     From Eval:Patient reports pain started in November in Right shoulder and just had another injection last week with some improved pain. She reports still working full time but bothersome right shoulder - worse with all active arm movement.    Hand dominance: Left  PERTINENT HISTORY: Patient is a 63 year old female with referral for Right chronic shoulder pain by Orthopedics. Started back in November pain in right shoulder down into bicep region and some "charlie horse pain in my upper trap."  Per medical report from Dr. Chrisandra Netters on 2/17:  1. Chronic right shoulder pain -Chronic with exacerbation, subsequent visit - Recurrent right shoulder pain, most consistent with recurrent rotator cuff strain +/- subacromial bursitis +/- biceps tendinitis based on HPI and physical exam - Patient elected for repeat subacromial CSI.  Tolerated well  per note below. - Patient has been taking meloxicam 15 mg daily for the past 8 days without significant relief.  May discontinue meloxicam 15 mg daily and instead use meloxicam 15 mg daily as needed for breakthrough pain - Start Tylenol 500 to 1000 mg tablets 2-3 times a day for day-to-day pain relief - Continue HEP for shoulder and start physical therapy.  Referral sent - X-ray obtained in clinic.  My interpretation: No acute fracture or dislocation.  Mild degenerative changes with mild glenoid and acromial spurring - Patient's goals including increasing physical activity for weight loss and ability to care for her husband    PAIN:  Are you having pain? Yes: NPRS scale: current= 3/10; best= 3/10; worst= 10/10 Pain location: "like I have a charlie horse"  Pain description: Achy Aggravating factors: Working, reaching overhead, sleeping Relieving factors: meloxicam, rest  PRECAUTIONS: None  RED FLAGS: None   WEIGHT BEARING RESTRICTIONS: No  FALLS:  Has patient fallen in last 6 months? No  LIVING ENVIRONMENT: Lives with: lives with their spouse Lives in: House/apartment Stairs: Yes: Internal: 14 steps; can reach both; external- flat entry; back deck step approx 6 with Rails B Has following equipment at home: Elliptical Machine  OCCUPATION: Works at [employer] in [employer] full time.  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   NEXT MD VISIT:   OBJECTIVE:  Note: Objective measures were completed at Evaluation unless otherwise noted.  DIAGNOSTIC FINDINGS:  Per Dr. Jean Rosenthal note on 11/15/2023-X-ray obtained in clinic. My interpretation: No acute fracture or dislocation. Mild degenerative changes with mild glenoid and acromial spurring   PATIENT SURVEYS:  Quick Dash 88.6- A QuickDASH score is interpreted as a measure of upper limb disability, where a higher score indicates a greater  level of disability, ranging from 0 (no disability) to 100 (most severe disability); essentially, the higher the score, the more limitations a person experiences in their arm, shoulder, and hand function due to pain or other issues  COGNITION: Overall cognitive status: Within functional limits for tasks assessed     SENSATION: WFL  POSTURE: Forward head, rounded shoulder   UPPER EXTREMITY ROM:   Active ROM Right eval Left eval  Shoulder flexion 145 *painful   Shoulder extension    Shoulder abduction  130* painful   Shoulder adduction    Shoulder internal rotation WNL   Shoulder external rotation 80 *painful   Elbow flexion    Elbow extension    Wrist flexion    Wrist extension    Wrist ulnar deviation    Wrist radial deviation    Wrist pronation    Wrist supination    (Blank rows = not tested)  UPPER EXTREMITY MMT:  MMT Right eval Left eval  Shoulder flexion 3- 5  Shoulder extension    Shoulder abduction 3- 5  Shoulder adduction    Shoulder internal rotation 5 5  Shoulder external rotation 4 5  Middle trapezius    Lower trapezius    Elbow flexion 4 5  Elbow extension 5 5  Wrist flexion    Wrist extension    Wrist ulnar deviation    Wrist radial deviation    Wrist pronation    Wrist supination    Grip strength (lbs)    (Blank rows = not tested)  .  SHOULDER SPECIAL TESTS: Impingement tests: Neer impingement test: negative, Hawkins/Kennedy impingement test: positive , and Painful arc test: negative Instability tests: Sulcus sign: negative Rotator cuff assessment: Empty can test: positive  and Full can test: positive  Biceps assessment: Yergason's test: negative and Speed's test: positive   JOINT MOBILITY TESTING:  Hypmobile with Inferior GH joint glide No pain with PA/AP GH joint mobs  PALPATION:  Mild report of tenderness along anterior and lateral shoulder yet most tender along upper trap musculature with multiple taut bands.                                                                                                                               TREATMENT DATE: 12/28/2023  Physical therapy treatment session today consisted of completing assessment of goals and administration of testing as demonstrated and documented in flow sheet, treatment, and goals section of this note. Addition treatments may be found below.   Self care/Home management:  Review of current HEP including AROM/stretching/isometrics and addition of the following: - Standing Shoulder ER- GTB and towel rolled up under arm -2 sets of 10 reps -Standing Shoulder IR- GTB and towel rolled up under arm- 2 sets of 10 reps - Standing Shoulder ABD-GTB- x 15 -Standing Shoulder Flex- GTB x 15 -Standing Shoulder EXT- GTB x 15 VC for technique- Reviewed as well using [employer] videos and issued new handout.   PATIENT EDUCATION: Education details: Purpose of PT; Plan of care; Ed in anatomy of shoulder complex and RC muscles; symptoms of impingement and pain management stragtegies Person educated: Patient Education method: Explanation Education comprehension: verbalized understanding    HOME EXERCISE PROGRAM: Access Code: [email or web address] URL: [email or web address] Date: 12/09/2023 Prepared by: Grier Rocher  Exercises - Standing Shoulder External and Internal Rotation AROM  - 1 x daily - 3 sets - 10 reps - Supine Shoulder Internal Rotation Stretch  - 1 x daily - 3  sets - 10 reps - Supine Shoulder External Rotation Stretch  - 1 x daily - 3 sets - 10 reps - Supine Isometric Shoulder Extension with Towel  - 1 x daily - 7 x weekly - 3 sets - 10 reps - 3 sec  hold - Supine Isometric Shoulder Adduction with Towel Roll  - 1 x daily - 7 x weekly - 3 sets - 10 reps - Standing Isometric Shoulder Internal Rotation at Doorway  - 1 x daily - 7 x weekly - 3 sets - 10 reps - Standing Isometric Shoulder External Rotation with Doorway  - 1 x daily - 7 x weekly - 3 sets - 10  reps  ASSESSMENT:  CLINICAL IMPRESSION: Patient presents with much improved overall report of pain- only at 2/10 at worst and none currently. She was able to progress to more strengthening activities today and no issues with pain or function. She requested today to potentially last day and agreeable to reassessment of all goals. At this time she has met her goals - improved report of pain; independent with HEP, improved Right Shoulder ROM, Improved subjective function as seen by decreased quick dash scale. She is appropriate for discharge with all goals met at this time.   OBJECTIVE IMPAIRMENTS: decreased ROM, decreased strength, hypomobility, impaired UE functional use, and pain.   ACTIVITY LIMITATIONS: carrying, lifting, dressing, reach over head, and caring for others  PARTICIPATION LIMITATIONS: cleaning, laundry, shopping, community activity, occupation, and yard work  PERSONAL FACTORS: 1-2 comorbidities: OA and HTn  are also affecting patient's functional outcome.   REHAB POTENTIAL: Good  CLINICAL DECISION MAKING: Stable/uncomplicated  EVALUATION COMPLEXITY: Low   GOALS: Goals reviewed with patient? Yes  SHORT TERM GOALS: Target date: 01/05/2024  Pt will be independent with HEP in order to improve strength and decrease pain in order to improve pain-free function at home and work. Baseline: EVAL= No formal HEP in place; 12/28/2023 - Patient verbalized and demo independence with current HEP and able to progress HEP today Goal status: MET   LONG TERM GOALS: Target date: 02/16/2024  Pt will decrease quick DASH score by at least 8% in order to demonstrate clinically significant reduction in disability. Baseline: EVAL= 86%; 12/28/2023= 16% Goal status: MET  2.  Pt will report decrease worst right shoulder pain as reported on NPRS by at least 3 points in order to demonstrate clinically significant reduction in pain. Baseline: EVAL= worst= 10/10; 12/28/2023= 2/10 Goal status: MET  3.   Patient will demonstrated improved functional ROM achieving 160 deg of overhead reaching without report of pain for optimal functional use of Right shoulder at work and home.  Baseline: EVAL= 145; 12/28/2023= 165 deg with no pain Goal status: MET  4.  Patient will report only mild difficulty sleeping at night due to right shoulder pain for improved ability to obtain better rest.   Baseline: EVAL= Patient reports "severe" difficulty sleeping due to R shoulder pain; 12/28/2023- Patient reports sleeping better - still not sleeping on right side but at least able to sleep through the night Goal status: MET   PLAN:  PT FREQUENCY: 1-2x/week  PT DURATION: 12 weeks  PLANNED INTERVENTIONS: 97164- PT Re-evaluation, 97110-Therapeutic exercises, 97530- Therapeutic activity, 97112- Neuromuscular re-education, 97535- Self Care, [address]- Manual therapy, [id number]- Electrical stimulation (manual), 97035- Ultrasound, Patient/Family education, Dry Needling, Joint mobilization, Joint manipulation, Spinal manipulation, Spinal mobilization, Cryotherapy, and Moist heat  PLAN FOR NEXT SESSION:   Discharge today with all goals met   Merry Lofty  Ziair Penson, PT 12/28/2023, 4:00 PM

## 2023-12-30 ENCOUNTER — Ambulatory Visit: Payer: Commercial Managed Care - PPO | Admitting: Physical Therapy

## 2024-01-04 ENCOUNTER — Ambulatory Visit: Payer: Commercial Managed Care - PPO | Admitting: Physical Therapy

## 2024-01-11 ENCOUNTER — Ambulatory Visit: Payer: Commercial Managed Care - PPO | Admitting: Physical Therapy

## 2024-01-13 ENCOUNTER — Encounter: Payer: Commercial Managed Care - PPO | Admitting: Physical Therapy

## 2024-01-18 ENCOUNTER — Ambulatory Visit: Payer: Commercial Managed Care - PPO

## 2024-01-25 ENCOUNTER — Encounter: Payer: Commercial Managed Care - PPO | Admitting: Physical Therapy

## 2024-02-02 ENCOUNTER — Encounter: Payer: Commercial Managed Care - PPO | Admitting: Physical Therapy

## 2024-02-14 ENCOUNTER — Other Ambulatory Visit (HOSPITAL_COMMUNITY): Payer: Self-pay

## 2024-02-14 MED ORDER — AMOXICILLIN 500 MG PO CAPS
500.0000 mg | ORAL_CAPSULE | Freq: Every day | ORAL | 1 refills | Status: DC
  Filled 2024-02-14: qty 8, 8d supply, fill #0

## 2024-02-29 ENCOUNTER — Other Ambulatory Visit: Payer: Self-pay | Admitting: Sports Medicine

## 2024-03-01 ENCOUNTER — Other Ambulatory Visit: Payer: Self-pay

## 2024-03-06 ENCOUNTER — Other Ambulatory Visit: Payer: Self-pay

## 2024-03-06 ENCOUNTER — Ambulatory Visit: Admitting: Sports Medicine

## 2024-03-06 VITALS — BP 130/60 | HR 94 | Ht 64.0 in | Wt 247.6 lb

## 2024-03-06 DIAGNOSIS — M25511 Pain in right shoulder: Secondary | ICD-10-CM | POA: Diagnosis not present

## 2024-03-06 DIAGNOSIS — G8929 Other chronic pain: Secondary | ICD-10-CM | POA: Diagnosis not present

## 2024-03-06 DIAGNOSIS — M7521 Bicipital tendinitis, right shoulder: Secondary | ICD-10-CM | POA: Diagnosis not present

## 2024-03-06 NOTE — Progress Notes (Signed)
 Ben Aleysia Oltmann D.[facility] [address] [place] [address] Phone: [phone]   Assessment and Plan:     1. Chronic right shoulder pain 2. Biceps tendinitis of right upper extremity  -Chronic with exacerbation, subsequent visit - Overall improvement in right shoulder and biceps tendon pain after subacromial CSI on 11/15/2023 and biceps tendon CSI on 12/13/2023, completing meloxicam  course, and starting HEP and physical therapy - While patient has had overall improvement, she has had return of biceps tendon and biceps muscular pain over the past 2 to 3 weeks.  Discussed that we would ideally wait at least 3 months in between repeat CSI, so could consider repeat biceps tendon CSI at follow-up visit - Start Tylenol  500 to 1000 mg tablets 2-3 times a day for day-to-day pain relief - Use meloxicam  15 mg daily as needed for pain.  Recommend limiting chronic NSAIDs to 1-2 doses per week to prevent long-term side effects. - Continue HEP and physical therapy for shoulder  Pertinent previous records reviewed include none  Follow Up: 3 to 4 weeks for reevaluation.  Could consider repeat biceps tendon CSI   Subjective:   I, Leone Ralphs am a scribe for  Dr. Cleora Daft.    Chief Complaint: recurring pain in upper right arm biceps   HPI:    12/05/2021 Patient is a 63 year old female complaining of thoracic radiculopathy. Patient states issue of mid back pain on the right side that radiates around her chest just underneath her breast happened in January .  no numbness or tingling She feels muscle spasms.  Lower doses of meloxicam  and methocarbamol  does seem to be working, was moving boxes when she was moving .  She is left hand dominant.  She does do some over shoulder lifting through her work.  Review of the plain films arthritis decreased disc space.  Radiologist noted osteopenia   08/10/22 Patient states that she is having the same pain that she had when  she came in the first time , she is in flare and wants , muscle and relaxer and tylenol  have helped    10/02/2022 Patient states she doing pretty good still has the snag in her back , she can tell a difference when she take meloxicam  still feel the knot in her back , feels like a crook in her neck to her shoulder    12/15/2022 Patient states back and shoulder are in flare , has been using old muscle relaxer's and those have helped but has run out   06/14/2023 Patient states upper right side back flare that radiates to under her breast area   06/21/2023 Patient states that cocktail and meloxicam , tylenol  and muscle relaxer helped. Tightness has eased up some    07/05/2023 Patient states she is feeling better    08/17/2023 Patient states same pain flare that is now radiating to her shoulder .    11/15/2023 Patient states shoulder flare that stared last weekend. pain goes down her arm to the bicep . Decreased ROM    12/13/2023 Patient states it is ok. Doing PT and they do dry needling at the top of the shoulder. Compression sleeve helps. Had a little set back when her husband fell and she tried to catch him. It felt like an electric shock running up her arm. Feels like it is her bicep instead of her shoulder. Did really well for a long time when she got the steroid shots in November. The most recent  one took about two weeks to set in. Bought K tape but hasn't applied it yet.   03/06/24 Patient states did well with rehab. The steroid shot that was given in February has worn off. It is giving her some serious issues right now. Taking meloxicam  as needed and the muscle relaxer really helps.     Current Outpatient Medications:    amoxicillin  (AMOXIL ) 500 MG capsule, Take 1 capsule (500 mg total) by mouth one hour before dental appointment, Disp: 8 capsule, Rfl: 1   benazepril  (LOTENSIN ) 20 MG tablet, Take 1 tablet (20 mg total) by mouth daily., Disp: 90 tablet, Rfl: 1   benzonatate  (TESSALON ) 200  MG capsule, Take 1 capsule (200 mg total) by mouth 2 (two) times daily as needed for cough., Disp: 20 capsule, Rfl: 0   calcium carbonate (OSCAL) 1500 (600 Ca) MG TABS tablet, Take by mouth 2 (two) times daily with a meal., Disp: , Rfl:    cetirizine-pseudoephedrine  (ZYRTEC-D) 5-120 MG tablet, Take 1 tablet by mouth daily as needed for allergies., Disp: , Rfl:    cholecalciferol (VITAMIN D3) 25 MCG (1000 UNIT) tablet, Take 1,000 Units by mouth daily., Disp: , Rfl:    FERROUS SULFATE PO, Take 1 tablet by mouth daily. , Disp: , Rfl:    ibuprofen  (ADVIL ) 200 MG tablet, Take 400 mg by mouth every 6 (six) hours as needed., Disp: , Rfl:    meloxicam  (MOBIC ) 15 MG tablet, Take 1 tablet (15 mg total) by mouth daily., Disp: 30 tablet, Rfl: 0   methocarbamol  (ROBAXIN ) 500 MG tablet, Take 1 tablet (500 mg total) by mouth every 8 (eight) hours as needed for muscle spasms., Disp: 90 tablet, Rfl: 0   Multiple Vitamin (MULTIVITAMIN WITH MINERALS) TABS tablet, Take 1 tablet by mouth daily., Disp: , Rfl:    Objective:     Vitals:   03/06/24 1400  BP: 130/60  Pulse: 94  SpO2: 96%  Weight: 247 lb 9.6 oz (112.3 kg)  Height: 5\' 4"  (1.626 m)      Body mass index is 42.5 kg/m.    Physical Exam:    Gen: Appears well, nad, nontoxic and pleasant Neuro:sensation intact, strength is 5/5 with df/pf/inv/ev, muscle tone wnl Skin: no suspicious lesion or defmority Psych: A&O, appropriate mood and affect   Right shoulder:  No deformity, swelling or muscle wasting No scapular winging FF 180, abd 90, int 30, ext 70 TTP biceps groove, AC, coracoid, humerus, deltoid NTTP over the [place], clavicle, trapezius, cervical spine Positive hawkins, empty can, obriens, crossarm, subscap liftoff, speeds neg neer,  Pain with drop arm test Neg ant drawer, sulcus sign, apprehension Negative Spurling's test bilat FROM of neck     Electronically signed by:  Marshall Skeeter D.[facility] 5:00 PM 03/06/24

## 2024-03-06 NOTE — Patient Instructions (Signed)
 Tylenol  [id number] mg 2-3 times a day for pain relief  - Use meloxicam  15 mg daily as needed for pain.  Recommend limiting chronic NSAIDs to 1-2 doses per week to prevent long-term side effects. May continue Voltaren  gel along with CBD 3-4 week follow up

## 2024-03-06 NOTE — Progress Notes (Deleted)
 error

## 2024-03-20 ENCOUNTER — Other Ambulatory Visit: Payer: Self-pay | Admitting: Family Medicine

## 2024-03-20 ENCOUNTER — Other Ambulatory Visit (HOSPITAL_COMMUNITY): Payer: Self-pay

## 2024-03-20 DIAGNOSIS — I10 Essential (primary) hypertension: Secondary | ICD-10-CM

## 2024-03-20 MED ORDER — BENAZEPRIL HCL 20 MG PO TABS
20.0000 mg | ORAL_TABLET | Freq: Every day | ORAL | 1 refills | Status: DC
  Filled 2024-03-20: qty 90, 90d supply, fill #0
  Filled 2024-06-19 (×2): qty 90, 90d supply, fill #1

## 2024-03-30 ENCOUNTER — Ambulatory Visit: Admitting: Sports Medicine

## 2024-03-30 ENCOUNTER — Other Ambulatory Visit (HOSPITAL_COMMUNITY): Payer: Self-pay

## 2024-03-30 VITALS — BP 130/70 | HR 96 | Ht 64.0 in | Wt 254.0 lb

## 2024-03-30 DIAGNOSIS — G8929 Other chronic pain: Secondary | ICD-10-CM | POA: Diagnosis not present

## 2024-03-30 DIAGNOSIS — M7521 Bicipital tendinitis, right shoulder: Secondary | ICD-10-CM

## 2024-03-30 DIAGNOSIS — M25511 Pain in right shoulder: Secondary | ICD-10-CM | POA: Diagnosis not present

## 2024-03-30 MED ORDER — METHOCARBAMOL 500 MG PO TABS
500.0000 mg | ORAL_TABLET | Freq: Three times a day (TID) | ORAL | 0 refills | Status: AC | PRN
Start: 2024-03-30 — End: ?
  Filled 2024-03-30: qty 30, 10d supply, fill #0

## 2024-03-30 MED ORDER — MELOXICAM 15 MG PO TABS
15.0000 mg | ORAL_TABLET | ORAL | 0 refills | Status: DC | PRN
  Filled 2024-03-30: qty 30, 30d supply, fill #0

## 2024-03-30 NOTE — Progress Notes (Signed)
 Hailey [name] D.CLEMENTEEN [name] [address] [place] [address] Phone: [phone]   Assessment and Plan:     1. Chronic right shoulder pain 2. Biceps tendinitis of right upper extremity -Chronic with exacerbation, subsequent visit - Continued and worsening right shoulder pain extending into biceps tendon.  Still most consistent with biceps tendinitis, rotator cuff pathology - Due to failure to improve despite >6 weeks of conservative therapy, relatively unremarkable x-ray imaging, pain affecting day-to-day activities, pain at times >6/10, recommend further evaluation with right shoulder MRI.  Order placed today - Use meloxicam  15 mg daily as needed for pain.  Recommend limiting chronic NSAIDs to 1-2 doses per week to prevent long-term side effects. - Continue Robaxin  500 mg daily as needed.  Refill provided   Pertinent previous records reviewed include none  Follow Up: 1 week after MRI to review results and discuss treatment plan.  Could consider CSI based on report and imaging   Subjective:    Chief Complaint: right bicep  HPI:  12/05/2021 Patient is a 63 year old female complaining of thoracic radiculopathy. Patient states issue of mid back pain on the right side that radiates around her chest just underneath her breast happened in January .  no numbness or tingling She feels muscle spasms.  Lower doses of meloxicam  and methocarbamol  does seem to be working, was moving boxes when she was moving .  She is left hand dominant.  She does do some over shoulder lifting through her work.  Review of the plain films arthritis decreased disc space.  Radiologist noted osteopenia   08/10/22 Patient states that she is having the same pain that she had when she came in the first time , she is in flare and wants , muscle and relaxer and tylenol  have helped    10/02/2022 Patient states she doing pretty good still has the snag in her back , she can tell a  difference when she take meloxicam  still feel the knot in her back , feels like a crook in her neck to her shoulder    12/15/2022 Patient states back and shoulder are in flare , has been using old muscle relaxer's and those have helped but has run out   06/14/2023 Patient states upper right side back flare that radiates to under her breast area   06/21/2023 Patient states that cocktail and meloxicam , tylenol  and muscle relaxer helped. Tightness has eased up some    07/05/2023 Patient states she is feeling better    08/17/2023 Patient states same pain flare that is now radiating to her shoulder .    11/15/2023 Patient states shoulder flare that stared last weekend. pain goes down her arm to the bicep . Decreased ROM    12/13/2023 Patient states it is ok. Doing PT and they do dry needling at the top of the shoulder. Compression sleeve helps. Had a little set back when her husband fell and she tried to catch him. It felt like an electric shock running up her arm. Feels like it is her bicep instead of her shoulder. Did really well for a long time when she got the steroid shots in November. The most recent one took about two weeks to set in. Bought K tape but hasn't applied it yet.   03/06/24 Patient states did well with rehab. The steroid shot that was given in February has worn off. It is giving her some serious issues right now. Taking meloxicam  as needed and  the muscle relaxer really helps.    03/30/24 Patient states right shoulder. Left knee has been swollen lately in the morning but it goes down at night. Doesn't have anymore muscle relaxer or meloxicam .   Relevant Historical Information:  Hypertension, GERD  Additional pertinent review of systems negative.   Current Outpatient Medications:    amoxicillin  (AMOXIL ) 500 MG capsule, Take 1 capsule (500 mg total) by mouth one hour before dental appointment, Disp: 8 capsule, Rfl: 1   benazepril  (LOTENSIN ) 20 MG tablet, Take 1 tablet (20 mg  total) by mouth daily., Disp: 90 tablet, Rfl: 1   benzonatate  (TESSALON ) 200 MG capsule, Take 1 capsule (200 mg total) by mouth 2 (two) times daily as needed for cough., Disp: 20 capsule, Rfl: 0   calcium carbonate (OSCAL) 1500 (600 Ca) MG TABS tablet, Take by mouth 2 (two) times daily with a meal., Disp: , Rfl:    cetirizine-pseudoephedrine  (ZYRTEC-D) 5-120 MG tablet, Take 1 tablet by mouth daily as needed for allergies., Disp: , Rfl:    cholecalciferol (VITAMIN D3) 25 MCG (1000 UNIT) tablet, Take 1,000 Units by mouth daily., Disp: , Rfl:    FERROUS SULFATE PO, Take 1 tablet by mouth daily. , Disp: , Rfl:    meloxicam  (MOBIC ) 15 MG tablet, Take 1 tablet (15 mg total) by mouth as needed for pain., Disp: 30 tablet, Rfl: 0   methocarbamol  (ROBAXIN ) 500 MG tablet, Take 1 tablet (500 mg total) by mouth every 8 (eight) hours as needed for muscle spasms., Disp: 30 tablet, Rfl: 0   Multiple Vitamin (MULTIVITAMIN WITH MINERALS) TABS tablet, Take 1 tablet by mouth daily., Disp: , Rfl:    Objective:     Vitals:   03/30/24 1545  BP: 130/70  Pulse: 96  SpO2: 97%  Weight: 254 lb (115.2 kg)  Height: 5' 4 (1.626 m)      Body mass index is 43.6 kg/m.    Physical Exam:    Gen: Appears well, nad, nontoxic and pleasant Neuro:sensation intact, strength is 5/5 with df/pf/inv/ev, muscle tone wnl Skin: no suspicious lesion or defmority Psych: A&O, appropriate mood and affect   Right shoulder:  No deformity, swelling or muscle wasting No scapular winging FF 180, abd 90, int 30, ext 70 TTP biceps groove, AC, coracoid, humerus, deltoid NTTP over the [place], clavicle, trapezius, cervical spine Positive hawkins, empty can, obriens, crossarm, subscap liftoff, speeds neg neer,  Pain with drop arm test Neg ant drawer, sulcus sign, apprehension Negative Spurling's test bilat FROM of neck     Electronically signed by:  Odis Mace D.CLEMENTEEN [name] 4:09 PM 03/30/24

## 2024-03-30 NOTE — Patient Instructions (Signed)
 MRI of right shoulder at [facility]. Follow up 1 week after MRI.  Medication refilled.

## 2024-04-10 ENCOUNTER — Telehealth: Payer: Self-pay | Admitting: Sports Medicine

## 2024-04-10 NOTE — Telephone Encounter (Signed)
 Patient called and asked if there is an imaging center she can go to in [place] instead of [place]. Please advise.

## 2024-04-11 NOTE — Telephone Encounter (Signed)
 Left voice message for patient to give the office a call back. Advised her that Bonni had been trying to reach her to schedule her MRI (information in [place] message from [employer]). Asked patient to call back if she wanted the location to be switched to [facility] instead.   Notes attached to this order states that no prior authorization is needed.

## 2024-04-11 NOTE — Telephone Encounter (Signed)
 Looks like no precert was required when verified previously through [employer] on 04/03/24.  I have went ahead and changed the location to [facility].   Can you confirm that no precert is still no longer required?

## 2024-04-12 NOTE — Telephone Encounter (Signed)
 No pre cert is required for patient no matter what location she wants to go to.  imaging is fine

## 2024-04-12 NOTE — Telephone Encounter (Signed)
 Location has been changed in the referral for the MRI to [facility], per the patient's request.  Ashley, can you update the order to [facility]?

## 2024-04-12 NOTE — Addendum Note (Signed)
 Addended by: GEROME CROME R on: 04/12/2024 11:06 AM   Modules accepted: Orders

## 2024-04-12 NOTE — Telephone Encounter (Signed)
 Location changed with in the order

## 2024-04-18 ENCOUNTER — Other Ambulatory Visit: Payer: Self-pay | Admitting: Sports Medicine

## 2024-04-18 DIAGNOSIS — G8929 Other chronic pain: Secondary | ICD-10-CM

## 2024-05-01 ENCOUNTER — Ambulatory Visit
Admission: RE | Admit: 2024-05-01 | Discharge: 2024-05-01 | Disposition: A | Discharge status: Home / Self Care | Source: Ambulatory Visit | Attending: Sports Medicine

## 2024-05-01 ENCOUNTER — Ambulatory Visit
Admission: RE | Admit: 2024-05-01 | Discharge: 2024-05-01 | Disposition: A | Discharge status: Home / Self Care | Source: Ambulatory Visit | Attending: Sports Medicine | Admitting: Sports Medicine

## 2024-05-01 DIAGNOSIS — G8929 Other chronic pain: Secondary | ICD-10-CM

## 2024-05-01 DIAGNOSIS — M75121 Complete rotator cuff tear or rupture of right shoulder, not specified as traumatic: Secondary | ICD-10-CM | POA: Diagnosis not present

## 2024-05-01 DIAGNOSIS — M7521 Bicipital tendinitis, right shoulder: Secondary | ICD-10-CM

## 2024-05-11 NOTE — Progress Notes (Signed)
 Ben Shakisha Abend D.CLEMENTEEN [facility] [address] [place] [address] Phone: [phone]   Assessment and Plan:     1. Chronic right shoulder pain 2. Biceps tendinitis of right upper extremity 3. Nontraumatic complete tear of right rotator cuff -Chronic with exacerbation, subsequent visit - Continued right shoulder pain consistent with MRI findings of complete tears of supraspinatus and subscapularis, tendinosis of biceps tendon and infraspinatus tendon - Patient has failed conservative therapy including subacromial CSI on 11/15/2023, biceps tendon CSI on 12/13/2023, meloxicam course, HEP, physical therapy.  Recommend further evaluation with orthopedic surgery.  Patient requesting CSI today, however we elected to delay CSI in case patient needs to proceed with surgery. 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  I recommend establishing care with orthopedic surgery to discuss surgical options and future planning - Use Tylenol 500 to 1000 mg tablets 2-3 times a day for day-to-day pain relief - Use meloxicam 15 mg daily as needed for pain.  Recommend limiting chronic NSAIDs to 1-2 doses per week to prevent long-term side effects.    Pertinent previous records reviewed include    Right shoulder MRI 05/01/24  Follow Up: As needed.  Could follow-up for CSI if patient elects to delay surgery   Subjective:   I, [name], am serving as a Neurosurgeon for Doctor Morene Mace  Chief Complaint: right bicep   HPI:  12/05/2021 Patient is a 63 year old female complaining of thoracic radiculopathy. Patient states issue of mid back pain on the right side that radiates around her chest just underneath her breast happened in January .  no numbness or tingling She feels muscle spasms.  Lower doses of meloxicam and methocarbamol does seem to be working, was moving boxes when she was moving .  She is left  hand dominant.  She does do some over shoulder lifting through her work.  Review of the plain films arthritis decreased disc space.  Radiologist noted osteopenia   08/10/22 Patient states that she is having the same pain that she had when she came in the first time , she is in flare and wants , muscle and relaxer and tylenol have helped    10/02/2022 Patient states she doing pretty good still has the snag in her back , she can tell a difference when she take meloxicam still feel the knot in her back , feels like a crook in her neck to her shoulder    12/15/2022 Patient states back and shoulder are in flare , has been using old muscle relaxer's and those have helped but has run out   06/14/2023 Patient states upper right side back flare that radiates to under her breast area   06/21/2023 Patient states that cocktail and meloxicam, tylenol and muscle relaxer helped. Tightness has eased up some    07/05/2023 Patient states she is feeling better    08/17/2023 Patient states same pain flare that is now radiating to her shoulder .    11/15/2023 Patient states shoulder flare that stared last weekend. pain goes down her arm to the bicep . Decreased ROM    12/13/2023 Patient states it is ok. Doing PT and they do dry needling at the top of the shoulder. Compression sleeve helps. Had a little set back when her husband fell and she tried to catch him. It felt like an electric shock running up her arm.  Feels like it is her bicep instead of her shoulder. Did really well for a long time when she got the steroid shots in November. The most recent one took about two weeks to set in. Bought K tape but hasn't applied it yet.   03/06/24 Patient states did well with rehab. The steroid shot that was given in February has worn off. It is giving her some serious issues right now. Taking meloxicam as needed and the muscle relaxer really helps.     03/30/24 Patient states right shoulder. Left knee has been swollen  lately in the morning but it goes down at night. Doesn't have anymore muscle relaxer or meloxicam.   05/12/2024 Patient states she is the same . Same aches and pains   Relevant Historical Information:  Hypertension, GERD  Additional pertinent review of systems negative.   Current Outpatient Medications:    amoxicillin (AMOXIL) 500 MG capsule, Take 1 capsule (500 mg total) by mouth one hour before dental appointment, Disp: 8 capsule, Rfl: 1   benazepril (LOTENSIN) 20 MG tablet, Take 1 tablet (20 mg total) by mouth daily., Disp: 90 tablet, Rfl: 1   benzonatate (TESSALON) 200 MG capsule, Take 1 capsule (200 mg total) by mouth 2 (two) times daily as needed for cough., Disp: 20 capsule, Rfl: 0   calcium carbonate (OSCAL) 1500 (600 Ca) MG TABS tablet, Take by mouth 2 (two) times daily with a meal., Disp: , Rfl:    cetirizine-pseudoephedrine (ZYRTEC-D) 5-120 MG tablet, Take 1 tablet by mouth daily as needed for allergies., Disp: , Rfl:    cholecalciferol (VITAMIN D3) 25 MCG (1000 UNIT) tablet, Take 1,000 Units by mouth daily., Disp: , Rfl:    FERROUS SULFATE PO, Take 1 tablet by mouth daily. , Disp: , Rfl:    meloxicam (MOBIC) 15 MG tablet, Take 1 tablet (15 mg total) by mouth as needed for pain., Disp: 30 tablet, Rfl: 0   methocarbamol (ROBAXIN) 500 MG tablet, Take 1 tablet (500 mg total) by mouth every 8 (eight) hours as needed for muscle spasms., Disp: 30 tablet, Rfl: 0   Multiple Vitamin (MULTIVITAMIN WITH MINERALS) TABS tablet, Take 1 tablet by mouth daily., Disp: , Rfl:    Objective:     Vitals:   05/12/24 1258  Pulse: 79  SpO2: 97%  Weight: 254 lb (115.2 kg)  Height: 5' 4 (1.626 m)      Body mass index is 43.6 kg/m.    Physical Exam:    Gen: Appears well, nad, nontoxic and pleasant Neuro:sensation intact, strength is 5/5 with df/pf/inv/ev, muscle tone wnl Skin: no suspicious lesion or defmority Psych: A&O, appropriate mood and affect   Right shoulder:  No deformity,  swelling or muscle wasting No scapular winging FF 120, abd 90, int 30, ext 70 TTP biceps groove, AC, coracoid, humerus, deltoid NTTP over the [place], clavicle, trapezius, cervical spine Positive hawkins, empty can, obriens, crossarm, subscap liftoff, speeds neg neer,  Pain with drop arm test Neg ant drawer, sulcus sign, apprehension Negative Spurling's test bilat FROM of neck     Electronically signed by:  Odis Mace D.CLEMENTEEN [facility] 1:16 PM 05/12/24

## 2024-05-12 ENCOUNTER — Ambulatory Visit: Admitting: Sports Medicine

## 2024-05-12 VITALS — HR 79 | Ht 64.0 in | Wt 254.0 lb

## 2024-05-12 DIAGNOSIS — M7521 Bicipital tendinitis, right shoulder: Secondary | ICD-10-CM | POA: Diagnosis not present

## 2024-05-12 DIAGNOSIS — G8929 Other chronic pain: Secondary | ICD-10-CM | POA: Diagnosis not present

## 2024-05-12 DIAGNOSIS — M75121 Complete rotator cuff tear or rupture of right shoulder, not specified as traumatic: Secondary | ICD-10-CM

## 2024-05-12 DIAGNOSIS — M25511 Pain in right shoulder: Secondary | ICD-10-CM

## 2024-05-12 NOTE — Patient Instructions (Signed)
 Ortho referral  As needed follow up once you establish care for possible injection

## 2024-05-31 ENCOUNTER — Other Ambulatory Visit (HOSPITAL_BASED_OUTPATIENT_CLINIC_OR_DEPARTMENT_OTHER): Payer: Self-pay

## 2024-05-31 ENCOUNTER — Ambulatory Visit (HOSPITAL_BASED_OUTPATIENT_CLINIC_OR_DEPARTMENT_OTHER): Payer: Self-pay | Admitting: Orthopaedic Surgery

## 2024-05-31 ENCOUNTER — Ambulatory Visit (INDEPENDENT_AMBULATORY_CARE_PROVIDER_SITE_OTHER): Admitting: Orthopaedic Surgery

## 2024-05-31 ENCOUNTER — Encounter (HOSPITAL_BASED_OUTPATIENT_CLINIC_OR_DEPARTMENT_OTHER): Payer: Self-pay

## 2024-05-31 DIAGNOSIS — S46011A Strain of muscle(s) and tendon(s) of the rotator cuff of right shoulder, initial encounter: Secondary | ICD-10-CM

## 2024-05-31 MED ORDER — ACETAMINOPHEN 500 MG PO TABS
500.0000 mg | ORAL_TABLET | Freq: Three times a day (TID) | ORAL | 0 refills | Status: AC
Start: 2024-05-31 — End: 2024-06-10
  Filled 2024-05-31: qty 30, 10d supply, fill #0

## 2024-05-31 MED ORDER — ASPIRIN 325 MG PO TBEC
325.0000 mg | DELAYED_RELEASE_TABLET | Freq: Every day | ORAL | 0 refills | Status: AC
  Filled 2024-05-31: qty 14, 14d supply, fill #0

## 2024-05-31 MED ORDER — FLUZONE 0.5 ML IM SUSY
0.5000 mL | PREFILLED_SYRINGE | Freq: Once | INTRAMUSCULAR | 0 refills | Status: AC
Start: 2024-05-31 — End: 2024-06-01
  Filled 2024-05-31 (×2): qty 0.5, 1d supply, fill #0

## 2024-05-31 MED ORDER — OXYCODONE HCL 5 MG PO TABS
5.0000 mg | ORAL_TABLET | ORAL | 0 refills | Status: DC | PRN
  Filled 2024-05-31: qty 10, 2d supply, fill #0

## 2024-05-31 NOTE — Progress Notes (Signed)
 Chief Complaint: Right shoulder pain     History of Present Illness:    Hailey [name] is a 63 y.o. female right dominant female presents with right shoulder pain which has been ongoing for several years.  She does do quite significant repetitive work and enjoys gardening.  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  She has had physical therapy supervised with Dr. Leonce.  She has had an injection which did give her good temporary relief.    PMH/PSH/Family History/Social History/Meds/Allergies:    Past Medical History:  Diagnosis Date   Allergy    Anemia    Arthritis    B12 deficiency    CARPAL TUNNEL SYNDROME, BILATERAL 08/04/2010   GERD 03/06/2008   HYPERTENSION 02/10/2007   Obesity    OBESITY NOS 02/10/2007   Sinusitis    Swelling    Vitamin D  deficiency    Past Surgical History:  Procedure Laterality Date   CARPAL TUNNEL RELEASE     Both hands   COLONOSCOPY     2012 at [facility]   HEMORRHOID SURGERY     NASAL SINUS SURGERY     TOTAL KNEE ARTHROPLASTY Left 10/16/2019   Procedure: LEFT TOTAL KNEE ARTHROPLASTY;  Surgeon: Jerri Kay HERO, MD;  Location: [facility] OR;  Service: Orthopedics;  Laterality: Left;   Social History   Socioeconomic History   Marital status: Married    Spouse name: Not on file   Number of children: Not on file   Years of education: Not on file   Highest education level: Associate degree: occupational, Scientist, product/process development, or vocational program  Occupational History   Not on file  Tobacco Use   Smoking status: Never   Smokeless tobacco: Never  Vaping Use   Vaping status: Never Used  Substance and Sexual Activity   Alcohol use: Yes    Comment: rarely   Drug use: No   Sexual activity: Not on file  Other Topics Concern   Not on file  Social History Narrative   Not on file   Social Drivers of Health   Financial Resource Strain: Low Risk  (10/18/2023)   Overall  Financial Resource Strain (CARDIA)    Difficulty of Paying Living Expenses: Not hard at all  Food Insecurity: No Food Insecurity (10/18/2023)   Hunger Vital Sign    Worried About Running Out of Food in the Last Year: Never true    Ran Out of Food in the Last Year: Never true  Transportation Needs: No Transportation Needs (10/18/2023)   PRAPARE - Administrator, Civil Service (Medical): No    Lack of Transportation (Non-Medical): No  Physical Activity: Insufficiently Active (10/18/2023)   Exercise Vital Sign    Days of Exercise per Week: 4 days    Minutes of Exercise per Session: 30 min  Stress: No Stress Concern Present (10/18/2023)   [employer] of Occupational Health - Occupational Stress Questionnaire    Feeling of Stress : Only a little  Social Connections: Socially Integrated (10/18/2023)   Social Connection and Isolation Panel    Frequency of Communication with Friends and Family: Three times a week    Frequency of Social Gatherings with Friends and Family: Once a week    Attends Religious Services: More than 4 times per year    Active Member of [employer] or Organizations: Yes    Attends Banker Meetings: More than 4 times  per year    Marital Status: Married   Family History  Problem Relation Age of Onset   Liver disease Mother    Hypertension Father    Colon cancer Neg Hx    Esophageal cancer Neg Hx    Rectal cancer Neg Hx    Stomach cancer Neg Hx    Allergies  Allergen Reactions   Sulfamethoxazole Hives   Current Outpatient Medications  Medication Sig Dispense Refill   acetaminophen  (TYLENOL ) 500 MG tablet Take 1 tablet (500 mg total) by mouth every 8 (eight) hours for 10 days. 30 tablet 0   aspirin  EC 325 MG tablet Take 1 tablet (325 mg total) by mouth daily. 14 tablet 0   oxyCODONE  (ROXICODONE ) 5 MG immediate release tablet Take 1 tablet (5 mg total) by mouth every 4 (four) hours as needed for severe pain (pain score 7-10) or breakthrough  pain. 10 tablet 0   amoxicillin  (AMOXIL ) 500 MG capsule Take 1 capsule (500 mg total) by mouth one hour before dental appointment 8 capsule 1   benazepril  (LOTENSIN ) 20 MG tablet Take 1 tablet (20 mg total) by mouth daily. 90 tablet 1   benzonatate  (TESSALON ) 200 MG capsule Take 1 capsule (200 mg total) by mouth 2 (two) times daily as needed for cough. 20 capsule 0   calcium carbonate (OSCAL) 1500 (600 Ca) MG TABS tablet Take by mouth 2 (two) times daily with a meal.     cetirizine-pseudoephedrine  (ZYRTEC-D) 5-120 MG tablet Take 1 tablet by mouth daily as needed for allergies.     cholecalciferol (VITAMIN D3) 25 MCG (1000 UNIT) tablet Take 1,000 Units by mouth daily.     FERROUS SULFATE PO Take 1 tablet by mouth daily.      meloxicam  (MOBIC ) 15 MG tablet Take 1 tablet (15 mg total) by mouth as needed for pain. 30 tablet 0   methocarbamol  (ROBAXIN ) 500 MG tablet Take 1 tablet (500 mg total) by mouth every 8 (eight) hours as needed for muscle spasms. 30 tablet 0   Multiple Vitamin (MULTIVITAMIN WITH MINERALS) TABS tablet Take 1 tablet by mouth daily.     No current facility-administered medications for this visit.   No results found.  Review of Systems:   A ROS was performed including pertinent positives and negatives as documented in the HPI.  Physical Exam :   Constitutional: NAD and appears stated age Neurological: Alert and oriented Psych: Appropriate affect and cooperative Last menstrual period 03/14/2014.   Comprehensive Musculoskeletal Exam:    Right shoulder with active forward elevation to 160 degrees compared to 160 on the left although there is 4 out of 5 strength with forward elevation.  External rotation at the side is 50 degrees bilaterally.  L1 is to side pocket.  Positive Neer impingement as well as speeds maneuver   Imaging:   Xray (3 views right shoulder): Normal  MRI (right shoulder): Full-thickness supraspinatus tear with minimal atrophy on T1 sagittal view.   There is intrasubstance biceps tearing   I personally reviewed and interpreted the radiographs.   Assessment and Plan:   63 y.o. female with right full-thickness rotator cuff tear.  At this time she has trialed both physical therapy as well as an injection.  This did give her temporary relief but she is now having additional pain sleeping on the side as well as with overhead activity.  Given this we did discuss treatment options.  I did discuss the possibility of right shoulder arthroscopy with biceps tenodesis  and rotator cuff repair.  I discussed the risks and limitations as well as associated recovery timeframe.  After discussion she would like to proceed  -Plan for right shoulder arthroscopy with rotator cuff repair and biceps tenodesis   After a lengthy discussion of treatment options, including risks, benefits, alternatives, complications of surgical and nonsurgical conservative options, the patient elected surgical repair.   The patient  is aware of the material risks  and complications including, but not limited to injury to adjacent structures, neurovascular injury, infection, numbness, bleeding, implant failure, thermal burns, stiffness, persistent pain, failure to heal, disease transmission from allograft, need for further surgery, dislocation, anesthetic risks, blood clots, risks of death,and others. The probabilities of surgical success and failure discussed with patient given their particular co-morbidities.The time and nature of expected rehabilitation and recovery was discussed.The patient's questions were all answered preoperatively.  No barriers to understanding were noted. I explained the natural history of the disease process and Rx rationale.  I explained to the patient what I considered to be reasonable expectations given their personal situation.  The final treatment plan was arrived at through a shared patient decision making process model.    I personally saw and evaluated  the patient, and participated in the management and treatment plan.  Elspeth Parker, MD Attending Physician, Orthopedic Surgery  This document was dictated using Dragon voice recognition software. A reasonable attempt at proof reading has been made to minimize errors.

## 2024-05-31 NOTE — Addendum Note (Signed)
 Addended by: WOLFGANG CONLEY HERO on: 05/31/2024 11:38 AM   Modules accepted: Orders

## 2024-06-09 ENCOUNTER — Ambulatory Visit (INDEPENDENT_AMBULATORY_CARE_PROVIDER_SITE_OTHER): Admitting: Family Medicine

## 2024-06-09 VITALS — BP 132/84 | HR 71 | Temp 97.0°F | Ht 64.0 in | Wt 254.2 lb

## 2024-06-09 DIAGNOSIS — Z5320 Procedure and treatment not carried out because of patient's decision for unspecified reasons: Secondary | ICD-10-CM

## 2024-06-09 DIAGNOSIS — E611 Iron deficiency: Secondary | ICD-10-CM | POA: Diagnosis not present

## 2024-06-09 DIAGNOSIS — Z0000 Encounter for general adult medical examination without abnormal findings: Secondary | ICD-10-CM | POA: Diagnosis not present

## 2024-06-09 DIAGNOSIS — I10 Essential (primary) hypertension: Secondary | ICD-10-CM

## 2024-06-09 DIAGNOSIS — E538 Deficiency of other specified B group vitamins: Secondary | ICD-10-CM | POA: Diagnosis not present

## 2024-06-09 DIAGNOSIS — Z6841 Body Mass Index (BMI) 40.0 and over, adult: Secondary | ICD-10-CM

## 2024-06-09 DIAGNOSIS — Z13220 Encounter for screening for lipoid disorders: Secondary | ICD-10-CM | POA: Diagnosis not present

## 2024-06-09 DIAGNOSIS — Z131 Encounter for screening for diabetes mellitus: Secondary | ICD-10-CM

## 2024-06-09 LAB — COMPREHENSIVE METABOLIC PANEL WITH GFR
ALT: 19 U/L (ref 0–35)
AST: 20 U/L (ref 0–37)
Albumin: 4.2 g/dL (ref 3.5–5.2)
Alkaline Phosphatase: 95 U/L (ref 39–117)
BUN: 16 mg/dL (ref 6–23)
CO2: 31 meq/L (ref 19–32)
Calcium: 9.6 mg/dL (ref 8.4–10.5)
Chloride: 102 meq/L (ref 96–112)
Creatinine, Ser: 0.71 mg/dL (ref 0.40–1.20)
GFR: 90.53 mL/min (ref >60.00)
Glucose, Bld: 94 mg/dL (ref 70–99)
Potassium: 4.2 meq/L (ref 3.5–5.1)
Sodium: 140 meq/L (ref 135–145)
Total Bilirubin: 0.4 mg/dL (ref 0.2–1.2)
Total Protein: 7.3 g/dL (ref 6.0–8.3)

## 2024-06-09 LAB — CBC WITH DIFFERENTIAL/PLATELET
Basophils Absolute: 0.1 K/uL (ref 0.0–0.1)
Basophils Relative: 1.2 % (ref 0.0–3.0)
Eosinophils Absolute: 0.2 K/uL (ref 0.0–0.7)
Eosinophils Relative: 4 % (ref 0.0–5.0)
HCT: 38.7 % (ref 36.0–46.0)
Hemoglobin: 12.8 g/dL (ref 12.0–15.0)
Lymphocytes Relative: 26.5 % (ref 12.0–46.0)
Lymphs Abs: 1.2 K/uL (ref 0.7–4.0)
MCHC: 33 g/dL (ref 30.0–36.0)
MCV: 91.7 fl (ref 78.0–100.0)
Monocytes Absolute: 0.5 K/uL (ref 0.1–1.0)
Monocytes Relative: 10.7 % (ref 3.0–12.0)
Neutro Abs: 2.5 K/uL (ref 1.4–7.7)
Neutrophils Relative %: 57.6 % (ref 43.0–77.0)
Platelets: 337 K/uL (ref 150.0–400.0)
RBC: 4.22 Mil/uL (ref 3.87–5.11)
RDW: 14 % (ref 11.5–15.5)
WBC: 4.4 K/uL (ref 4.0–10.5)

## 2024-06-09 LAB — URINALYSIS, ROUTINE W REFLEX MICROSCOPIC
Bilirubin Urine: NEGATIVE
Hgb urine dipstick: NEGATIVE
Ketones, ur: NEGATIVE
Leukocytes,Ua: NEGATIVE
Nitrite: NEGATIVE
RBC / HPF: NONE SEEN (ref 0–?)
Specific Gravity, Urine: 1.015 (ref 1.000–1.030)
Total Protein, Urine: NEGATIVE
Urine Glucose: NEGATIVE
Urobilinogen, UA: 0.2 (ref 0.0–1.0)
WBC, UA: NONE SEEN (ref 0–?)
pH: 7.5 (ref 5.0–8.0)

## 2024-06-09 LAB — IRON,TIBC AND FERRITIN PANEL
%SAT: 22 % (ref 16–45)
Ferritin: 132 ng/mL (ref 16–288)
Iron: 70 ug/dL (ref 45–160)
TIBC: 314 ug/dL (ref 250–450)

## 2024-06-09 LAB — HEMOGLOBIN A1C: Hgb A1c MFr Bld: 6.2 % (ref 4.6–6.5)

## 2024-06-09 LAB — LIPID PANEL
Cholesterol: 174 mg/dL (ref 0–200)
HDL: 61.4 mg/dL (ref >39.00)
LDL Cholesterol: 103 mg/dL — ABNORMAL HIGH (ref 0–99)
NonHDL: 112.28
Total CHOL/HDL Ratio: 3
Triglycerides: 48 mg/dL (ref 0.0–149.0)
VLDL: 9.6 mg/dL (ref 0.0–40.0)

## 2024-06-09 LAB — VITAMIN B12: Vitamin B-12: 1346 pg/mL — ABNORMAL HIGH (ref 211–911)

## 2024-06-09 NOTE — Progress Notes (Signed)
 Established Patient Office Visit   Subjective:  Patient ID: Hailey [name], female    DOB: 1960-10-22  Age: 63 y.o. MRN: [id number]  Chief Complaint  Patient presents with   Annual Exam    CPE. PT is fasting.     HPI Encounter Diagnoses  Name Primary?   Healthcare maintenance Yes   Essential hypertension    Screening for diabetes mellitus    Screening cholesterol level    Iron deficiency    Screening for hepatitis C declined    BMI 40.0-44.9, adult (HCC)    B12 nutritional deficiency    For routine physical and follow-up of above.  She is scheduled for shoulder surgery and is dealing with a lot of pain.  She is reluctant to take medications that could help out of concern for damage to her liver.  She does not smoke and rarely drinks alcohol.  She does not use illicit drugs.  She has regular dental care.  Female care is through her GYN at [facility].  She states that she has yearly Pap smears there.  They also arrange her mammograms.  She has no regular exercise outside of work.  She continues Lotensin  for treatment of hypertension.   Review of Systems  Constitutional: Negative.   HENT: Negative.    Eyes:  Negative for blurred vision, discharge and redness.  Respiratory: Negative.    Cardiovascular: Negative.   Gastrointestinal:  Negative for abdominal pain.  Genitourinary: Negative.   Musculoskeletal:  Positive for joint pain. Negative for myalgias.  Skin:  Negative for rash.  Neurological:  Negative for tingling, loss of consciousness and weakness.  Endo/Heme/Allergies:  Negative for polydipsia.     Current Outpatient Medications:    acetaminophen  (TYLENOL ) 500 MG tablet, Take 1 tablet (500 mg total) by mouth every 8 (eight) hours for 10 days., Disp: 30 tablet, Rfl: 0   aspirin  EC 325 MG tablet, Take 1 tablet (325 mg total) by mouth daily., Disp: 14 tablet, Rfl: 0   benazepril  (LOTENSIN ) 20 MG tablet, Take 1 tablet (20 mg total) by mouth daily., Disp: 90  tablet, Rfl: 1   calcium carbonate (OSCAL) 1500 (600 Ca) MG TABS tablet, Take by mouth 2 (two) times daily with a meal., Disp: , Rfl:    cetirizine-pseudoephedrine  (ZYRTEC-D) 5-120 MG tablet, Take 1 tablet by mouth daily as needed for allergies., Disp: , Rfl:    cholecalciferol (VITAMIN D3) 25 MCG (1000 UNIT) tablet, Take 1,000 Units by mouth daily., Disp: , Rfl:    FERROUS SULFATE PO, Take 1 tablet by mouth daily. , Disp: , Rfl:    meloxicam  (MOBIC ) 15 MG tablet, Take 1 tablet (15 mg total) by mouth as needed for pain., Disp: 30 tablet, Rfl: 0   methocarbamol  (ROBAXIN ) 500 MG tablet, Take 1 tablet (500 mg total) by mouth every 8 (eight) hours as needed for muscle spasms., Disp: 30 tablet, Rfl: 0   Multiple Vitamin (MULTIVITAMIN WITH MINERALS) TABS tablet, Take 1 tablet by mouth daily., Disp: , Rfl:    Objective:     BP 132/84 (BP Location: Left Arm, Patient Position: Sitting, Cuff Size: Normal)   Pulse 71   Temp (!) 97 F (36.1 C) (Temporal)   Ht 5' 4 (1.626 m)   Wt 254 lb 3.2 oz (115.3 kg)   LMP 03/14/2014 (Approximate) Comment: perimenopause  SpO2 96%   BMI 43.63 kg/m    Physical Exam Constitutional:      General: She is not in acute  distress.    Appearance: Normal appearance. She is not ill-appearing, toxic-appearing or diaphoretic.  HENT:     Head: Normocephalic and atraumatic.     Right Ear: Tympanic membrane, ear canal and external ear normal.     Left Ear: Tympanic membrane, ear canal and external ear normal.     Mouth/Throat:     Mouth: Mucous membranes are moist.     Pharynx: Oropharynx is clear. No oropharyngeal exudate or posterior oropharyngeal erythema.  Eyes:     General: No scleral icterus.       Right eye: No discharge.        Left eye: No discharge.     Extraocular Movements: Extraocular movements intact.     Conjunctiva/sclera: Conjunctivae normal.     Pupils: Pupils are equal, round, and reactive to light.  Cardiovascular:     Rate and Rhythm: Normal  rate and regular rhythm.  Pulmonary:     Effort: Pulmonary effort is normal. No respiratory distress.     Breath sounds: Normal breath sounds.  Abdominal:     General: Bowel sounds are normal.     Tenderness: There is no right CVA tenderness or left CVA tenderness.  Musculoskeletal:     Cervical back: No rigidity or tenderness.  Skin:    General: Skin is warm and dry.  Neurological:     Mental Status: She is alert and oriented to person, place, and time.  Psychiatric:        Mood and Affect: Mood normal.        Behavior: Behavior normal.      No results found for any visits on 06/09/24.    The 10-year ASCVD risk score (Arnett DK, et al., 2019) is: 7.6%    Assessment & Plan:   Healthcare maintenance  Essential hypertension -     CBC with Differential/Platelet -     Comprehensive metabolic panel with GFR -     Urinalysis, Routine w reflex microscopic  Screening for diabetes mellitus -     Comprehensive metabolic panel with GFR -     Hemoglobin A1c  Screening cholesterol level -     Comprehensive metabolic panel with GFR -     Lipid panel  Iron deficiency -     CBC with Differential/Platelet -     Iron, TIBC and Ferritin Panel  Screening for hepatitis C declined  BMI 40.0-44.9, adult (HCC)  B12 nutritional deficiency -     Vitamin B12    Return in about 1 year (around 06/09/2025), or if symptoms worsen or fail to improve.  Patient is scheduled for rotator cuff surgery soon.  She expressed interest in starting a regular exercise routine such as walking after she recovers from her surgery.  Advised her to exercise for at least 30 minutes most days of the week outside of work.  She is not interested in addressing her weight issue at this time as well.  She was given information on exercising to lose weight.  She declined screening for HIV and hep C.  Elsie Sim Lent, MD

## 2024-06-12 ENCOUNTER — Ambulatory Visit: Payer: Self-pay | Admitting: Family Medicine

## 2024-06-14 ENCOUNTER — Encounter: Payer: Self-pay | Admitting: Family Medicine

## 2024-06-14 ENCOUNTER — Telehealth: Payer: Self-pay | Admitting: Orthopaedic Surgery

## 2024-06-14 NOTE — Telephone Encounter (Signed)
 Patient called and said she can't get a to Hailey [name] to schedule surgery. She stated that she leaves messages and not return.  Her shoulder is in pain. [phone]

## 2024-06-15 ENCOUNTER — Other Ambulatory Visit (HOSPITAL_BASED_OUTPATIENT_CLINIC_OR_DEPARTMENT_OTHER): Payer: Self-pay | Admitting: Orthopaedic Surgery

## 2024-06-15 ENCOUNTER — Other Ambulatory Visit: Payer: Self-pay

## 2024-06-15 ENCOUNTER — Telehealth: Payer: Self-pay | Admitting: Orthopaedic Surgery

## 2024-06-15 MED ORDER — METHOCARBAMOL 500 MG PO TABS
500.0000 mg | ORAL_TABLET | Freq: Two times a day (BID) | ORAL | 1 refills | Status: AC
  Filled 2024-06-15: qty 60, 30d supply, fill #0

## 2024-06-15 MED ORDER — MELOXICAM 15 MG PO TABS
15.0000 mg | ORAL_TABLET | Freq: Every day | ORAL | 2 refills | Status: DC
  Filled 2024-06-15: qty 30, 30d supply, fill #0

## 2024-06-15 NOTE — Telephone Encounter (Signed)
 I spoke with the patient and scheduled her for surgery on 10/16. Patient states her job does not have light duty and she will have to continue to work until surgery. Due to that patient is requesting a refill on her muscle relaxer and Meloxicam . Patient would like rx's sent to [facility]. Please advise patient.

## 2024-06-16 NOTE — Telephone Encounter (Signed)
 I spoke with the patient on 9/18 and scheduled her for surgery on 10/16.

## 2024-06-19 ENCOUNTER — Other Ambulatory Visit: Payer: Self-pay

## 2024-06-19 NOTE — Telephone Encounter (Signed)
 Spoke to patient who stated she already picked up meds

## 2024-07-13 ENCOUNTER — Encounter (HOSPITAL_BASED_OUTPATIENT_CLINIC_OR_DEPARTMENT_OTHER): Payer: Self-pay | Admitting: Orthopaedic Surgery

## 2024-07-13 DIAGNOSIS — I10 Essential (primary) hypertension: Secondary | ICD-10-CM | POA: Diagnosis not present

## 2024-07-13 DIAGNOSIS — S46011A Strain of muscle(s) and tendon(s) of the rotator cuff of right shoulder, initial encounter: Secondary | ICD-10-CM | POA: Diagnosis not present

## 2024-07-13 DIAGNOSIS — G8918 Other acute postprocedural pain: Secondary | ICD-10-CM | POA: Diagnosis not present

## 2024-07-13 DIAGNOSIS — M7521 Bicipital tendinitis, right shoulder: Secondary | ICD-10-CM | POA: Diagnosis not present

## 2024-07-13 DIAGNOSIS — M75111 Incomplete rotator cuff tear or rupture of right shoulder, not specified as traumatic: Secondary | ICD-10-CM | POA: Diagnosis not present

## 2024-07-13 DIAGNOSIS — M66821 Spontaneous rupture of other tendons, right upper arm: Secondary | ICD-10-CM | POA: Diagnosis not present

## 2024-07-13 DIAGNOSIS — M75101 Unspecified rotator cuff tear or rupture of right shoulder, not specified as traumatic: Secondary | ICD-10-CM | POA: Diagnosis not present

## 2024-07-13 DIAGNOSIS — M65921 Unspecified synovitis and tenosynovitis, right upper arm: Secondary | ICD-10-CM | POA: Diagnosis not present

## 2024-07-13 NOTE — Progress Notes (Signed)
 Date of Surgery: 07/13/2024  INDICATIONS: Hailey [name] is a 63 y.o.-year-old female with right shoulder rotator cuff tear.  The risk and benefits of the procedure were discussed in detail and documented in the pre-operative evaluation.   PREOPERATIVE DIAGNOSES: Right shoulder, chronic rotator cuff tear and biceps tendinitis.  POSTOPERATIVE DIAGNOSIS: Same.  PROCEDURE: Arthroscopic limited debridement - 70177 Arthroscopic rotator cuff repair - 70172 Arthroscopic biceps tenodesis - 70171  SURGEON: Elspeth LITTIE Parker MD  ASSISTANT: Hailey [name], ATC  ANESTHESIA:  general  IV FLUIDS AND URINE: See anesthesia record.  ANTIBIOTICS: Ancef   ESTIMATED BLOOD LOSS: 5 mL.  IMPLANTS:  * No surgical log found *  DRAINS: None  CULTURES: None  COMPLICATIONS: none  PROCEDURE:    OPERATIVE FINDING: Exam under anesthesia:   Examination under anesthesia revealed forward elevation of 150 degrees.  With the arm at the side, there was 65 degrees of external rotation.  There is a 1+ anterior load shift and a 1+ posterior load shift.    Arthroscopic findings demonstrated: Articular space: Rotator interval redness Chondral surfaces: Normal Biceps: fraying/torn Subscapularis: Intact Supraspinatus: full thickness tear Infraspinatus: Intact    I identified the patient in the pre-operative holding area.  I marked the operative right shoulder with my initials. I reviewed the risks and benefits of the proposed surgical intervention and the patient wished to proceed.  Anesthesia was then performed with regional block.  The patient was transferred to the operative suite and placed in the beach chair position with all bony prominences padded.     SCDs were placed on bilateral lower extremity. Appropriate antibiotics was administered within 1 hour before incision.  Anesthesia was induced.  The operative extremity was then prepped and draped in standard fashion. A time out was performed confirming  the correct extremity, correct patient and correct procedure.   The arthroscope was introduced in the glenohumeral joint from a posterior portal.  An anterior portal was created.  The shoulder was examined and the above findings were noted.     With an arthroscopic shaver and a wand ablator, synovitis throughout the  shoulder was resected.  The arthroscopic shaver was used to excise torn portions of the labrum back to a stable margin. Specifically this was done for the anterior and superior labrum. The rotator interval was debrided using shaver and electrocautery.  At this time the biceps was tagged with a self passing device with a #2 nonabsorbable suture twice.  This was done through the anterior portal.  This was placed in a single 2.9 Quattro link anchor.   Through visualization from intra-articular, the footprint of the rotator cuff was debrided of soft tissues back to bleeding bone.  This was done with an arthroscopic shaver.     The rotator cuff was then approached through the subacromial space. Anterior, lateral, and posterior portals were used.  Bursectomy was performed with an arthroscopic shaver and ArthroCare wand.  The soft tissues and 1 mm of bone on the undersurface of the acromion and clavicle were resected with the arthroscopic shaver and wand. There was good excursion that was noted of the tendon back to its footprint which would be amendable to an repair.   The rotator cuff was repaired with a double row configuration with two medial row all suture suture anchors as noted above with sutures passed from anterior to posterior in horizontal fashion with a self passing suture device.  The posterior 2 sutures were tied with knots in order to ultimately reapproximate  the tendon to its anatomic footprint. A total of 8 limbs of suture were passed.  The sutures were placed with a two lateral row anchors.  This provided excellent anatomic footprint approximation.   The shoulder was irrigated.   The arthroscopic instruments were removed.  Wounds were closed with 3-0 nylon sutures.  A sterile dressing was applied with xeroform, 4x8s, abdominal pad, and tape. An Rosalita was placed and the upper extremity was placed in a shoulder immobilizer.  The patient tolerated the procedure well and was taken to the recovery room in stable condition.  All counts were correct in the case. The patient tolerated the procedure well and was taken to the recovery room in stable condition.    POSTOPERATIVE PLAN: She will follow the rotator cuff repair protocol. She will be placed on aspirin  for DVT prophylaxsis.  Elspeth LITTIE Parker, MD 3:54 PM

## 2024-07-17 NOTE — Therapy (Unsigned)
 OUTPATIENT PHYSICAL THERAPY SHOULDER EVALUATION   Patient Name: Hailey [name] MRN: [id number] DOB:12/08/1960, 63 y.o., female Today's Date: 07/18/2024  END OF SESSION:  PT End of Session - 07/18/24 1023     Visit Number 1    Number of Visits 24    Date for Recertification  10/10/24    Authorization Type [employer]    Progress Note Due on Visit 10    PT Start Time 1020    PT Stop Time 1100    PT Time Calculation (min) 40 min    Activity Tolerance Patient limited by pain    Behavior During Therapy [facility] for tasks assessed/performed          Past Medical History:  Diagnosis Date   Allergy    Anemia    Arthritis    B12 deficiency    CARPAL TUNNEL SYNDROME, BILATERAL 08/04/2010   GERD 03/06/2008   HYPERTENSION 02/10/2007   Obesity    OBESITY NOS 02/10/2007   Sinusitis    Swelling    Vitamin D  deficiency    Past Surgical History:  Procedure Laterality Date   CARPAL TUNNEL RELEASE     Both hands   COLONOSCOPY     2012 at [facility]   HEMORRHOID SURGERY     NASAL SINUS SURGERY     TOTAL KNEE ARTHROPLASTY Left 10/16/2019   Procedure: LEFT TOTAL KNEE ARTHROPLASTY;  Surgeon: Jerri Kay HERO, MD;  Location: [facility] OR;  Service: Orthopedics;  Laterality: Left;   Patient Active Problem List   Diagnosis Date Noted   BMI 40.0-44.9, adult (HCC) 05/24/2023   Acute sinusitis 02/23/2023   Postnasal drip 02/23/2023   Osteopenia 11/24/2021   Thoracic radiculopathy 10/23/2021   Thoracic myofascial strain 10/23/2021   Viral syndrome 03/27/2021   Primary osteoarthritis of right knee 09/10/2020   At risk for activity intolerance 09/03/2020   At risk for complication associated with hypotension 07/18/2020   Vitamin D  deficiency 05/30/2020   B12 nutritional deficiency 05/30/2020   Anemia 05/30/2020   Status post total left knee replacement 10/16/2019   Primary osteoarthritis of left knee 09/06/2019   Screening for diabetes mellitus 02/24/2019   Chronic sinusitis 10/04/2018    CARPAL TUNNEL SYNDROME, BILATERAL 08/04/2010   Viral upper respiratory tract infection 10/16/2008   GERD 03/06/2008   Class 2 obesity due to excess calories without serious comorbidity with body mass index (BMI) of 38.0 to 38.9 in adult 02/10/2007   Essential hypertension 02/10/2007    PCP: Berneta Elsie Sayre, MD  REFERRING PROVIDER: Genelle Standing, MD  REFERRING DIAG: Traumatic complete tear of right rotator cuff, initial encounter [S46.011A]   THERAPY DIAG:  Acute pain of right shoulder - Plan: PT plan of care cert/re-cert  Muscle weakness (generalized) - Plan: PT plan of care cert/re-cert  Rationale for Evaluation and Treatment: Rehabilitation  ONSET DATE: 07/13/24  SUBJECTIVE:  SUBJECTIVE STATEMENT: Pt reports to PT s/p R RTC repair.  Hand dominance: Left  PERTINENT HISTORY: Allergy, Anemia, B12 deficiency, Carpal tunnel syndrome, GERD, HTN, Obesity  PAIN:  Are you having pain? Yes: NPRS scale: 4/10 Pain location: R shoulder Pain description: Achy pain  Aggravating factors: Moving it Relieving factors: Rest, ice, meds  PRECAUTIONS: Shoulder  RED FLAGS: None   WEIGHT BEARING RESTRICTIONS: No  FALLS:  Has patient fallen in last 6 months? 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   NEXT MD VISIT:   OBJECTIVE:  Note: Objective measures were completed at Evaluation unless otherwise noted.  DIAGNOSTIC FINDINGS:  1. Complete full-thickness, full width tear of the supraspinatus tendon with 3.3 cm of retraction. 2. Moderate infraspinatus tendinosis. 3. Complete subscapularis tear with 3.7 cm of  retraction. 4. Medial dislocation of the proximal long head of the biceps tendon with moderate tendinosis. 5. Mild osteoarthritis of the glenohumeral joint.  PATIENT SURVEYS:  Quick Dash: 100  COGNITION: Overall cognitive status: Within functional limits for tasks assessed     SENSATION: WFL  POSTURE: Rounded   UPPER EXTREMITY ROM:   Passive ROM Right eval Left eval  Shoulder flexion    Shoulder extension    Shoulder abduction    Shoulder adduction    Shoulder internal rotation    Shoulder external rotation    Elbow flexion    Elbow extension    Wrist flexion    Wrist extension    Wrist ulnar deviation    Wrist radial deviation    Wrist pronation    Wrist supination    (Blank rows = not tested)  UPPER EXTREMITY MMT:  MMT Right eval Left eval  Shoulder flexion    Shoulder extension    Shoulder abduction    Shoulder adduction    Shoulder internal rotation    Shoulder external rotation    Middle trapezius    Lower trapezius    Elbow flexion    Elbow extension    Wrist flexion    Wrist extension    Wrist ulnar deviation    Wrist radial deviation    Wrist pronation    Wrist supination    Grip strength (lbs)    (Blank rows = not tested)  JOINT MOBILITY TESTING:  Not able to assess with movement today due to surgery.   PALPATION:  Tenderness surrounding sutures and under armpit at lat insertion.                                                                                                                              TREATMENT DATE: Creating, reviewing, and completing below HEP  - PROM into flexion, ER and Ab to tolerance and within protocol -Scap retraction - Elbow flex/ ext - UT stretch  PATIENT EDUCATION: Education details: Educated pt on anatomy and physiology of current symptoms, DASH, diagnosis, prognosis, HEP,  and POC Person educated: Patient Education method: Explanation, Demonstration, Verbal cues, and Handouts Education  comprehension: verbalized understanding and returned demonstration  HOME EXERCISE PROGRAM: Access Code: [email or web address] URL: [email or web address] Date: 07/18/2024 Prepared by: Rojean Batten  Exercises - Seated Scapular Retraction  - 1 x daily - 7 x weekly - 3 sets - 10 reps - Seated Cervical Sidebending Stretch  - 1 x daily - 7 x weekly - 3 sets - 10 reps - Seated Levator Scapulae Stretch  - 1 x daily - 7 x weekly - 3 sets - 10 reps - Seated Elbow Flexion and Extension AROM  - 1 x daily - 7 x weekly - 3 sets - 10 reps  ASSESSMENT:  CLINICAL IMPRESSION: Patient referred to PT for  s/p R RTC repair. Changed dressings, reviewed precautions, demonstrated and reviewed HEP with pt. She is doing well, pain is managed by rest, ice and meds. She is using her sling and having her husband help her with ADL's. No active bleeding or signs of infection. Pt tolerated PROM fairly with pain as expected.Patient will benefit from skilled PT to address below impairments, limitations and improve overall function.  OBJECTIVE IMPAIRMENTS: decreased activity tolerance, decreased shoulder mobility, decreased ROM, decreased strength, impaired flexibility, impaired UE use, postural dysfunction, and pain.  ACTIVITY LIMITATIONS: reaching, lifting, carry,  cleaning, driving, and or occupation  PERSONAL FACTORS:  also affecting patient's functional outcome.  REHAB POTENTIAL: Good  CLINICAL DECISION MAKING: Stable/uncomplicated  EVALUATION COMPLEXITY: Low    GOALS: Short term PT Goals Target date: 08/01/24 Pt will be I and compliant with HEP. Baseline:  Goal status: New Pt will decrease pain by 25% overall Baseline: Goal status: New  Long term PT goals Target date: 10/10/24 Pt will improve Rt shoulder AROM to [facility] to improve functional reaching Baseline: Goal status: New Pt will improve  Rt shoulder strength to at least 4+/5 MMT to improve functional strength Baseline: Goal status: New Pt will  improve Quick Dash to at least 60% functional to show improved function Baseline: Goal status: New Pt will reduce pain to overall less than 3/10 with usual activity and work activity. Baseline: Goal status: New  PLAN: PT FREQUENCY: 1-2  PT DURATION: 8-12 weeks  PLANNED INTERVENTIONS (unless contraindicated): aquatic PT, Canalith repositioning, cryotherapy, Electrical stimulation, Iontophoresis with 4 mg/ml dexamethasome, Moist heat, traction, Ultrasound, gait training, Therapeutic exercise, balance training, neuromuscular re-education, patient/family education, prosthetic training, manual techniques, passive ROM, dry needling, taping, vasopnuematic device, vestibular, spinal manipulations, joint manipulations  PLAN FOR NEXT SESSION: Follow protocol.     Rojean JONELLE Batten, PT 07/18/2024, 11:33 AM

## 2024-07-18 ENCOUNTER — Other Ambulatory Visit: Payer: Self-pay

## 2024-07-18 ENCOUNTER — Encounter (HOSPITAL_BASED_OUTPATIENT_CLINIC_OR_DEPARTMENT_OTHER): Payer: Self-pay | Admitting: Physical Therapy

## 2024-07-18 ENCOUNTER — Ambulatory Visit (HOSPITAL_BASED_OUTPATIENT_CLINIC_OR_DEPARTMENT_OTHER): Attending: Orthopaedic Surgery | Admitting: Physical Therapy

## 2024-07-18 DIAGNOSIS — G8929 Other chronic pain: Secondary | ICD-10-CM | POA: Insufficient documentation

## 2024-07-18 DIAGNOSIS — S46011A Strain of muscle(s) and tendon(s) of the rotator cuff of right shoulder, initial encounter: Secondary | ICD-10-CM | POA: Insufficient documentation

## 2024-07-18 DIAGNOSIS — M25611 Stiffness of right shoulder, not elsewhere classified: Secondary | ICD-10-CM | POA: Diagnosis not present

## 2024-07-18 DIAGNOSIS — M25511 Pain in right shoulder: Secondary | ICD-10-CM | POA: Diagnosis not present

## 2024-07-18 DIAGNOSIS — M6281 Muscle weakness (generalized): Secondary | ICD-10-CM | POA: Insufficient documentation

## 2024-07-24 NOTE — Therapy (Unsigned)
 OUTPATIENT PHYSICAL THERAPY SHOULDER TREATMENT   Patient Name: Hailey [name] MRN: [id number] DOB:09-Jan-1961, 63 y.o., female Today's Date: 07/25/2024  END OF SESSION:  PT End of Session - 07/25/24 1143     Visit Number 2    Number of Visits 24    Date for Recertification  10/10/24    Authorization Type [employer]    Progress Note Due on Visit 10    PT Start Time 1013    PT Stop Time 1053    PT Time Calculation (min) 40 min    Activity Tolerance Patient tolerated treatment well;Patient limited by pain    Behavior During Therapy [facility] for tasks assessed/performed           Past Medical History:  Diagnosis Date   Allergy    Anemia    Arthritis    B12 deficiency    CARPAL TUNNEL SYNDROME, BILATERAL 08/04/2010   GERD 03/06/2008   HYPERTENSION 02/10/2007   Obesity    OBESITY NOS 02/10/2007   Sinusitis    Swelling    Vitamin D  deficiency    Past Surgical History:  Procedure Laterality Date   CARPAL TUNNEL RELEASE     Both hands   COLONOSCOPY     2012 at [facility]   HEMORRHOID SURGERY     NASAL SINUS SURGERY     TOTAL KNEE ARTHROPLASTY Left 10/16/2019   Procedure: LEFT TOTAL KNEE ARTHROPLASTY;  Surgeon: Jerri Kay HERO, MD;  Location: [facility] OR;  Service: Orthopedics;  Laterality: Left;   Patient Active Problem List   Diagnosis Date Noted   BMI 40.0-44.9, adult (HCC) 05/24/2023   Acute sinusitis 02/23/2023   Postnasal drip 02/23/2023   Osteopenia 11/24/2021   Thoracic radiculopathy 10/23/2021   Thoracic myofascial strain 10/23/2021   Viral syndrome 03/27/2021   Primary osteoarthritis of right knee 09/10/2020   At risk for activity intolerance 09/03/2020   At risk for complication associated with hypotension 07/18/2020   Vitamin D  deficiency 05/30/2020   B12 nutritional deficiency 05/30/2020   Anemia 05/30/2020   Status post total left knee replacement 10/16/2019   Primary osteoarthritis of left knee 09/06/2019   Screening for diabetes mellitus 02/24/2019    Chronic sinusitis 10/04/2018   CARPAL TUNNEL SYNDROME, BILATERAL 08/04/2010   Viral upper respiratory tract infection 10/16/2008   GERD 03/06/2008   Class 2 obesity due to excess calories without serious comorbidity with body mass index (BMI) of 38.0 to 38.9 in adult 02/10/2007   Essential hypertension 02/10/2007    PCP: Berneta Elsie Sayre, MD  REFERRING PROVIDER: Genelle Standing, MD  REFERRING DIAG: Traumatic complete tear of right rotator cuff, initial encounter [S46.011A]   THERAPY DIAG:  Muscle weakness (generalized)  Acute pain of right shoulder  Impaired range of motion of right shoulder  Chronic right shoulder pain  Rationale for Evaluation and Treatment: Rehabilitation  ONSET DATE: 07/13/24  SUBJECTIVE:  SUBJECTIVE STATEMENT: Pt states that she is doing well. Her husband is trying to help her with dressing changes, but struggles due to shakiness from meds and other health conditions. She is able to move more with less pain per protocol.   Hand dominance: Left  PERTINENT HISTORY: Allergy, Anemia, B12 deficiency, Carpal tunnel syndrome, GERD, HTN, Obesity  PAIN:  Are you having pain? Yes: NPRS scale: 4/10 Pain location: R shoulder Pain description: Achy pain  Aggravating factors: Moving it Relieving factors: Rest, ice, meds  PRECAUTIONS: Shoulder  RED FLAGS: None   WEIGHT BEARING RESTRICTIONS: No  FALLS:  Has patient fallen in last 6 months? 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   NEXT MD VISIT:   OBJECTIVE:  Note: Objective measures were completed at Evaluation unless otherwise  noted.  DIAGNOSTIC FINDINGS:  1. Complete full-thickness, full width tear of the supraspinatus tendon with 3.3 cm of retraction. 2. Moderate infraspinatus tendinosis. 3. Complete subscapularis tear with 3.7 cm of retraction. 4. Medial dislocation of the proximal long head of the biceps tendon with moderate tendinosis. 5. Mild osteoarthritis of the glenohumeral joint.  PATIENT SURVEYS:  Quick Dash: 100  COGNITION: Overall cognitive status: Within functional limits for tasks assessed     SENSATION: WFL  POSTURE: Rounded   UPPER EXTREMITY ROM:   Passive ROM Right eval Left eval  Shoulder flexion    Shoulder extension    Shoulder abduction    Shoulder adduction    Shoulder internal rotation    Shoulder external rotation    Elbow flexion    Elbow extension    Wrist flexion    Wrist extension    Wrist ulnar deviation    Wrist radial deviation    Wrist pronation    Wrist supination    (Blank rows = not tested)  UPPER EXTREMITY MMT:  MMT Right eval Left eval  Shoulder flexion    Shoulder extension    Shoulder abduction    Shoulder adduction    Shoulder internal rotation    Shoulder external rotation    Middle trapezius    Lower trapezius    Elbow flexion    Elbow extension    Wrist flexion    Wrist extension    Wrist ulnar deviation    Wrist radial deviation    Wrist pronation    Wrist supination    Grip strength (lbs)    (Blank rows = not tested)  JOINT MOBILITY TESTING:  Not able to assess with movement today due to surgery.   PALPATION:  Tenderness surrounding sutures and under armpit at lat insertion.  TREATMENT DATE:  07/21/24: - elbow flex/ ext  - PROM into flexion, ER and ABD to tolerance and within protocol - UT stretch  - Fixed bandages to ensure they were waterproof.  - Scap retraction - STM to UT and lats     07/18/24: Creating, reviewing, and completing below HEP  - PROM into flexion, ER and Ab to tolerance and within protocol -Scap retraction - Elbow flex/ ext - UT stretch    PATIENT EDUCATION: Education details: Educated pt on anatomy and physiology of current symptoms, DASH, diagnosis, prognosis, HEP,  and POC Person educated: Patient Education method: Explanation, Demonstration, Verbal cues, and Handouts Education comprehension: verbalized understanding and returned demonstration  HOME EXERCISE PROGRAM: Access Code: [email or web address] URL: [email or web address] Date: 07/18/2024 Prepared by: Rojean Batten  Exercises - Seated Scapular Retraction  - 1 x daily - 7 x weekly - 3 sets - 10 reps - Seated Cervical Sidebending Stretch  - 1 x daily - 7 x weekly - 3 sets - 10 reps - Seated Levator Scapulae Stretch  - 1 x daily - 7 x weekly - 3 sets - 10 reps - Seated Elbow Flexion and Extension AROM  - 1 x daily - 7 x weekly - 3 sets - 10 reps  ASSESSMENT:  CLINICAL IMPRESSION:  Pt tolerated session well. She was limited secondary to pain into flexion as expected post op. She is doing well with her HEP and progressions with PT at this time.    Eval:Patient referred to PT for  s/p R RTC repair. Changed dressings, reviewed precautions, demonstrated and reviewed HEP with pt. She is doing well, pain is managed by rest, ice and meds. She is using her sling and having her husband help her with ADL's. No active bleeding or signs of infection. Pt tolerated PROM fairly with pain as expected.Patient will benefit from skilled PT to address below impairments, limitations and improve overall function.  OBJECTIVE IMPAIRMENTS: decreased activity tolerance, decreased shoulder mobility, decreased ROM, decreased strength, impaired flexibility, impaired UE use, postural dysfunction, and pain.  ACTIVITY LIMITATIONS: reaching, lifting, carry,  cleaning, driving, and or occupation  PERSONAL FACTORS:   also affecting patient's functional outcome.  REHAB POTENTIAL: Good  CLINICAL DECISION MAKING: Stable/uncomplicated  EVALUATION COMPLEXITY: Low    GOALS: Short term PT Goals Target date: 08/01/24 Pt will be I and compliant with HEP. Baseline:  Goal status: New Pt will decrease pain by 25% overall Baseline: Goal status: New  Long term PT goals Target date: 10/10/24 Pt will improve Rt shoulder AROM to [facility] to improve functional reaching Baseline: Goal status: New Pt will improve  Rt shoulder strength to at least 4+/5 MMT to improve functional strength Baseline: Goal status: New Pt will improve Quick Dash to at least 60% functional to show improved function Baseline: Goal status: New Pt will reduce pain to overall less than 3/10 with usual activity and work activity. Baseline: Goal status: New  PLAN: PT FREQUENCY: 1-2  PT DURATION: 8-12 weeks  PLANNED INTERVENTIONS (unless contraindicated): aquatic PT, Canalith repositioning, cryotherapy, Electrical stimulation, Iontophoresis with 4 mg/ml dexamethasome, Moist heat, traction, Ultrasound, gait training, Therapeutic exercise, balance training, neuromuscular re-education, patient/family education, prosthetic training, manual techniques, passive ROM, dry needling, taping, vasopnuematic device, vestibular, spinal manipulations, joint manipulations  PLAN FOR NEXT SESSION: Follow protocol.     Rojean JONELLE Batten, PT 07/25/2024, 11:45 AM

## 2024-07-25 ENCOUNTER — Ambulatory Visit (HOSPITAL_BASED_OUTPATIENT_CLINIC_OR_DEPARTMENT_OTHER): Admitting: Physical Therapy

## 2024-07-25 DIAGNOSIS — G8929 Other chronic pain: Secondary | ICD-10-CM

## 2024-07-25 DIAGNOSIS — M6281 Muscle weakness (generalized): Secondary | ICD-10-CM | POA: Diagnosis not present

## 2024-07-25 DIAGNOSIS — M25511 Pain in right shoulder: Secondary | ICD-10-CM

## 2024-07-25 DIAGNOSIS — M25611 Stiffness of right shoulder, not elsewhere classified: Secondary | ICD-10-CM | POA: Diagnosis not present

## 2024-07-25 DIAGNOSIS — S46011A Strain of muscle(s) and tendon(s) of the rotator cuff of right shoulder, initial encounter: Secondary | ICD-10-CM | POA: Diagnosis not present

## 2024-07-27 ENCOUNTER — Ambulatory Visit (INDEPENDENT_AMBULATORY_CARE_PROVIDER_SITE_OTHER): Admitting: Orthopaedic Surgery

## 2024-07-27 DIAGNOSIS — S46011A Strain of muscle(s) and tendon(s) of the rotator cuff of right shoulder, initial encounter: Secondary | ICD-10-CM

## 2024-07-27 NOTE — Progress Notes (Signed)
 Post Operative Evaluation    Procedure/Date of Surgery: Right shoulder rotator cuff repair biceps tenodesis 10/16  Interval History:   Presents 2 weeks status post the above procedure.  Overall she is doing extremely well.  She is working with physical therapy and range of motion is coming along passively   PMH/PSH/Family History/Social History/Meds/Allergies:    Past Medical History:  Diagnosis Date   Allergy    Anemia    Arthritis    B12 deficiency    CARPAL TUNNEL SYNDROME, BILATERAL 08/04/2010   GERD 03/06/2008   HYPERTENSION 02/10/2007   Obesity    OBESITY NOS 02/10/2007   Sinusitis    Swelling    Vitamin D  deficiency    Past Surgical History:  Procedure Laterality Date   CARPAL TUNNEL RELEASE     Both hands   COLONOSCOPY     2012 at [facility]   HEMORRHOID SURGERY     NASAL SINUS SURGERY     TOTAL KNEE ARTHROPLASTY Left 10/16/2019   Procedure: LEFT TOTAL KNEE ARTHROPLASTY;  Surgeon: Jerri Kay HERO, MD;  Location: [facility] OR;  Service: Orthopedics;  Laterality: Left;   Social History   Socioeconomic History   Marital status: Married    Spouse name: Not on file   Number of children: Not on file   Years of education: Not on file   Highest education level: Associate degree: occupational, scientist, product/process development, or vocational program  Occupational History   Not on file  Tobacco Use   Smoking status: Never   Smokeless tobacco: Never  Vaping Use   Vaping status: Never Used  Substance and Sexual Activity   Alcohol use: Yes    Comment: rarely   Drug use: No   Sexual activity: Not on file  Other Topics Concern   Not on file  Social History Narrative   Not on file   Social Drivers of Health   Financial Resource Strain: Low Risk  (06/09/2024)   Overall Financial Resource Strain (CARDIA)    Difficulty of Paying Living Expenses: Not hard at all  Food Insecurity: No Food Insecurity (06/09/2024)   Hunger Vital Sign    Worried About Running Out of  Food in the Last Year: Never true    Ran Out of Food in the Last Year: Never true  Transportation Needs: No Transportation Needs (06/09/2024)   PRAPARE - Administrator, Civil Service (Medical): No    Lack of Transportation (Non-Medical): No  Physical Activity: Sufficiently Active (06/09/2024)   Exercise Vital Sign    Days of Exercise per Week: 5 days    Minutes of Exercise per Session: 60 min  Stress: Stress Concern Present (06/09/2024)   [employer] of Occupational Health - Occupational Stress Questionnaire    Feeling of Stress: Very much  Social Connections: Socially Integrated (06/09/2024)   Social Connection and Isolation Panel    Frequency of Communication with Friends and Family: More than three times a week    Frequency of Social Gatherings with Friends and Family: Once a week    Attends Religious Services: More than 4 times per year    Active Member of [employer] or Organizations: Yes    Attends Engineer, Structural: More than 4 times per year    Marital Status: Married   Family History  Problem Relation  Age of Onset   Liver disease Mother    Hypertension Father    Colon cancer Neg Hx    Esophageal cancer Neg Hx    Rectal cancer Neg Hx    Stomach cancer Neg Hx    Allergies  Allergen Reactions   Sulfamethoxazole Hives   Current Outpatient Medications  Medication Sig Dispense Refill   meloxicam  (MOBIC ) 15 MG tablet Take 1 tablet (15 mg total) by mouth daily. 30 tablet 2   methocarbamol  (ROBAXIN ) 500 MG tablet Take 1 tablet (500 mg total) by mouth 2 (two) times daily. 60 tablet 1   aspirin  EC 325 MG tablet Take 1 tablet (325 mg total) by mouth daily. 14 tablet 0   benazepril  (LOTENSIN ) 20 MG tablet Take 1 tablet (20 mg total) by mouth daily. 90 tablet 1   calcium carbonate (OSCAL) 1500 (600 Ca) MG TABS tablet Take by mouth 2 (two) times daily with a meal.     cetirizine-pseudoephedrine  (ZYRTEC-D) 5-120 MG tablet Take 1 tablet by mouth daily as needed  for allergies.     cholecalciferol (VITAMIN D3) 25 MCG (1000 UNIT) tablet Take 1,000 Units by mouth daily.     FERROUS SULFATE PO Take 1 tablet by mouth daily.      meloxicam  (MOBIC ) 15 MG tablet Take 1 tablet (15 mg total) by mouth as needed for pain. 30 tablet 0   methocarbamol  (ROBAXIN ) 500 MG tablet Take 1 tablet (500 mg total) by mouth every 8 (eight) hours as needed for muscle spasms. 30 tablet 0   Multiple Vitamin (MULTIVITAMIN WITH MINERALS) TABS tablet Take 1 tablet by mouth daily.     No current facility-administered medications for this visit.   No results found.  Review of Systems:   A ROS was performed including pertinent positives and negatives as documented in the HPI.   Musculoskeletal Exam:     Right shoulder incisions are well-appearing without erythema or drainage.  Active forward elevation in the spine position is to 90 degrees with external rotation at side to 30 degrees  Imaging:      I personally reviewed and interpreted the radiographs.   Assessment:   2 weeks status post right shoulder rotator cuff repair and biceps tenodesis overall doing very well.  This time she will continue to follow the rotator cuff repair protocol and I will plan to see her back in 4 weeks for reassessment  Plan :    - Return to clinic 4 weeks for reassessment      I personally saw and evaluated the patient, and participated in the management and treatment plan.  Hailey Parker, MD Attending Physician, Orthopedic Surgery  This document was dictated using Dragon voice recognition software. A reasonable attempt at proof reading has been made to minimize errors.

## 2024-07-31 ENCOUNTER — Encounter: Payer: Self-pay | Admitting: Radiology

## 2024-07-31 ENCOUNTER — Ambulatory Visit (HOSPITAL_BASED_OUTPATIENT_CLINIC_OR_DEPARTMENT_OTHER): Attending: Orthopaedic Surgery | Admitting: Physical Therapy

## 2024-07-31 ENCOUNTER — Encounter (HOSPITAL_BASED_OUTPATIENT_CLINIC_OR_DEPARTMENT_OTHER): Payer: Self-pay | Admitting: Physical Therapy

## 2024-07-31 DIAGNOSIS — M25511 Pain in right shoulder: Secondary | ICD-10-CM | POA: Diagnosis not present

## 2024-07-31 DIAGNOSIS — M25611 Stiffness of right shoulder, not elsewhere classified: Secondary | ICD-10-CM | POA: Diagnosis not present

## 2024-07-31 DIAGNOSIS — G8929 Other chronic pain: Secondary | ICD-10-CM | POA: Insufficient documentation

## 2024-07-31 DIAGNOSIS — M6281 Muscle weakness (generalized): Secondary | ICD-10-CM | POA: Diagnosis not present

## 2024-07-31 NOTE — Therapy (Signed)
 OUTPATIENT PHYSICAL THERAPY SHOULDER TREATMENT   Patient Name: Hailey [name] MRN: [id number] DOB:19-Feb-1961, 63 y.o., female Today's Date: 07/31/2024  END OF SESSION:  PT End of Session - 07/31/24 1053     Visit Number 3    Number of Visits 24    Date for Recertification  10/10/24    Authorization Type [employer]    Progress Note Due on Visit 10    PT Start Time 1017    PT Stop Time 1058    PT Time Calculation (min) 41 min    Activity Tolerance Patient tolerated treatment well    Behavior During Therapy [facility] for tasks assessed/performed            Past Medical History:  Diagnosis Date   Allergy    Anemia    Arthritis    B12 deficiency    CARPAL TUNNEL SYNDROME, BILATERAL 08/04/2010   GERD 03/06/2008   HYPERTENSION 02/10/2007   Obesity    OBESITY NOS 02/10/2007   Sinusitis    Swelling    Vitamin D  deficiency    Past Surgical History:  Procedure Laterality Date   CARPAL TUNNEL RELEASE     Both hands   COLONOSCOPY     2012 at [facility]   HEMORRHOID SURGERY     NASAL SINUS SURGERY     TOTAL KNEE ARTHROPLASTY Left 10/16/2019   Procedure: LEFT TOTAL KNEE ARTHROPLASTY;  Surgeon: Jerri Kay HERO, MD;  Location: [facility] OR;  Service: Orthopedics;  Laterality: Left;   Patient Active Problem List   Diagnosis Date Noted   BMI 40.0-44.9, adult (HCC) 05/24/2023   Acute sinusitis 02/23/2023   Postnasal drip 02/23/2023   Osteopenia 11/24/2021   Thoracic radiculopathy 10/23/2021   Thoracic myofascial strain 10/23/2021   Viral syndrome 03/27/2021   Primary osteoarthritis of right knee 09/10/2020   At risk for activity intolerance 09/03/2020   At risk for complication associated with hypotension 07/18/2020   Vitamin D  deficiency 05/30/2020   B12 nutritional deficiency 05/30/2020   Anemia 05/30/2020   Status post total left knee replacement 10/16/2019   Primary osteoarthritis of left knee 09/06/2019   Screening for diabetes mellitus 02/24/2019   Chronic sinusitis  10/04/2018   CARPAL TUNNEL SYNDROME, BILATERAL 08/04/2010   Viral upper respiratory tract infection 10/16/2008   GERD 03/06/2008   Class 2 obesity due to excess calories without serious comorbidity with body mass index (BMI) of 38.0 to 38.9 in adult 02/10/2007   Essential hypertension 02/10/2007    PCP: Berneta Elsie Sayre, MD  REFERRING PROVIDER: Genelle Standing, MD  REFERRING DIAG: Traumatic complete tear of right rotator cuff, initial encounter [S46.011A]   THERAPY DIAG:  Muscle weakness (generalized)  Acute pain of right shoulder  Impaired range of motion of right shoulder  Chronic right shoulder pain  Rationale for Evaluation and Treatment: Rehabilitation  ONSET DATE: 07/13/24  SUBJECTIVE:  SUBJECTIVE STATEMENT: Pt is 2 weeks and 4 days post op. She states that she was reaching for a controls on her chair to recline and felt a little pull, she immediately stopped. She has a little residual pain today, but it is not bad.   Hand dominance: Left  PERTINENT HISTORY: Allergy, Anemia, B12 deficiency, Carpal tunnel syndrome, GERD, HTN, Obesity  PAIN:  Are you having pain? Yes: NPRS scale: 4/10 Pain location: R shoulder Pain description: Achy pain  Aggravating factors: Moving it Relieving factors: Rest, ice, meds  PRECAUTIONS: Shoulder  RED FLAGS: None   WEIGHT BEARING RESTRICTIONS: No  FALLS:  Has patient fallen in last 6 months? 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   NEXT MD VISIT:   OBJECTIVE:  Note: Objective measures were completed at Evaluation unless otherwise noted.  DIAGNOSTIC FINDINGS:   1. Complete full-thickness, full width tear of the supraspinatus tendon with 3.3 cm of retraction. 2. Moderate infraspinatus tendinosis. 3. Complete subscapularis tear with 3.7 cm of retraction. 4. Medial dislocation of the proximal long head of the biceps tendon with moderate tendinosis. 5. Mild osteoarthritis of the glenohumeral joint.  PATIENT SURVEYS:  Quick Dash: 100  COGNITION: Overall cognitive status: Within functional limits for tasks assessed     SENSATION: WFL  POSTURE: Rounded   UPPER EXTREMITY ROM:   Passive ROM Right eval Left eval  Shoulder flexion    Shoulder extension    Shoulder abduction    Shoulder adduction    Shoulder internal rotation    Shoulder external rotation    Elbow flexion    Elbow extension    Wrist flexion    Wrist extension    Wrist ulnar deviation    Wrist radial deviation    Wrist pronation    Wrist supination    (Blank rows = not tested)  UPPER EXTREMITY MMT:  MMT Right eval Left eval  Shoulder flexion    Shoulder extension    Shoulder abduction    Shoulder adduction    Shoulder internal rotation    Shoulder external rotation    Middle trapezius    Lower trapezius    Elbow flexion    Elbow extension    Wrist flexion    Wrist extension    Wrist ulnar deviation    Wrist radial deviation    Wrist pronation    Wrist supination    Grip strength (lbs)    (Blank rows = not tested)  JOINT MOBILITY TESTING:  Not able to assess with movement today due to surgery.   PALPATION:  Tenderness surrounding sutures and under armpit at lat insertion.  TREATMENT DATE:  07/31/24 - PROM into flexion, ER and ABD to tolerance and within protocol - Scap retraction - STM to UT and lats  - UT/ LS stretch   07/21/24: - elbow flex/ ext  - PROM into flexion, ER and ABD to tolerance and within protocol -  UT stretch  - Fixed bandages to ensure they were waterproof.  - Scap retraction - STM to UT and lats    07/18/24: Creating, reviewing, and completing below HEP  - PROM into flexion, ER and Ab to tolerance and within protocol -Scap retraction - Elbow flex/ ext - UT stretch    PATIENT EDUCATION: Education details: Educated pt on anatomy and physiology of current symptoms, DASH, diagnosis, prognosis, HEP,  and POC Person educated: Patient Education method: Explanation, Demonstration, Verbal cues, and Handouts Education comprehension: verbalized understanding and returned demonstration  HOME EXERCISE PROGRAM: Access Code: [email or web address] URL: [email or web address] Date: 07/18/2024 Prepared by: Rojean Batten  Exercises - Seated Scapular Retraction  - 1 x daily - 7 x weekly - 3 sets - 10 reps - Seated Cervical Sidebending Stretch  - 1 x daily - 7 x weekly - 3 sets - 10 reps - Seated Levator Scapulae Stretch  - 1 x daily - 7 x weekly - 3 sets - 10 reps - Seated Elbow Flexion and Extension AROM  - 1 x daily - 7 x weekly - 3 sets - 10 reps  ASSESSMENT:  CLINICAL IMPRESSION:  Pt tolerated session well. She is making great progress with PROM into flexion, abduction and ER. She continues to perform her exercises at home with no adverse effects. Pt will continue to benefit from skilled PT to address continued deficits.     Eval:Patient referred to PT for  s/p R RTC repair. Changed dressings, reviewed precautions, demonstrated and reviewed HEP with pt. She is doing well, pain is managed by rest, ice and meds. She is using her sling and having her husband help her with ADL's. No active bleeding or signs of infection. Pt tolerated PROM fairly with pain as expected.Patient will benefit from skilled PT to address below impairments, limitations and improve overall function.  OBJECTIVE IMPAIRMENTS: decreased activity tolerance, decreased shoulder mobility, decreased ROM, decreased  strength, impaired flexibility, impaired UE use, postural dysfunction, and pain.  ACTIVITY LIMITATIONS: reaching, lifting, carry,  cleaning, driving, and or occupation  PERSONAL FACTORS:  also affecting patient's functional outcome.  REHAB POTENTIAL: Good  CLINICAL DECISION MAKING: Stable/uncomplicated  EVALUATION COMPLEXITY: Low    GOALS: Short term PT Goals Target date: 08/01/24 Pt will be I and compliant with HEP. Baseline:  Goal status: New Pt will decrease pain by 25% overall Baseline: Goal status: New  Long term PT goals Target date: 10/10/24 Pt will improve Rt shoulder AROM to [facility] to improve functional reaching Baseline: Goal status: New Pt will improve  Rt shoulder strength to at least 4+/5 MMT to improve functional strength Baseline: Goal status: New Pt will improve Quick Dash to at least 60% functional to show improved function Baseline: Goal status: New Pt will reduce pain to overall less than 3/10 with usual activity and work activity. Baseline: Goal status: New  PLAN: PT FREQUENCY: 1-2  PT DURATION: 8-12 weeks  PLANNED INTERVENTIONS (unless contraindicated): aquatic PT, Canalith repositioning, cryotherapy, Electrical stimulation, Iontophoresis with 4 mg/ml dexamethasome, Moist heat, traction, Ultrasound, gait training, Therapeutic exercise, balance training, neuromuscular re-education, patient/family education, prosthetic training, manual techniques, passive ROM, dry needling, taping, vasopnuematic device, vestibular, spinal manipulations, joint manipulations  PLAN  FOR NEXT SESSION: Follow protocol.     Rojean JONELLE Batten, PT 07/31/2024, 10:54 AM

## 2024-08-07 ENCOUNTER — Encounter (HOSPITAL_BASED_OUTPATIENT_CLINIC_OR_DEPARTMENT_OTHER): Payer: Self-pay | Admitting: Physical Therapy

## 2024-08-07 ENCOUNTER — Ambulatory Visit (HOSPITAL_BASED_OUTPATIENT_CLINIC_OR_DEPARTMENT_OTHER): Admitting: Physical Therapy

## 2024-08-07 DIAGNOSIS — M25511 Pain in right shoulder: Secondary | ICD-10-CM | POA: Diagnosis not present

## 2024-08-07 DIAGNOSIS — M25611 Stiffness of right shoulder, not elsewhere classified: Secondary | ICD-10-CM

## 2024-08-07 DIAGNOSIS — M6281 Muscle weakness (generalized): Secondary | ICD-10-CM

## 2024-08-07 DIAGNOSIS — G8929 Other chronic pain: Secondary | ICD-10-CM

## 2024-08-07 NOTE — Therapy (Cosign Needed Addendum)
 OUTPATIENT PHYSICAL THERAPY SHOULDER TREATMENT   Patient Name: Hailey [name] MRN: [id number] DOB:1961/04/24, 63 y.o., female Today's Date: 08/07/2024  END OF SESSION:  PT End of Session - 08/07/24 1023     Visit Number 4    Number of Visits 24    Date for Recertification  10/10/24    Authorization Type [employer]    Progress Note Due on Visit 10    PT Start Time 1021    PT Stop Time 1056    PT Time Calculation (min) 35 min    Activity Tolerance Patient tolerated treatment well    Behavior During Therapy [facility] for tasks assessed/performed            Past Medical History:  Diagnosis Date   Allergy    Anemia    Arthritis    B12 deficiency    CARPAL TUNNEL SYNDROME, BILATERAL 08/04/2010   GERD 03/06/2008   HYPERTENSION 02/10/2007   Obesity    OBESITY NOS 02/10/2007   Sinusitis    Swelling    Vitamin D  deficiency    Past Surgical History:  Procedure Laterality Date   CARPAL TUNNEL RELEASE     Both hands   COLONOSCOPY     2012 at [facility]   HEMORRHOID SURGERY     NASAL SINUS SURGERY     TOTAL KNEE ARTHROPLASTY Left 10/16/2019   Procedure: LEFT TOTAL KNEE ARTHROPLASTY;  Surgeon: Jerri Kay HERO, MD;  Location: [facility] OR;  Service: Orthopedics;  Laterality: Left;   Patient Active Problem List   Diagnosis Date Noted   BMI 40.0-44.9, adult (HCC) 05/24/2023   Acute sinusitis 02/23/2023   Postnasal drip 02/23/2023   Osteopenia 11/24/2021   Thoracic radiculopathy 10/23/2021   Thoracic myofascial strain 10/23/2021   Viral syndrome 03/27/2021   Primary osteoarthritis of right knee 09/10/2020   At risk for activity intolerance 09/03/2020   At risk for complication associated with hypotension 07/18/2020   Vitamin D  deficiency 05/30/2020   B12 nutritional deficiency 05/30/2020   Anemia 05/30/2020   Status post total left knee replacement 10/16/2019   Primary osteoarthritis of left knee 09/06/2019   Screening for diabetes mellitus 02/24/2019   Chronic sinusitis  10/04/2018   CARPAL TUNNEL SYNDROME, BILATERAL 08/04/2010   Viral upper respiratory tract infection 10/16/2008   GERD 03/06/2008   Class 2 obesity due to excess calories without serious comorbidity with body mass index (BMI) of 38.0 to 38.9 in adult 02/10/2007   Essential hypertension 02/10/2007    PCP: Berneta Elsie Sayre, MD  REFERRING PROVIDER: Genelle Standing, MD  REFERRING DIAG: Traumatic complete tear of right rotator cuff, initial encounter [S46.011A]   THERAPY DIAG:  Impaired range of motion of right shoulder  Muscle weakness (generalized)  Acute pain of right shoulder  Chronic right shoulder pain  Rationale for Evaluation and Treatment: Rehabilitation  ONSET DATE: 07/13/24  SUBJECTIVE:  SUBJECTIVE STATEMENT: Pt is doing well today with no new complaints. 2/10 pain but reports increased stiffness in mornings.   Hand dominance: Left  PERTINENT HISTORY: Allergy, Anemia, B12 deficiency, Carpal tunnel syndrome, GERD, HTN, Obesity  PAIN:  Are you having pain? Yes: NPRS scale: 4/10 Pain location: R shoulder Pain description: Achy pain  Aggravating factors: Moving it Relieving factors: Rest, ice, meds  PRECAUTIONS: Shoulder  RED FLAGS: None   WEIGHT BEARING RESTRICTIONS: No  FALLS:  Has patient fallen in last 6 months? 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   NEXT MD VISIT:   OBJECTIVE:  Note: Objective measures were completed at Evaluation unless otherwise noted.  DIAGNOSTIC FINDINGS:  1. Complete full-thickness, full width tear of the supraspinatus tendon with 3.3 cm of retraction. 2.  Moderate infraspinatus tendinosis. 3. Complete subscapularis tear with 3.7 cm of retraction. 4. Medial dislocation of the proximal long head of the biceps tendon with moderate tendinosis. 5. Mild osteoarthritis of the glenohumeral joint.  PATIENT SURVEYS:  Quick Dash: 100  COGNITION: Overall cognitive status: Within functional limits for tasks assessed     SENSATION: WFL  POSTURE: Rounded   UPPER EXTREMITY ROM:   Passive ROM Right eval Left eval  Shoulder flexion    Shoulder extension    Shoulder abduction    Shoulder adduction    Shoulder internal rotation    Shoulder external rotation    Elbow flexion    Elbow extension    Wrist flexion    Wrist extension    Wrist ulnar deviation    Wrist radial deviation    Wrist pronation    Wrist supination    (Blank rows = not tested)  UPPER EXTREMITY MMT:  MMT Right eval Left eval  Shoulder flexion    Shoulder extension    Shoulder abduction    Shoulder adduction    Shoulder internal rotation    Shoulder external rotation    Middle trapezius    Lower trapezius    Elbow flexion    Elbow extension    Wrist flexion    Wrist extension    Wrist ulnar deviation    Wrist radial deviation    Wrist pronation    Wrist supination    Grip strength (lbs)    (Blank rows = not tested)  JOINT MOBILITY TESTING:  Not able to assess with movement today due to surgery.   PALPATION:  Tenderness surrounding sutures and under armpit at lat insertion.                                                                                                                              TREATMENT DATE:  08/07/24 Manual: STM to UT  PROM into flexion, ER, and abduction to tolerance and within protocol with distraction  Grade I inf/post GHJ glides    Pendulums multi plane with cuing for technique  Elbow flexion 2x10    07/31/24 - PROM into flexion, ER and ABD to tolerance and within protocol - Scap retraction - STM to UT and lats   - UT/ LS stretch   07/21/24: - elbow flex/ ext  - PROM into flexion, ER and ABD to tolerance and within protocol - UT stretch  - Fixed bandages to ensure they were waterproof.  - Scap retraction - STM to UT and lats    07/18/24: Creating, reviewing, and completing below HEP  - PROM into flexion, ER and Ab to tolerance and within protocol -Scap retraction - Elbow flex/ ext - UT stretch    PATIENT EDUCATION: Education details: Educated pt on anatomy and physiology of current symptoms, DASH, diagnosis, prognosis, HEP,  and POC Person educated: Patient Education method: Explanation, Demonstration, Verbal cues, and Handouts Education comprehension: verbalized understanding and returned demonstration  HOME EXERCISE PROGRAM: Access Code: [email or web address] URL: [email or web address] Date: 07/18/2024 Prepared by: Rojean Batten  Exercises - Seated Scapular Retraction  - 1 x daily - 7 x weekly - 3 sets - 10 reps - Seated Cervical Sidebending Stretch  - 1 x daily - 7 x weekly - 3 sets - 10 reps - Seated Levator Scapulae Stretch  - 1 x daily - 7 x weekly - 3 sets - 10 reps - Seated Elbow Flexion and Extension AROM  - 1 x daily - 7 x weekly - 3 sets - 10 reps  ASSESSMENT:  CLINICAL IMPRESSION:  Pt tolerated session well. Making great progress with PROM and is doing well. Demonstrated increased guarding within R UT. Utilized manual therapy to address trigger points in UT and decrease pain. Educated on [employer] protocol and HEP. Will continue to benefit from therapy to address remaining limitations. Therapy spent time discussing relaxation techniques. Her PROM is excellent at this time.   Eval:Patient referred to PT for  s/p R RTC repair. Changed dressings, reviewed precautions, demonstrated and reviewed HEP with pt. She is doing well, pain is managed by rest, ice and meds. She is using her sling and having her husband help her with ADL's. No active bleeding or signs of infection. Pt  tolerated PROM fairly with pain as expected.Patient will benefit from skilled PT to address below impairments, limitations and improve overall function.  OBJECTIVE IMPAIRMENTS: decreased activity tolerance, decreased shoulder mobility, decreased ROM, decreased strength, impaired flexibility, impaired UE use, postural dysfunction, and pain.  ACTIVITY LIMITATIONS: reaching, lifting, carry,  cleaning, driving, and or occupation  PERSONAL FACTORS:  also affecting patient's functional outcome.  REHAB POTENTIAL: Good  CLINICAL DECISION MAKING: Stable/uncomplicated  EVALUATION COMPLEXITY: Low    GOALS: Short term PT Goals Target date: 08/01/24 Pt will be I and compliant with HEP. Baseline:  Goal status: New Pt will decrease pain by 25% overall Baseline: Goal status: New  Long term PT goals Target date: 10/10/24 Pt will improve Rt shoulder AROM to [facility] to improve functional reaching Baseline: Goal status: New Pt will improve  Rt shoulder strength to at least 4+/5 MMT to improve functional strength Baseline: Goal status: New Pt will improve Quick Dash to at least 60% functional to show improved function Baseline: Goal status: New Pt will reduce pain to overall less than 3/10 with usual activity and work activity. Baseline: Goal status: New  PLAN: PT FREQUENCY: 1-2  PT DURATION: 8-12 weeks  PLANNED INTERVENTIONS (unless contraindicated): aquatic PT, Canalith repositioning, cryotherapy, Electrical stimulation,  Iontophoresis with 4 mg/ml dexamethasome, Moist heat, traction, Ultrasound, gait training, Therapeutic exercise, balance training, neuromuscular re-education, patient/family education, prosthetic training, manual techniques, passive ROM, dry needling, taping, vasopnuematic device, vestibular, spinal manipulations, joint manipulations  PLAN FOR NEXT SESSION: Follow protocol.     Lili Finder, Student-PT 08/07/2024, 10:59 AM

## 2024-08-08 ENCOUNTER — Ambulatory Visit: Admitting: Family Medicine

## 2024-08-14 ENCOUNTER — Encounter (HOSPITAL_BASED_OUTPATIENT_CLINIC_OR_DEPARTMENT_OTHER): Payer: Self-pay

## 2024-08-14 ENCOUNTER — Ambulatory Visit (HOSPITAL_BASED_OUTPATIENT_CLINIC_OR_DEPARTMENT_OTHER)

## 2024-08-14 DIAGNOSIS — M25511 Pain in right shoulder: Secondary | ICD-10-CM | POA: Diagnosis not present

## 2024-08-14 DIAGNOSIS — M25611 Stiffness of right shoulder, not elsewhere classified: Secondary | ICD-10-CM | POA: Diagnosis not present

## 2024-08-14 DIAGNOSIS — G8929 Other chronic pain: Secondary | ICD-10-CM | POA: Diagnosis not present

## 2024-08-14 DIAGNOSIS — M6281 Muscle weakness (generalized): Secondary | ICD-10-CM

## 2024-08-14 NOTE — Therapy (Signed)
 OUTPATIENT PHYSICAL THERAPY SHOULDER TREATMENT   Patient Name: Hailey [name] MRN: [id number] DOB:09/28/1961, 63 y.o., female Today's Date: 08/14/2024  END OF SESSION:  PT End of Session - 08/14/24 1014     Visit Number 5    Number of Visits 24    Date for Recertification  10/10/24    Authorization Type [employer]    Progress Note Due on Visit 10    PT Start Time 1012    PT Stop Time 1100    PT Time Calculation (min) 48 min    Activity Tolerance Patient tolerated treatment well    Behavior During Therapy [facility] for tasks assessed/performed             Past Medical History:  Diagnosis Date   Allergy    Anemia    Arthritis    B12 deficiency    CARPAL TUNNEL SYNDROME, BILATERAL 08/04/2010   GERD 03/06/2008   HYPERTENSION 02/10/2007   Obesity    OBESITY NOS 02/10/2007   Sinusitis    Swelling    Vitamin D  deficiency    Past Surgical History:  Procedure Laterality Date   CARPAL TUNNEL RELEASE     Both hands   COLONOSCOPY     2012 at [facility]   HEMORRHOID SURGERY     NASAL SINUS SURGERY     TOTAL KNEE ARTHROPLASTY Left 10/16/2019   Procedure: LEFT TOTAL KNEE ARTHROPLASTY;  Surgeon: Jerri Kay HERO, MD;  Location: [facility] OR;  Service: Orthopedics;  Laterality: Left;   Patient Active Problem List   Diagnosis Date Noted   BMI 40.0-44.9, adult (HCC) 05/24/2023   Acute sinusitis 02/23/2023   Postnasal drip 02/23/2023   Osteopenia 11/24/2021   Thoracic radiculopathy 10/23/2021   Thoracic myofascial strain 10/23/2021   Viral syndrome 03/27/2021   Primary osteoarthritis of right knee 09/10/2020   At risk for activity intolerance 09/03/2020   At risk for complication associated with hypotension 07/18/2020   Vitamin D  deficiency 05/30/2020   B12 nutritional deficiency 05/30/2020   Anemia 05/30/2020   Status post total left knee replacement 10/16/2019   Primary osteoarthritis of left knee 09/06/2019   Screening for diabetes mellitus 02/24/2019   Chronic sinusitis  10/04/2018   CARPAL TUNNEL SYNDROME, BILATERAL 08/04/2010   Viral upper respiratory tract infection 10/16/2008   GERD 03/06/2008   Class 2 obesity due to excess calories without serious comorbidity with body mass index (BMI) of 38.0 to 38.9 in adult 02/10/2007   Essential hypertension 02/10/2007    PCP: Berneta Elsie Sayre, MD  REFERRING PROVIDER: Genelle Standing, MD  REFERRING DIAG: Traumatic complete tear of right rotator cuff, initial encounter [S46.011A]   THERAPY DIAG:  Impaired range of motion of right shoulder  Muscle weakness (generalized)  Acute pain of right shoulder  Chronic right shoulder pain  Rationale for Evaluation and Treatment: Rehabilitation  ONSET DATE: 07/13/24  SUBJECTIVE:  SUBJECTIVE STATEMENT: Pt reports her neck felt better after last time, but started bothering her again after vacuuming and trying to sleep in her bed. She arrived with sling donned. Has been weaning out of it a little at home.    Hand dominance: Left  PERTINENT HISTORY: Allergy, Anemia, B12 deficiency, Carpal tunnel syndrome, GERD, HTN, Obesity  PAIN:  Are you having pain? Yes: NPRS scale: 1/10 Pain location: R shoulder Pain description: Achy pain  Aggravating factors: Moving it Relieving factors: Rest, ice, meds  PRECAUTIONS: Shoulder  RED FLAGS: None   WEIGHT BEARING RESTRICTIONS: No  FALLS:  Has patient fallen in last 6 months? 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   NEXT MD VISIT:   OBJECTIVE:  Note: Objective measures were completed at Evaluation unless otherwise noted.  DIAGNOSTIC FINDINGS:  1.  Complete full-thickness, full width tear of the supraspinatus tendon with 3.3 cm of retraction. 2. Moderate infraspinatus tendinosis. 3. Complete subscapularis tear with 3.7 cm of retraction. 4. Medial dislocation of the proximal long head of the biceps tendon with moderate tendinosis. 5. Mild osteoarthritis of the glenohumeral joint.  PATIENT SURVEYS:  Quick Dash: 100  COGNITION: Overall cognitive status: Within functional limits for tasks assessed     SENSATION: WFL  POSTURE: Rounded   UPPER EXTREMITY ROM:   Passive ROM Right eval Left eval  Shoulder flexion    Shoulder extension    Shoulder abduction    Shoulder adduction    Shoulder internal rotation    Shoulder external rotation    Elbow flexion    Elbow extension    Wrist flexion    Wrist extension    Wrist ulnar deviation    Wrist radial deviation    Wrist pronation    Wrist supination    (Blank rows = not tested)  UPPER EXTREMITY MMT:  MMT Right eval Left eval  Shoulder flexion    Shoulder extension    Shoulder abduction    Shoulder adduction    Shoulder internal rotation    Shoulder external rotation    Middle trapezius    Lower trapezius    Elbow flexion    Elbow extension    Wrist flexion    Wrist extension    Wrist ulnar deviation    Wrist radial deviation    Wrist pronation    Wrist supination    Grip strength (lbs)    (Blank rows = not tested)  JOINT MOBILITY TESTING:  Not able to assess with movement today due to surgery.   PALPATION:  Tenderness surrounding sutures and under armpit at lat insertion.  TREATMENT DATE:   08/14/24  STM to UT, lat, periscap PROM all planes (gentle) Pendulums multi plane with cuing for technique (reviewed and corrected technique) Posterior shoulder rolls x10 Seated scap retraction 2x10 UT stretching 3x30sec Supine cane  flexion 2x10 Pulleys each flexion and scaption Seated table slides abduction 2x10 HEP update and review  08/07/24 Manual: STM to UT  PROM into flexion, ER, and abduction to tolerance and within protocol with distraction  Grade I inf/post GHJ glides    Pendulums multi plane with cuing for technique  Elbow flexion 2x10    07/31/24 - PROM into flexion, ER and ABD to tolerance and within protocol - Scap retraction - STM to UT and lats  - UT/ LS stretch   07/21/24: - elbow flex/ ext  - PROM into flexion, ER and ABD to tolerance and within protocol - UT stretch  - Fixed bandages to ensure they were waterproof.  - Scap retraction - STM to UT and lats    07/18/24: Creating, reviewing, and completing below HEP  - PROM into flexion, ER and Ab to tolerance and within protocol -Scap retraction - Elbow flex/ ext - UT stretch    PATIENT EDUCATION: Education details: Educated pt on anatomy and physiology of current symptoms, DASH, diagnosis, prognosis, HEP,  and POC Person educated: Patient Education method: Explanation, Demonstration, Verbal cues, and Handouts Education comprehension: verbalized understanding and returned demonstration  HOME EXERCISE PROGRAM: Access Code: [email or web address] URL: [email or web address] Date: 07/18/2024 Prepared by: Rojean Batten  Exercises - Seated Scapular Retraction  - 1 x daily - 7 x weekly - 3 sets - 10 reps - Seated Cervical Sidebending Stretch  - 1 x daily - 7 x weekly - 3 sets - 10 reps - Seated Levator Scapulae Stretch  - 1 x daily - 7 x weekly - 3 sets - 10 reps - Seated Elbow Flexion and Extension AROM  - 1 x daily - 7 x weekly - 3 sets - 10 reps  ASSESSMENT:  CLINICAL IMPRESSION:  Patient is 4-1/2 weeks S/P.  Patient progressing well with passive range of motion.  Able to progress gently with AAROM interventions with good tolerance.  Added supine cane flexion and seated lateral table slides to HEP.  Spent time on STM to  right Lat, periscap and UT mm to decrease restrictions and musculature.  Discussed precautions and limitations at this time with verbalized understanding.  Instructed patient to utilize ice at home following to be sessions to prevent/decrease soreness. Will continue to progress as tolerated.  Eval:Patient referred to PT for  s/p R RTC repair. Changed dressings, reviewed precautions, demonstrated and reviewed HEP with pt. She is doing well, pain is managed by rest, ice and meds. She is using her sling and having her husband help her with ADL's. No active bleeding or signs of infection. Pt tolerated PROM fairly with pain as expected.Patient will benefit from skilled PT to address below impairments, limitations and improve overall function.  OBJECTIVE IMPAIRMENTS: decreased activity tolerance, decreased shoulder mobility, decreased ROM, decreased strength, impaired flexibility, impaired UE use, postural dysfunction, and pain.  ACTIVITY LIMITATIONS: reaching, lifting, carry,  cleaning, driving, and or occupation  PERSONAL FACTORS:  also affecting patient's functional outcome.  REHAB POTENTIAL: Good  CLINICAL DECISION MAKING: Stable/uncomplicated  EVALUATION COMPLEXITY: Low    GOALS: Short term PT Goals Target date: 08/01/24 Pt will be I and compliant with HEP. Baseline:  Goal status: New Pt will decrease pain by 25% overall Baseline: Goal status: New  Long term PT goals Target date: 10/10/24 Pt will improve Rt shoulder AROM to [facility] to improve functional reaching Baseline: Goal status: New Pt will improve  Rt shoulder strength to at least 4+/5 MMT to improve functional strength Baseline: Goal status: New Pt will improve Quick Dash to at least 60% functional to show improved function Baseline: Goal status: New Pt will reduce pain to overall less than 3/10 with usual activity and work activity. Baseline: Goal status: New  PLAN: PT FREQUENCY: 1-2  PT DURATION: 8-12 weeks  PLANNED  INTERVENTIONS (unless contraindicated): aquatic PT, Canalith repositioning, cryotherapy, Electrical stimulation, Iontophoresis with 4 mg/ml dexamethasome, Moist heat, traction, Ultrasound, gait training, Therapeutic exercise, balance training, neuromuscular re-education, patient/family education, prosthetic training, manual techniques, passive ROM, dry needling, taping, vasopnuematic device, vestibular, spinal manipulations, joint manipulations  PLAN FOR NEXT SESSION: Follow protocol.     [place]berry BRAVO Tanith Dagostino, PTA 08/14/2024, 11:12 AM

## 2024-08-17 ENCOUNTER — Ambulatory Visit (HOSPITAL_BASED_OUTPATIENT_CLINIC_OR_DEPARTMENT_OTHER): Admitting: Physical Therapy

## 2024-08-17 ENCOUNTER — Encounter: Payer: Self-pay | Admitting: Family Medicine

## 2024-08-17 DIAGNOSIS — M25511 Pain in right shoulder: Secondary | ICD-10-CM

## 2024-08-17 DIAGNOSIS — M25611 Stiffness of right shoulder, not elsewhere classified: Secondary | ICD-10-CM | POA: Diagnosis not present

## 2024-08-17 DIAGNOSIS — G8929 Other chronic pain: Secondary | ICD-10-CM | POA: Diagnosis not present

## 2024-08-17 DIAGNOSIS — M6281 Muscle weakness (generalized): Secondary | ICD-10-CM

## 2024-08-17 NOTE — Therapy (Incomplete)
 OUTPATIENT PHYSICAL THERAPY SHOULDER TREATMENT   Patient Name: Hailey [name] MRN: [id number] DOB:1961/09/22, 63 y.o., female Today's Date: 08/17/2024  END OF SESSION:       Past Medical History:  Diagnosis Date   Allergy    Anemia    Arthritis    B12 deficiency    CARPAL TUNNEL SYNDROME, BILATERAL 08/04/2010   GERD 03/06/2008   HYPERTENSION 02/10/2007   Obesity    OBESITY NOS 02/10/2007   Sinusitis    Swelling    Vitamin D  deficiency    Past Surgical History:  Procedure Laterality Date   CARPAL TUNNEL RELEASE     Both hands   COLONOSCOPY     2012 at [facility]   HEMORRHOID SURGERY     NASAL SINUS SURGERY     TOTAL KNEE ARTHROPLASTY Left 10/16/2019   Procedure: LEFT TOTAL KNEE ARTHROPLASTY;  Surgeon: Jerri Kay HERO, MD;  Location: [facility] OR;  Service: Orthopedics;  Laterality: Left;   Patient Active Problem List   Diagnosis Date Noted   BMI 40.0-44.9, adult (HCC) 05/24/2023   Acute sinusitis 02/23/2023   Postnasal drip 02/23/2023   Osteopenia 11/24/2021   Thoracic radiculopathy 10/23/2021   Thoracic myofascial strain 10/23/2021   Viral syndrome 03/27/2021   Primary osteoarthritis of right knee 09/10/2020   At risk for activity intolerance 09/03/2020   At risk for complication associated with hypotension 07/18/2020   Vitamin D  deficiency 05/30/2020   B12 nutritional deficiency 05/30/2020   Anemia 05/30/2020   Status post total left knee replacement 10/16/2019   Primary osteoarthritis of left knee 09/06/2019   Screening for diabetes mellitus 02/24/2019   Chronic sinusitis 10/04/2018   CARPAL TUNNEL SYNDROME, BILATERAL 08/04/2010   Viral upper respiratory tract infection 10/16/2008   GERD 03/06/2008   Class 2 obesity due to excess calories without serious comorbidity with body mass index (BMI) of 38.0 to 38.9 in adult 02/10/2007   Essential hypertension 02/10/2007    PCP: Berneta Elsie Sayre, MD  REFERRING PROVIDER: Genelle Standing, MD  REFERRING DIAG:  Traumatic complete tear of right rotator cuff, initial encounter [S46.011A]   THERAPY DIAG:  No diagnosis found.  Rationale for Evaluation and Treatment: Rehabilitation  ONSET DATE: 07/13/24  SUBJECTIVE:                                                                                                                                                                                      SUBJECTIVE STATEMENT: Pt reports her neck felt better after last time, but started bothering her again after vacuuming and trying to sleep in her bed. She arrived with sling donned. Has been weaning out of it a little  at home.    Hand dominance: Left  PERTINENT HISTORY: Allergy, Anemia, B12 deficiency, Carpal tunnel syndrome, GERD, HTN, Obesity  PAIN:  Are you having pain? Yes: NPRS scale: 1/10 Pain location: R shoulder Pain description: Achy pain  Aggravating factors: Moving it Relieving factors: Rest, ice, meds  PRECAUTIONS: Shoulder  RED FLAGS: None   WEIGHT BEARING RESTRICTIONS: No  FALLS:  Has patient fallen in last 6 months? 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   NEXT MD VISIT:   OBJECTIVE:  Note: Objective measures were completed at Evaluation unless otherwise noted.  DIAGNOSTIC FINDINGS:  1. Complete full-thickness, full width tear of the supraspinatus tendon with 3.3 cm of retraction. 2. Moderate infraspinatus tendinosis. 3. Complete subscapularis tear with 3.7 cm of retraction. 4. Medial dislocation of the proximal long head of the biceps tendon with moderate tendinosis. 5. Mild osteoarthritis of the glenohumeral joint.  PATIENT SURVEYS:  Quick Dash: 100  COGNITION: Overall cognitive status: Within functional limits for tasks assessed     SENSATION: WFL  POSTURE: Rounded   UPPER  EXTREMITY ROM:   Passive ROM Right eval Left eval  Shoulder flexion    Shoulder extension    Shoulder abduction    Shoulder adduction    Shoulder internal rotation    Shoulder external rotation    Elbow flexion    Elbow extension    Wrist flexion    Wrist extension    Wrist ulnar deviation    Wrist radial deviation    Wrist pronation    Wrist supination    (Blank rows = not tested)  UPPER EXTREMITY MMT:  MMT Right eval Left eval  Shoulder flexion    Shoulder extension    Shoulder abduction    Shoulder adduction    Shoulder internal rotation    Shoulder external rotation    Middle trapezius    Lower trapezius    Elbow flexion    Elbow extension    Wrist flexion    Wrist extension    Wrist ulnar deviation    Wrist radial deviation    Wrist pronation    Wrist supination    Grip strength (lbs)    (Blank rows = not tested)  JOINT MOBILITY TESTING:  Not able to assess with movement today due to surgery.   PALPATION:  Tenderness surrounding sutures and under armpit at lat insertion.                                                                                                                              TREATMENT DATE:  11/20 PROM into flexion, ER and ABD to tolerance and within protocol with distraction  - STM to UT and lats  - UT/ LS stretch   08/14/24  STM to UT, lat, periscap  PROM all planes (gentle) Pendulums multi plane with cuing for technique (reviewed and corrected technique) Posterior shoulder rolls x10 Seated scap retraction 2x10 UT stretching 3x30sec Supine cane flexion 2x10 Pulleys each flexion and scaption Seated table slides abduction 2x10 HEP update and review  08/07/24 Manual: STM to UT  PROM into flexion, ER, and abduction to tolerance and within protocol with distraction  Grade I inf/post GHJ glides    Pendulums multi plane with cuing for technique  Elbow flexion 2x10    07/31/24 - PROM into flexion, ER and ABD to  tolerance and within protocol - Scap retraction - STM to UT and lats  - UT/ LS stretch   07/21/24: - elbow flex/ ext  - PROM into flexion, ER and ABD to tolerance and within protocol - UT stretch  - Fixed bandages to ensure they were waterproof.  - Scap retraction - STM to UT and lats    07/18/24: Creating, reviewing, and completing below HEP  - PROM into flexion, ER and Ab to tolerance and within protocol -Scap retraction - Elbow flex/ ext - UT stretch    PATIENT EDUCATION: Education details: Educated pt on anatomy and physiology of current symptoms, DASH, diagnosis, prognosis, HEP,  and POC Person educated: Patient Education method: Explanation, Demonstration, Verbal cues, and Handouts Education comprehension: verbalized understanding and returned demonstration  HOME EXERCISE PROGRAM: Access Code: [email or web address] URL: [email or web address] Date: 07/18/2024 Prepared by: Rojean Batten  Exercises - Seated Scapular Retraction  - 1 x daily - 7 x weekly - 3 sets - 10 reps - Seated Cervical Sidebending Stretch  - 1 x daily - 7 x weekly - 3 sets - 10 reps - Seated Levator Scapulae Stretch  - 1 x daily - 7 x weekly - 3 sets - 10 reps - Seated Elbow Flexion and Extension AROM  - 1 x daily - 7 x weekly - 3 sets - 10 reps  ASSESSMENT:  CLINICAL IMPRESSION:     Eval:Patient referred to PT for  s/p R RTC repair. Changed dressings, reviewed precautions, demonstrated and reviewed HEP with pt. She is doing well, pain is managed by rest, ice and meds. She is using her sling and having her husband help her with ADL's. No active bleeding or signs of infection. Pt tolerated PROM fairly with pain as expected.Patient will benefit from skilled PT to address below impairments, limitations and improve overall function.  OBJECTIVE IMPAIRMENTS: decreased activity tolerance, decreased shoulder mobility, decreased ROM, decreased strength, impaired flexibility, impaired UE use, postural  dysfunction, and pain.  ACTIVITY LIMITATIONS: reaching, lifting, carry,  cleaning, driving, and or occupation  PERSONAL FACTORS:  also affecting patient's functional outcome.  REHAB POTENTIAL: Good  CLINICAL DECISION MAKING: Stable/uncomplicated  EVALUATION COMPLEXITY: Low    GOALS: Short term PT Goals Target date: 08/01/24 Pt will be I and compliant with HEP. Baseline:  Goal status: New Pt will decrease pain by 25% overall Baseline: Goal status: New  Long term PT goals Target date: 10/10/24 Pt will improve Rt shoulder AROM to [facility] to improve functional reaching Baseline: Goal status: New Pt will improve  Rt shoulder strength to at least 4+/5 MMT to improve functional strength Baseline: Goal status: New Pt will improve Quick Dash to at least 60% functional to show improved function Baseline: Goal status: New Pt will reduce pain to overall less than 3/10 with usual activity and work activity. Baseline: Goal status: New  PLAN: PT FREQUENCY: 1-2  PT DURATION: 8-12 weeks  PLANNED INTERVENTIONS (unless contraindicated):  aquatic PT, Canalith repositioning, cryotherapy, Electrical stimulation, Iontophoresis with 4 mg/ml dexamethasome, Moist heat, traction, Ultrasound, gait training, Therapeutic exercise, balance training, neuromuscular re-education, patient/family education, prosthetic training, manual techniques, passive ROM, dry needling, taping, vasopnuematic device, vestibular, spinal manipulations, joint manipulations  PLAN FOR NEXT SESSION: Follow protocol.     Alm JINNY Don, PT 08/17/2024, 10:20 AM

## 2024-08-17 NOTE — Therapy (Signed)
 OUTPATIENT PHYSICAL THERAPY SHOULDER TREATMENT   Patient Name: Hailey [name] MRN: [id number] DOB:[birth year], 63 y.o., female Today's Date: 08/18/2024  END OF SESSION:       Past Medical History:  Diagnosis Date   Allergy    Anemia    Arthritis    B12 deficiency    CARPAL TUNNEL SYNDROME, BILATERAL 08/04/2010   GERD 03/06/2008   HYPERTENSION 02/10/2007   Obesity    OBESITY NOS 02/10/2007   Sinusitis    Swelling    Vitamin D  deficiency    Past Surgical History:  Procedure Laterality Date   CARPAL TUNNEL RELEASE     Both hands   COLONOSCOPY     2012 at [facility]   HEMORRHOID SURGERY     NASAL SINUS SURGERY     TOTAL KNEE ARTHROPLASTY Left 10/16/2019   Procedure: LEFT TOTAL KNEE ARTHROPLASTY;  Surgeon: Jerri Kay HERO, MD;  Location: [facility] OR;  Service: Orthopedics;  Laterality: Left;   Patient Active Problem List   Diagnosis Date Noted   BMI 40.0-44.9, adult (HCC) 05/24/2023   Acute sinusitis 02/23/2023   Postnasal drip 02/23/2023   Osteopenia 11/24/2021   Thoracic radiculopathy 10/23/2021   Thoracic myofascial strain 10/23/2021   Viral syndrome 03/27/2021   Primary osteoarthritis of right knee 09/10/2020   At risk for activity intolerance 09/03/2020   At risk for complication associated with hypotension 07/18/2020   Vitamin D  deficiency 05/30/2020   B12 nutritional deficiency 05/30/2020   Anemia 05/30/2020   Status post total left knee replacement 10/16/2019   Primary osteoarthritis of left knee 09/06/2019   Screening for diabetes mellitus 02/24/2019   Chronic sinusitis 10/04/2018   CARPAL TUNNEL SYNDROME, BILATERAL 08/04/2010   Viral upper respiratory tract infection 10/16/2008   GERD 03/06/2008   Class 2 obesity due to excess calories without serious comorbidity with body mass index (BMI) of 38.0 to 38.9 in adult 02/10/2007   Essential hypertension 02/10/2007    PCP: Berneta Elsie Sayre, MD  REFERRING PROVIDER: Genelle Standing, MD  REFERRING DIAG:  Traumatic complete tear of right rotator cuff, initial encounter [S46.011A]   THERAPY DIAG:  No diagnosis found.  Rationale for Evaluation and Treatment: Rehabilitation  ONSET DATE: 07/13/24  SUBJECTIVE:                                                                                                                                                                                      SUBJECTIVE STATEMENT: The patient has increased pain with her wand exercises. She also tried to go without tylenol  last night. She is feeling sore.  Hand dominance: Left  PERTINENT HISTORY: Allergy, Anemia, B12 deficiency, Carpal tunnel syndrome,  GERD, HTN, Obesity  PAIN:  Are you having pain? Yes: NPRS scale: 4-5/10 Pain location: R shoulder Pain description: Achy pain  Aggravating factors: Moving it Relieving factors: Rest, ice, meds  PRECAUTIONS: Shoulder  RED FLAGS: None   WEIGHT BEARING RESTRICTIONS: No  FALLS:  Has patient fallen in last 6 months? 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   NEXT MD VISIT:   OBJECTIVE:  Note: Objective measures were completed at Evaluation unless otherwise noted.  DIAGNOSTIC FINDINGS:  1. Complete full-thickness, full width tear of the supraspinatus tendon with 3.3 cm of retraction. 2. Moderate infraspinatus tendinosis. 3. Complete subscapularis tear with 3.7 cm of retraction. 4. Medial dislocation of the proximal long head of the biceps tendon with moderate tendinosis. 5. Mild osteoarthritis of the glenohumeral joint.  PATIENT SURVEYS:  Quick Dash: 100  COGNITION: Overall cognitive status: Within functional limits for tasks assessed     SENSATION: WFL  POSTURE: Rounded   UPPER EXTREMITY ROM:   Passive ROM Right eval Left eval  Shoulder flexion    Shoulder  extension    Shoulder abduction    Shoulder adduction    Shoulder internal rotation    Shoulder external rotation    Elbow flexion    Elbow extension    Wrist flexion    Wrist extension    Wrist ulnar deviation    Wrist radial deviation    Wrist pronation    Wrist supination    (Blank rows = not tested)  UPPER EXTREMITY MMT:  MMT Right eval Left eval  Shoulder flexion    Shoulder extension    Shoulder abduction    Shoulder adduction    Shoulder internal rotation    Shoulder external rotation    Middle trapezius    Lower trapezius    Elbow flexion    Elbow extension    Wrist flexion    Wrist extension    Wrist ulnar deviation    Wrist radial deviation    Wrist pronation    Wrist supination    Grip strength (lbs)    (Blank rows = not tested)  JOINT MOBILITY TESTING:  Not able to assess with movement today due to surgery.   PALPATION:  Tenderness surrounding sutures and under armpit at lat insertion.                                                                                                                              TREATMENT DATE:  11/20  Manual: STM to UT  PROM into flexion, ER, and abduction to tolerance and within protocol with distraction  Grade I inf/post GHJ glides   There-ex: Self Elbow flexion 2x10  Scap retraction 3x10  Table slidein flexion 2x10  [place] x15 fwd and x15 scaption   08/14/24  STM to UT, lat, periscap PROM all planes (gentle) Pendulums multi plane with cuing for technique (reviewed and corrected technique) Posterior shoulder rolls x10 Seated scap retraction 2x10 UT stretching 3x30sec Supine cane flexion 2x10 Pulleys each flexion and scaption Seated table slides abduction 2x10 HEP update and review  08/07/24 Manual: STM to UT  PROM into flexion, ER, and abduction to tolerance and within protocol with distraction  Grade I inf/post GHJ glides    Pendulums multi plane with cuing for technique  Elbow flexion  2x10    07/31/24 - PROM into flexion, ER and ABD to tolerance and within protocol - Scap retraction - STM to UT and lats  - UT/ LS stretch   07/21/24: - elbow flex/ ext  - PROM into flexion, ER and ABD to tolerance and within protocol - UT stretch  - Fixed bandages to ensure they were waterproof.  - Scap retraction - STM to UT and lats    07/18/24: Creating, reviewing, and completing below HEP  - PROM into flexion, ER and Ab to tolerance and within protocol -Scap retraction - Elbow flex/ ext - UT stretch    PATIENT EDUCATION: Education details: Educated pt on anatomy and physiology of current symptoms, DASH, diagnosis, prognosis, HEP,  and POC Person educated: Patient Education method: Explanation, Demonstration, Verbal cues, and Handouts Education comprehension: verbalized understanding and returned demonstration  HOME EXERCISE PROGRAM: Access Code: [email or web address] URL: [email or web address] Date: 07/18/2024 Prepared by: Rojean Batten  Exercises - Seated Scapular Retraction  - 1 x daily - 7 x weekly - 3 sets - 10 reps - Seated Cervical Sidebending Stretch  - 1 x daily - 7 x weekly - 3 sets - 10 reps - Seated Levator Scapulae Stretch  - 1 x daily - 7 x weekly - 3 sets - 10 reps - Seated Elbow Flexion and Extension AROM  - 1 x daily - 7 x weekly - 3 sets - 10 reps  ASSESSMENT:  CLINICAL IMPRESSION:  The patient had some pain with AAROM last visit so she was avoided to hold for now. Her motion is still excellent. We focused more on manual therapy. She had spasming in her upper trap today. We encouraged her to continue using her sling. We reviewed the exercises we want her to do over the weekend. Therapy will continue to progress as tolerated.  Eval:Patient referred to PT for  s/p R RTC repair. Changed dressings, reviewed precautions, demonstrated and reviewed HEP with pt. She is doing well, pain is managed by rest, ice and meds. She is using her sling and having  her husband help her with ADL's. No active bleeding or signs of infection. Pt tolerated PROM fairly with pain as expected.Patient will benefit from skilled PT to address below impairments, limitations and improve overall function.  OBJECTIVE IMPAIRMENTS: decreased activity tolerance, decreased shoulder mobility, decreased ROM, decreased strength, impaired flexibility, impaired UE use, postural dysfunction, and pain.  ACTIVITY LIMITATIONS: reaching, lifting, carry,  cleaning, driving, and or occupation  PERSONAL FACTORS:  also affecting patient's functional outcome.  REHAB POTENTIAL: Good  CLINICAL DECISION MAKING: Stable/uncomplicated  EVALUATION COMPLEXITY: Low    GOALS: Short term PT Goals Target date: 08/01/24 Pt will be I and compliant with HEP. Baseline:  Goal status: New Pt will decrease pain by 25% overall Baseline: Goal status: New  Long term PT goals Target date: 10/10/24 Pt will improve Rt shoulder AROM to [facility] to improve functional reaching Baseline: Goal status: New Pt will improve  Rt shoulder  strength to at least 4+/5 MMT to improve functional strength Baseline: Goal status: New Pt will improve Quick Dash to at least 60% functional to show improved function Baseline: Goal status: New Pt will reduce pain to overall less than 3/10 with usual activity and work activity. Baseline: Goal status: New  PLAN: PT FREQUENCY: 1-2  PT DURATION: 8-12 weeks  PLANNED INTERVENTIONS (unless contraindicated): aquatic PT, Canalith repositioning, cryotherapy, Electrical stimulation, Iontophoresis with 4 mg/ml dexamethasome, Moist heat, traction, Ultrasound, gait training, Therapeutic exercise, balance training, neuromuscular re-education, patient/family education, prosthetic training, manual techniques, passive ROM, dry needling, taping, vasopnuematic device, vestibular, spinal manipulations, joint manipulations  PLAN FOR NEXT SESSION: Follow protocol.     Alm JINNY Don,  PT 08/18/2024, 9:52 AM

## 2024-08-21 ENCOUNTER — Encounter: Payer: Self-pay | Admitting: Emergency Medicine

## 2024-08-21 ENCOUNTER — Other Ambulatory Visit (HOSPITAL_COMMUNITY): Payer: Self-pay

## 2024-08-21 ENCOUNTER — Ambulatory Visit (INDEPENDENT_AMBULATORY_CARE_PROVIDER_SITE_OTHER): Admitting: Emergency Medicine

## 2024-08-21 VITALS — BP 132/82 | HR 85 | Resp 18 | Ht 64.0 in | Wt 257.0 lb

## 2024-08-21 DIAGNOSIS — Z1151 Encounter for screening for human papillomavirus (HPV): Secondary | ICD-10-CM | POA: Diagnosis not present

## 2024-08-21 DIAGNOSIS — E66813 Obesity, class 3: Secondary | ICD-10-CM | POA: Diagnosis not present

## 2024-08-21 DIAGNOSIS — Z6841 Body Mass Index (BMI) 40.0 and over, adult: Secondary | ICD-10-CM | POA: Diagnosis not present

## 2024-08-21 DIAGNOSIS — J329 Chronic sinusitis, unspecified: Secondary | ICD-10-CM

## 2024-08-21 DIAGNOSIS — Z1231 Encounter for screening mammogram for malignant neoplasm of breast: Secondary | ICD-10-CM | POA: Diagnosis not present

## 2024-08-21 DIAGNOSIS — Z01419 Encounter for gynecological examination (general) (routine) without abnormal findings: Secondary | ICD-10-CM | POA: Diagnosis not present

## 2024-08-21 DIAGNOSIS — Z124 Encounter for screening for malignant neoplasm of cervix: Secondary | ICD-10-CM | POA: Diagnosis not present

## 2024-08-21 MED ORDER — METHYLPREDNISOLONE 4 MG PO TBPK
ORAL_TABLET | ORAL | 0 refills | Status: AC
  Filled 2024-08-21: qty 21, 6d supply, fill #0

## 2024-08-21 NOTE — Progress Notes (Signed)
 Assessment & Plan:   Assessment & Plan Recurrent sinus infections Doubt ABRS at this time Rx as below ENT ref Cons abx if no improvement  Orders:   Ambulatory referral to ENT   methylPREDNISolone  (MEDROL  DOSEPAK) 4 MG TBPK tablet; Take as directed on foil pack      No results found for any visits on 08/21/24.  Patient Instructions  Nasal saline gel, (AYR) can help moisturize in the nose Steroid pack for inflammation If not improving in the next 72 house, send me a MyChart message.    Corean Geralds, MSPAS, PA-C   Subjective:  HPI: Hailey [name] is a 63 y.o. female with history of  has a past medical history of Allergy (1982), Anemia, Arthritis (2018), B12 deficiency, CARPAL TUNNEL SYNDROME, BILATERAL (08/04/2010), GERD (03/06/2008), HYPERTENSION (02/10/2007), Obesity, OBESITY NOS (02/10/2007), Sinusitis, Swelling, and Vitamin D  deficiency. presenting on 08/21/2024 for Headache (Wakes up everyday with a sinus headache. Using Zyrtec D and BC. Feels fluid in ears.  ) Hx includes sinus operations  Feels right now like there is inflammation, but not infection.  Few weeks of current sx. Occ nosebleed. Daily sinus headache, has to take [facility] in addition to zyrtec-d Would like to go back to ENT    ROS: Negative unless specifically indicated above in HPI.   Relevant past medical history reviewed and updated as indicated.  S/p shoulder surgery 10/16 Allergies and medications reviewed and updated.   Current Outpatient Medications:    aspirin  EC 325 MG tablet, Take 1 tablet (325 mg total) by mouth daily., Disp: 14 tablet, Rfl: 0   benazepril  (LOTENSIN ) 20 MG tablet, Take 1 tablet (20 mg total) by mouth daily., Disp: 90 tablet, Rfl: 1   calcium carbonate (OSCAL) 1500 (600 Ca) MG TABS tablet, Take by mouth 2 (two) times daily with a meal., Disp: , Rfl:    cetirizine-pseudoephedrine  (ZYRTEC-D) 5-120 MG tablet, Take 1 tablet by mouth daily as needed for allergies., Disp: ,  Rfl:    cholecalciferol (VITAMIN D3) 25 MCG (1000 UNIT) tablet, Take 1,000 Units by mouth daily., Disp: , Rfl:    FERROUS SULFATE PO, Take 1 tablet by mouth daily. , Disp: , Rfl:    methocarbamol  (ROBAXIN ) 500 MG tablet, Take 1 tablet (500 mg total) by mouth every 8 (eight) hours as needed for muscle spasms., Disp: 30 tablet, Rfl: 0   methocarbamol  (ROBAXIN ) 500 MG tablet, Take 1 tablet (500 mg total) by mouth 2 (two) times daily., Disp: 60 tablet, Rfl: 1   methylPREDNISolone  (MEDROL  DOSEPAK) 4 MG TBPK tablet, Take as directed on foil pack, Disp: 21 each, Rfl: 0   Multiple Vitamin (MULTIVITAMIN WITH MINERALS) TABS tablet, Take 1 tablet by mouth daily., Disp: , Rfl:   Allergies  Allergen Reactions   Other Other (See Comments)   Sulfamethoxazole Hives and Dermatitis    sulfamethoxazole      Objective:   Vitals:   08/21/24 1454  BP: 132/82  Pulse: 85  Resp: 18  Height: 5' 4 (1.626 m)  Weight: 257 lb (116.6 kg)  SpO2: 95%  BMI (Calculated): 44.09      Gen: appears well, alert, INAD Ears: Right canal normal. Right TM retracted. Left canal normal. Left TM retracted.  Nose: normal and patent, no erythema, discharge or polyps, mucosal congestion, mucosal pallor, and sinus tenderness noted bilateral maxillary Mouth: Oral mucosa moist. Throat: cobblestoned  Neck: supple and no adenopathy Heart RRR Lungs: Respiratory effort: normal.  clear to auscultation, no wheezes, rales,  or rhonchi Skin: Warm and dry without acute rash to exposed areas.

## 2024-08-21 NOTE — Patient Instructions (Addendum)
 Nasal saline gel, (AYR) can help moisturize in the nose Steroid pack for inflammation If not improving in the next 72 house, send me a MyChart message.

## 2024-08-22 ENCOUNTER — Ambulatory Visit (HOSPITAL_BASED_OUTPATIENT_CLINIC_OR_DEPARTMENT_OTHER): Admitting: Physical Therapy

## 2024-08-22 ENCOUNTER — Encounter (HOSPITAL_BASED_OUTPATIENT_CLINIC_OR_DEPARTMENT_OTHER): Payer: Self-pay | Admitting: Physical Therapy

## 2024-08-22 DIAGNOSIS — G8929 Other chronic pain: Secondary | ICD-10-CM | POA: Diagnosis not present

## 2024-08-22 DIAGNOSIS — M25611 Stiffness of right shoulder, not elsewhere classified: Secondary | ICD-10-CM | POA: Diagnosis not present

## 2024-08-22 DIAGNOSIS — M6281 Muscle weakness (generalized): Secondary | ICD-10-CM

## 2024-08-22 DIAGNOSIS — M25511 Pain in right shoulder: Secondary | ICD-10-CM | POA: Diagnosis not present

## 2024-08-22 NOTE — Therapy (Unsigned)
 OUTPATIENT PHYSICAL THERAPY SHOULDER TREATMENT   Patient Name: MING KUNKA MRN: [id number] DOB:1961-04-05, 63 y.o., female Today's Date: 08/23/2024  END OF SESSION:  PT End of Session - 08/22/24 1039     Visit Number 7    Number of Visits 24    Date for Recertification  10/10/24    Authorization Type [employer]    PT Start Time 1018    PT Stop Time 1100    PT Time Calculation (min) 42 min              Past Medical History:  Diagnosis Date   Allergy 1982   Anemia    Arthritis 2018   B12 deficiency    CARPAL TUNNEL SYNDROME, BILATERAL 08/04/2010   GERD 03/06/2008   HYPERTENSION 02/10/2007   Obesity    OBESITY NOS 02/10/2007   Sinusitis    Swelling    Vitamin D  deficiency    Past Surgical History:  Procedure Laterality Date   CARPAL TUNNEL RELEASE     Both hands   COLONOSCOPY     2012 at [facility] SURGERY     HERNIA REPAIR  2006   JOINT REPLACEMENT  09/2018   NASAL SINUS SURGERY     TOTAL KNEE ARTHROPLASTY Left 10/16/2019   Procedure: LEFT TOTAL KNEE ARTHROPLASTY;  Surgeon: Jerri Kay HERO, MD;  Location: [facility] OR;  Service: Orthopedics;  Laterality: Left;   Patient Active Problem List   Diagnosis Date Noted   BMI 40.0-44.9, adult (HCC) 05/24/2023   Acute sinusitis 02/23/2023   Postnasal drip 02/23/2023   Osteopenia 11/24/2021   Thoracic radiculopathy 10/23/2021   Thoracic myofascial strain 10/23/2021   Viral syndrome 03/27/2021   Primary osteoarthritis of right knee 09/10/2020   At risk for activity intolerance 09/03/2020   At risk for complication associated with hypotension 07/18/2020   Vitamin D  deficiency 05/30/2020   B12 nutritional deficiency 05/30/2020   Anemia 05/30/2020   Status post total left knee replacement 10/16/2019   Primary osteoarthritis of left knee 09/06/2019   Screening for diabetes mellitus 02/24/2019   Chronic sinusitis 10/04/2018   CARPAL TUNNEL SYNDROME, BILATERAL 08/04/2010   Viral upper respiratory  tract infection 10/16/2008   GERD 03/06/2008   Class 2 obesity due to excess calories without serious comorbidity with body mass index (BMI) of 38.0 to 38.9 in adult 02/10/2007   Essential hypertension 02/10/2007    PCP: Berneta Elsie Sayre, MD  REFERRING PROVIDER: Genelle Standing, MD  REFERRING DIAG: Traumatic complete tear of right rotator cuff, initial encounter [S46.011A]   THERAPY DIAG:  Muscle weakness (generalized)  Acute pain of right shoulder  Rationale for Evaluation and Treatment: Rehabilitation  ONSET DATE: 07/13/24  SUBJECTIVE:  SUBJECTIVE STATEMENT: The patient has increased pain with her wand exercises. She also tried to go without tylenol  last night. She is feeling sore.  Hand dominance: Left  PERTINENT HISTORY: Allergy, Anemia, B12 deficiency, Carpal tunnel syndrome, GERD, HTN, Obesity  PAIN:  Are you having pain? Yes: NPRS scale: 4-5/10 Pain location: R shoulder Pain description: Achy pain  Aggravating factors: Moving it Relieving factors: Rest, ice, meds  PRECAUTIONS: Shoulder  RED FLAGS: None   WEIGHT BEARING RESTRICTIONS: No  FALLS:  Has patient fallen in last 6 months? 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   NEXT MD VISIT:   OBJECTIVE:  Note: Objective measures were completed at Evaluation unless otherwise noted.  DIAGNOSTIC FINDINGS:  1. Complete full-thickness, full width tear of the supraspinatus tendon with 3.3 cm of retraction. 2. Moderate infraspinatus tendinosis. 3. Complete subscapularis tear with 3.7 cm of retraction. 4. Medial dislocation of the proximal long head of  the biceps tendon with moderate tendinosis. 5. Mild osteoarthritis of the glenohumeral joint.  PATIENT SURVEYS:  Quick Dash: 100  COGNITION: Overall cognitive status: Within functional limits for tasks assessed     SENSATION: WFL  POSTURE: Rounded   UPPER EXTREMITY ROM:   Passive ROM Right eval Left eval  Shoulder flexion    Shoulder extension    Shoulder abduction    Shoulder adduction    Shoulder internal rotation    Shoulder external rotation    Elbow flexion    Elbow extension    Wrist flexion    Wrist extension    Wrist ulnar deviation    Wrist radial deviation    Wrist pronation    Wrist supination    (Blank rows = not tested)  UPPER EXTREMITY MMT:  MMT Right eval Left eval  Shoulder flexion    Shoulder extension    Shoulder abduction    Shoulder adduction    Shoulder internal rotation    Shoulder external rotation    Middle trapezius    Lower trapezius    Elbow flexion    Elbow extension    Wrist flexion    Wrist extension    Wrist ulnar deviation    Wrist radial deviation    Wrist pronation    Wrist supination    Grip strength (lbs)    (Blank rows = not tested)  JOINT MOBILITY TESTING:  Not able to assess with movement today due to surgery.   PALPATION:  Tenderness surrounding sutures and under armpit at lat insertion.                                                                                                                              TREATMENT DATE:  11/25 Manual: STM to UT  PROM into flexion, ER, and abduction to tolerance and  within protocol with distraction  Grade 1 and 2 inf/post GHJ glides   There-ex:  Isometrics  IR iso 2x10 3-5 sec hold  Flexion iso 2x10 3-5 sechold  ER iso 2x10 3-5 sec hold   Reviewed scap retraction  Reviewed HEP   11/20  Manual: STM to UT  PROM into flexion, ER, and abduction to tolerance and within protocol with distraction  Grade I inf/post GHJ glides   There-ex: Self Elbow flexion  2x10  Scap retraction 3x10  Table slidein flexion 2x10  Pulley x15 fwd and x15 scaption   08/14/24  STM to UT, lat, periscap PROM all planes (gentle) Pendulums multi plane with cuing for technique (reviewed and corrected technique) Posterior shoulder rolls x10 Seated scap retraction 2x10 UT stretching 3x30sec Supine cane flexion 2x10 Pulleys each flexion and scaption Seated table slides abduction 2x10 HEP update and review  08/07/24 Manual: STM to UT  PROM into flexion, ER, and abduction to tolerance and within protocol with distraction  Grade I inf/post GHJ glides    Pendulums multi plane with cuing for technique  Elbow flexion 2x10    07/31/24 - PROM into flexion, ER and ABD to tolerance and within protocol - Scap retraction - STM to UT and lats  - UT/ LS stretch   07/21/24: - elbow flex/ ext  - PROM into flexion, ER and ABD to tolerance and within protocol - UT stretch  - Fixed bandages to ensure they were waterproof.  - Scap retraction - STM to UT and lats    07/18/24: Creating, reviewing, and completing below HEP  - PROM into flexion, ER and Ab to tolerance and within protocol -Scap retraction - Elbow flex/ ext - UT stretch    PATIENT EDUCATION: Education details: Educated pt on anatomy and physiology of current symptoms, DASH, diagnosis, prognosis, HEP,  and POC Person educated: Patient Education method: Explanation, Demonstration, Verbal cues, and Handouts Education comprehension: verbalized understanding and returned demonstration  HOME EXERCISE PROGRAM: Access Code: [email or web address] URL: [email or web address] Date: 07/18/2024 Prepared by: Rojean Batten  Exercises - Seated Scapular Retraction  - 1 x daily - 7 x weekly - 3 sets - 10 reps - Seated Cervical Sidebending Stretch  - 1 x daily - 7 x weekly - 3 sets - 10 reps - Seated Levator Scapulae Stretch  - 1 x daily - 7 x weekly - 3 sets - 10 reps - Seated Elbow Flexion and Extension  AROM  - 1 x daily - 7 x weekly - 3 sets - 10 reps  ASSESSMENT:  CLINICAL IMPRESSION:  Therapy initiated isometrics today. She tolerated well. She had fatigue with ER but no significant pain. We reviewed her HEP and updated. Therapy will continue to progress as tolerated. We will progress into AAROM next visit if she tolerates.   Eval:Patient referred to PT for  s/p R RTC repair. Changed dressings, reviewed precautions, demonstrated and reviewed HEP with pt. She is doing well, pain is managed by rest, ice and meds. She is using her sling and having her husband help her with ADL's. No active bleeding or signs of infection. Pt tolerated PROM fairly with pain as expected.Patient will benefit from skilled PT to address below impairments, limitations and improve overall function.  OBJECTIVE IMPAIRMENTS: decreased activity tolerance, decreased shoulder mobility, decreased ROM, decreased strength, impaired flexibility, impaired UE use, postural dysfunction, and pain.  ACTIVITY LIMITATIONS: reaching, lifting, carry,  cleaning, driving, and or occupation  PERSONAL FACTORS:  also affecting patient's functional outcome.  REHAB POTENTIAL: Good  CLINICAL DECISION MAKING: Stable/uncomplicated  EVALUATION COMPLEXITY: Low    GOALS: Short term PT Goals Target date: 08/01/24 Pt will be I and compliant with HEP. Baseline:  Goal status: New Pt will decrease pain by 25% overall Baseline: Goal status: New  Long term PT goals Target date: 10/10/24 Pt will improve Rt shoulder AROM to [facility] to improve functional reaching Baseline: Goal status: New Pt will improve  Rt shoulder strength to at least 4+/5 MMT to improve functional strength Baseline: Goal status: New Pt will improve Quick Dash to at least 60% functional to show improved function Baseline: Goal status: New Pt will reduce pain to overall less than 3/10 with usual activity and work activity. Baseline: Goal status: New  PLAN: PT FREQUENCY:  1-2  PT DURATION: 8-12 weeks  PLANNED INTERVENTIONS (unless contraindicated): aquatic PT, Canalith repositioning, cryotherapy, Electrical stimulation, Iontophoresis with 4 mg/ml dexamethasome, Moist heat, traction, Ultrasound, gait training, Therapeutic exercise, balance training, neuromuscular re-education, patient/family education, prosthetic training, manual techniques, passive ROM, dry needling, taping, vasopnuematic device, vestibular, spinal manipulations, joint manipulations  PLAN FOR NEXT SESSION: Follow protocol.     Alm JINNY Don, PT 08/23/2024, 9:44 AM

## 2024-08-23 ENCOUNTER — Encounter (HOSPITAL_BASED_OUTPATIENT_CLINIC_OR_DEPARTMENT_OTHER): Payer: Self-pay | Admitting: Physical Therapy

## 2024-08-25 ENCOUNTER — Encounter (INDEPENDENT_AMBULATORY_CARE_PROVIDER_SITE_OTHER): Payer: Self-pay

## 2024-08-26 ENCOUNTER — Ambulatory Visit (HOSPITAL_BASED_OUTPATIENT_CLINIC_OR_DEPARTMENT_OTHER): Admitting: Physical Therapy

## 2024-08-26 ENCOUNTER — Encounter (HOSPITAL_BASED_OUTPATIENT_CLINIC_OR_DEPARTMENT_OTHER): Payer: Self-pay | Admitting: Physical Therapy

## 2024-08-26 DIAGNOSIS — M25511 Pain in right shoulder: Secondary | ICD-10-CM

## 2024-08-26 DIAGNOSIS — M6281 Muscle weakness (generalized): Secondary | ICD-10-CM | POA: Diagnosis not present

## 2024-08-26 DIAGNOSIS — G8929 Other chronic pain: Secondary | ICD-10-CM

## 2024-08-26 DIAGNOSIS — M25611 Stiffness of right shoulder, not elsewhere classified: Secondary | ICD-10-CM

## 2024-08-26 NOTE — Therapy (Signed)
 OUTPATIENT PHYSICAL THERAPY SHOULDER TREATMENT   Patient Name: Hailey [name] MRN: [id number] DOB:1961-09-09, 63 y.o., female Today's Date: 08/26/2024  END OF SESSION:  PT End of Session - 08/26/24 1108     Visit Number 8    Number of Visits 24    Date for Recertification  10/10/24    Authorization Type [employer]    PT Start Time 1106    PT Stop Time 1145    PT Time Calculation (min) 39 min    Activity Tolerance Patient tolerated treatment well    Behavior During Therapy WFL for tasks assessed/performed              Past Medical History:  Diagnosis Date   Allergy 1982   Anemia    Arthritis 2018   B12 deficiency    CARPAL TUNNEL SYNDROME, BILATERAL 08/04/2010   GERD 03/06/2008   HYPERTENSION 02/10/2007   Obesity    OBESITY NOS 02/10/2007   Sinusitis    Swelling    Vitamin D  deficiency    Past Surgical History:  Procedure Laterality Date   CARPAL TUNNEL RELEASE     Both hands   COLONOSCOPY     2012 at [facility] SURGERY     HERNIA REPAIR  2006   JOINT REPLACEMENT  09/2018   NASAL SINUS SURGERY     TOTAL KNEE ARTHROPLASTY Left 10/16/2019   Procedure: LEFT TOTAL KNEE ARTHROPLASTY;  Surgeon: Jerri Kay HERO, MD;  Location: [facility] OR;  Service: Orthopedics;  Laterality: Left;   Patient Active Problem List   Diagnosis Date Noted   BMI 40.0-44.9, adult (HCC) 05/24/2023   Acute sinusitis 02/23/2023   Postnasal drip 02/23/2023   Osteopenia 11/24/2021   Thoracic radiculopathy 10/23/2021   Thoracic myofascial strain 10/23/2021   Viral syndrome 03/27/2021   Primary osteoarthritis of right knee 09/10/2020   At risk for activity intolerance 09/03/2020   At risk for complication associated with hypotension 07/18/2020   Vitamin D  deficiency 05/30/2020   B12 nutritional deficiency 05/30/2020   Anemia 05/30/2020   Status post total left knee replacement 10/16/2019   Primary osteoarthritis of left knee 09/06/2019   Screening for diabetes mellitus  02/24/2019   Chronic sinusitis 10/04/2018   CARPAL TUNNEL SYNDROME, BILATERAL 08/04/2010   Viral upper respiratory tract infection 10/16/2008   GERD 03/06/2008   Class 2 obesity due to excess calories without serious comorbidity with body mass index (BMI) of 38.0 to 38.9 in adult 02/10/2007   Essential hypertension 02/10/2007    PCP: Berneta Elsie Sayre, MD  REFERRING PROVIDER: Genelle Standing, MD  REFERRING DIAG: Traumatic complete tear of right rotator cuff, initial encounter [S46.011A]   THERAPY DIAG:  Muscle weakness (generalized)  Acute pain of right shoulder  Impaired range of motion of right shoulder  Chronic right shoulder pain  Rationale for Evaluation and Treatment: Rehabilitation  ONSET DATE: 07/13/24  SUBJECTIVE:  SUBJECTIVE STATEMENT: The patient gets a little more sore after the iso's but not too bad. She is just a little sore today.  Hand dominance: Left  PERTINENT HISTORY: Allergy, Anemia, B12 deficiency, Carpal tunnel syndrome, GERD, HTN, Obesity  PAIN:  Are you having pain? Yes: NPRS scale: 4-5/10 Pain location: R shoulder Pain description: Achy pain  Aggravating factors: Moving it Relieving factors: Rest, ice, meds  PRECAUTIONS: Shoulder  RED FLAGS: None   WEIGHT BEARING RESTRICTIONS: No  FALLS:  Has patient fallen in last 6 months? 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   NEXT MD VISIT:   OBJECTIVE:  Note: Objective measures were completed at Evaluation unless otherwise noted.  DIAGNOSTIC FINDINGS:  1. Complete full-thickness, full width tear of the supraspinatus tendon with  3.3 cm of retraction. 2. Moderate infraspinatus tendinosis. 3. Complete subscapularis tear with 3.7 cm of retraction. 4. Medial dislocation of the proximal long head of the biceps tendon with moderate tendinosis. 5. Mild osteoarthritis of the glenohumeral joint.  PATIENT SURVEYS:  Quick Dash: 100  COGNITION: Overall cognitive status: Within functional limits for tasks assessed     SENSATION: WFL  POSTURE: Rounded   UPPER EXTREMITY ROM:   Passive ROM Right eval Left eval  Shoulder flexion    Shoulder extension    Shoulder abduction    Shoulder adduction    Shoulder internal rotation    Shoulder external rotation    Elbow flexion    Elbow extension    Wrist flexion    Wrist extension    Wrist ulnar deviation    Wrist radial deviation    Wrist pronation    Wrist supination    (Blank rows = not tested)  UPPER EXTREMITY MMT:  MMT Right eval Left eval  Shoulder flexion    Shoulder extension    Shoulder abduction    Shoulder adduction    Shoulder internal rotation    Shoulder external rotation    Middle trapezius    Lower trapezius    Elbow flexion    Elbow extension    Wrist flexion    Wrist extension    Wrist ulnar deviation    Wrist radial deviation    Wrist pronation    Wrist supination    Grip strength (lbs)    (Blank rows = not tested)  JOINT MOBILITY TESTING:  Not able to assess with movement today due to surgery.   PALPATION:  Tenderness surrounding sutures and under armpit at lat insertion.                                                                                                                              TREATMENT DATE:  11/29 Manual: STM to UT  PROM into flexion, ER, and abduction to tolerance and within  protocol with distraction  Grade 1 and 2 inf/post GHJ glides  There-ex:  Pulleys 1 min  Wand press 1lb 3x10  Wand flexion 1lb 3x10   Neuro re-ed:  Scap retraction 3x10 yellow  Shoulder extension 3x10 yellow    11/25 Manual: STM to UT  PROM into flexion, ER, and abduction to tolerance and within protocol with distraction  Grade 1 and 2 inf/post GHJ glides   There-ex:  Isometrics  IR iso 2x10 3-5 sec hold  Flexion iso 2x10 3-5 sechold  ER iso 2x10 3-5 sec hold   Reviewed scap retraction  Reviewed HEP   11/20  Manual: STM to UT  PROM into flexion, ER, and abduction to tolerance and within protocol with distraction  Grade I inf/post GHJ glides   There-ex: Self Elbow flexion 2x10  Scap retraction 3x10  Table slidein flexion 2x10  Pulley x15 fwd and x15 scaption   08/14/24  STM to UT, lat, periscap PROM all planes (gentle) Pendulums multi plane with cuing for technique (reviewed and corrected technique) Posterior shoulder rolls x10 Seated scap retraction 2x10 UT stretching 3x30sec Supine cane flexion 2x10 Pulleys each flexion and scaption Seated table slides abduction 2x10 HEP update and review  08/07/24 Manual: STM to UT  PROM into flexion, ER, and abduction to tolerance and within protocol with distraction  Grade I inf/post GHJ glides    Pendulums multi plane with cuing for technique  Elbow flexion 2x10    07/31/24 - PROM into flexion, ER and ABD to tolerance and within protocol - Scap retraction - STM to UT and lats  - UT/ LS stretch   07/21/24: - elbow flex/ ext  - PROM into flexion, ER and ABD to tolerance and within protocol - UT stretch  - Fixed bandages to ensure they were waterproof.  - Scap retraction - STM to UT and lats    07/18/24: Creating, reviewing, and completing below HEP  - PROM into flexion, ER and Ab to tolerance and within protocol -Scap retraction - Elbow flex/ ext - UT stretch    PATIENT EDUCATION: Education details: Educated pt on anatomy and physiology of current symptoms, DASH, diagnosis, prognosis, HEP,  and POC Person educated: Patient Education method: Explanation, Demonstration, Verbal cues, and  Handouts Education comprehension: verbalized understanding and returned demonstration  HOME EXERCISE PROGRAM: Access Code: [email or web address] URL: [email or web address] Date: 07/18/2024 Prepared by: Rojean Batten  Exercises - Seated Scapular Retraction  - 1 x daily - 7 x weekly - 3 sets - 10 reps - Seated Cervical Sidebending Stretch  - 1 x daily - 7 x weekly - 3 sets - 10 reps - Seated Levator Scapulae Stretch  - 1 x daily - 7 x weekly - 3 sets - 10 reps - Seated Elbow Flexion and Extension AROM  - 1 x daily - 7 x weekly - 3 sets - 10 reps  ASSESSMENT:  CLINICAL IMPRESSION:  Therapy added AAROM exercise and scap strengthening back into her program. We talked to her about symptom management as she works on these exercises. She had no significant increase in pain.    Eval:Patient referred to PT for  s/p R RTC repair. Changed dressings, reviewed precautions, demonstrated and reviewed HEP with pt. She is doing well, pain is managed by rest, ice and meds. She is using her sling and having her husband help her with ADL's. No active bleeding or signs of infection. Pt tolerated PROM fairly with pain as expected.Patient will benefit from skilled PT to address  below impairments, limitations and improve overall function.  OBJECTIVE IMPAIRMENTS: decreased activity tolerance, decreased shoulder mobility, decreased ROM, decreased strength, impaired flexibility, impaired UE use, postural dysfunction, and pain.  ACTIVITY LIMITATIONS: reaching, lifting, carry,  cleaning, driving, and or occupation  PERSONAL FACTORS:  also affecting patient's functional outcome.  REHAB POTENTIAL: Good  CLINICAL DECISION MAKING: Stable/uncomplicated  EVALUATION COMPLEXITY: Low    GOALS: Short term PT Goals Target date: 08/01/24 Pt will be I and compliant with HEP. Baseline:  Goal status: New Pt will decrease pain by 25% overall Baseline: Goal status: New  Long term PT goals Target date: 10/10/24 Pt  will improve Rt shoulder AROM to [facility] to improve functional reaching Baseline: Goal status: New Pt will improve  Rt shoulder strength to at least 4+/5 MMT to improve functional strength Baseline: Goal status: New Pt will improve Quick Dash to at least 60% functional to show improved function Baseline: Goal status: New Pt will reduce pain to overall less than 3/10 with usual activity and work activity. Baseline: Goal status: New  PLAN: PT FREQUENCY: 1-2  PT DURATION: 8-12 weeks  PLANNED INTERVENTIONS (unless contraindicated): aquatic PT, Canalith repositioning, cryotherapy, Electrical stimulation, Iontophoresis with 4 mg/ml dexamethasome, Moist heat, traction, Ultrasound, gait training, Therapeutic exercise, balance training, neuromuscular re-education, patient/family education, prosthetic training, manual techniques, passive ROM, dry needling, taping, vasopnuematic device, vestibular, spinal manipulations, joint manipulations  PLAN FOR NEXT SESSION: Follow protocol.     Alm JINNY Don, PT 08/26/2024, 11:26 AM

## 2024-08-28 ENCOUNTER — Telehealth (HOSPITAL_BASED_OUTPATIENT_CLINIC_OR_DEPARTMENT_OTHER): Payer: Self-pay | Admitting: Orthopaedic Surgery

## 2024-08-28 NOTE — Telephone Encounter (Signed)
 Patient has a questions her release date for work. I advise her to bring paper work in when she comes so Dr B can look over it

## 2024-08-29 ENCOUNTER — Ambulatory Visit (HOSPITAL_BASED_OUTPATIENT_CLINIC_OR_DEPARTMENT_OTHER): Attending: Orthopaedic Surgery

## 2024-08-29 ENCOUNTER — Encounter (HOSPITAL_BASED_OUTPATIENT_CLINIC_OR_DEPARTMENT_OTHER): Payer: Self-pay

## 2024-08-29 DIAGNOSIS — M25611 Stiffness of right shoulder, not elsewhere classified: Secondary | ICD-10-CM | POA: Diagnosis present

## 2024-08-29 DIAGNOSIS — G8929 Other chronic pain: Secondary | ICD-10-CM | POA: Diagnosis present

## 2024-08-29 DIAGNOSIS — M6281 Muscle weakness (generalized): Secondary | ICD-10-CM | POA: Diagnosis present

## 2024-08-29 DIAGNOSIS — M25511 Pain in right shoulder: Secondary | ICD-10-CM | POA: Insufficient documentation

## 2024-08-29 NOTE — Therapy (Signed)
 OUTPATIENT PHYSICAL THERAPY SHOULDER TREATMENT   Patient Name: Hailey [name] MRN: [id number] DOB:08-Nov-1960, 63 y.o., female Today's Date: 08/29/2024  END OF SESSION:  PT End of Session - 08/29/24 0915     Visit Number 9    Number of Visits 24    Date for Recertification  10/10/24    Authorization Type [employer]    Progress Note Due on Visit 10    PT Start Time 0853    PT Stop Time 0931    PT Time Calculation (min) 38 min    Activity Tolerance Patient tolerated treatment well    Behavior During Therapy [facility] for tasks assessed/performed               Past Medical History:  Diagnosis Date   Allergy 1982   Anemia    Arthritis 2018   B12 deficiency    CARPAL TUNNEL SYNDROME, BILATERAL 08/04/2010   GERD 03/06/2008   HYPERTENSION 02/10/2007   Obesity    OBESITY NOS 02/10/2007   Sinusitis    Swelling    Vitamin D  deficiency    Past Surgical History:  Procedure Laterality Date   CARPAL TUNNEL RELEASE     Both hands   COLONOSCOPY     2012 at [facility] SURGERY     HERNIA REPAIR  2006   JOINT REPLACEMENT  09/2018   NASAL SINUS SURGERY     TOTAL KNEE ARTHROPLASTY Left 10/16/2019   Procedure: LEFT TOTAL KNEE ARTHROPLASTY;  Surgeon: Jerri Kay HERO, MD;  Location: [facility] OR;  Service: Orthopedics;  Laterality: Left;   Patient Active Problem List   Diagnosis Date Noted   BMI 40.0-44.9, adult (HCC) 05/24/2023   Acute sinusitis 02/23/2023   Postnasal drip 02/23/2023   Osteopenia 11/24/2021   Thoracic radiculopathy 10/23/2021   Thoracic myofascial strain 10/23/2021   Viral syndrome 03/27/2021   Primary osteoarthritis of right knee 09/10/2020   At risk for activity intolerance 09/03/2020   At risk for complication associated with hypotension 07/18/2020   Vitamin D  deficiency 05/30/2020   B12 nutritional deficiency 05/30/2020   Anemia 05/30/2020   Status post total left knee replacement 10/16/2019   Primary osteoarthritis of left knee 09/06/2019    Screening for diabetes mellitus 02/24/2019   Chronic sinusitis 10/04/2018   CARPAL TUNNEL SYNDROME, BILATERAL 08/04/2010   Viral upper respiratory tract infection 10/16/2008   GERD 03/06/2008   Class 2 obesity due to excess calories without serious comorbidity with body mass index (BMI) of 38.0 to 38.9 in adult 02/10/2007   Essential hypertension 02/10/2007    PCP: Berneta Elsie Sayre, MD  REFERRING PROVIDER: Genelle Standing, MD  REFERRING DIAG: Traumatic complete tear of right rotator cuff, initial encounter [S46.011A]   THERAPY DIAG:  Muscle weakness (generalized)  Acute pain of right shoulder  Impaired range of motion of right shoulder  Chronic right shoulder pain  Rationale for Evaluation and Treatment: Rehabilitation  ONSET DATE: 07/13/24  SUBJECTIVE:  SUBJECTIVE STATEMENT: The patient gets a little more sore after the iso's but not too bad. She is just a little sore today.  Hand dominance: Left  PERTINENT HISTORY: Allergy, Anemia, B12 deficiency, Carpal tunnel syndrome, GERD, HTN, Obesity  PAIN:  Are you having pain? Yes: NPRS scale: 4-5/10 Pain location: R shoulder Pain description: Achy pain  Aggravating factors: Moving it Relieving factors: Rest, ice, meds  PRECAUTIONS: Shoulder  RED FLAGS: None   WEIGHT BEARING RESTRICTIONS: No  FALLS:  Has patient fallen in last 6 months? 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   NEXT MD VISIT:   OBJECTIVE:  Note: Objective measures were completed at Evaluation unless otherwise noted.  DIAGNOSTIC FINDINGS:  1. Complete full-thickness, full width tear of  the supraspinatus tendon with 3.3 cm of retraction. 2. Moderate infraspinatus tendinosis. 3. Complete subscapularis tear with 3.7 cm of retraction. 4. Medial dislocation of the proximal long head of the biceps tendon with moderate tendinosis. 5. Mild osteoarthritis of the glenohumeral joint.  PATIENT SURVEYS:  Quick Dash: 100  COGNITION: Overall cognitive status: Within functional limits for tasks assessed     SENSATION: WFL  POSTURE: Rounded   UPPER EXTREMITY ROM:   Passive ROM Right eval Left eval  Shoulder flexion    Shoulder extension    Shoulder abduction    Shoulder adduction    Shoulder internal rotation    Shoulder external rotation    Elbow flexion    Elbow extension    Wrist flexion    Wrist extension    Wrist ulnar deviation    Wrist radial deviation    Wrist pronation    Wrist supination    (Blank rows = not tested)  UPPER EXTREMITY MMT:  MMT Right eval Left eval  Shoulder flexion    Shoulder extension    Shoulder abduction    Shoulder adduction    Shoulder internal rotation    Shoulder external rotation    Middle trapezius    Lower trapezius    Elbow flexion    Elbow extension    Wrist flexion    Wrist extension    Wrist ulnar deviation    Wrist radial deviation    Wrist pronation    Wrist supination    Grip strength (lbs)    (Blank rows = not tested)  JOINT MOBILITY TESTING:  Not able to assess with movement today due to surgery.   PALPATION:  Tenderness surrounding sutures and under armpit at lat insertion.                                                                                                                              TREATMENT DATE:   12/1 PROM all planes Posterior shoulder rolls x10 Seated scap retraction 2x10 Supine cane flexion  2x10 Supine active flexion 2x10 S/l abduction 2x10 S/l ER 2x10 Seated wand flexion 2x5 Pulleys flexion Theraband row RTB 2x10     11/29 Manual: STM to UT  PROM into  flexion, ER, and abduction to tolerance and within protocol with distraction  Grade 1 and 2 inf/post GHJ glides  There-ex:  Pulleys 1 min  Wand press 1lb 3x10  Wand flexion 1lb 3x10   Neuro re-ed:  Scap retraction 3x10 yellow  Shoulder extension 3x10 yellow   11/25 Manual: STM to UT  PROM into flexion, ER, and abduction to tolerance and within protocol with distraction  Grade 1 and 2 inf/post GHJ glides   There-ex:  Isometrics  IR iso 2x10 3-5 sec hold  Flexion iso 2x10 3-5 sechold  ER iso 2x10 3-5 sec hold   Reviewed scap retraction  Reviewed HEP   11/20  Manual: STM to UT  PROM into flexion, ER, and abduction to tolerance and within protocol with distraction  Grade I inf/post GHJ glides   There-ex: Self Elbow flexion 2x10  Scap retraction 3x10  Table slidein flexion 2x10  Pulley x15 fwd and x15 scaption   08/14/24  STM to UT, lat, periscap PROM all planes (gentle) Pendulums multi plane with cuing for technique (reviewed and corrected technique) Posterior shoulder rolls x10 Seated scap retraction 2x10 UT stretching 3x30sec Supine cane flexion 2x10 Pulleys each flexion and scaption Seated table slides abduction 2x10 HEP update and review  08/07/24 Manual: STM to UT  PROM into flexion, ER, and abduction to tolerance and within protocol with distraction  Grade I inf/post GHJ glides    Pendulums multi plane with cuing for technique  Elbow flexion 2x10    07/31/24 - PROM into flexion, ER and ABD to tolerance and within protocol - Scap retraction - STM to UT and lats  - UT/ LS stretch   07/21/24: - elbow flex/ ext  - PROM into flexion, ER and ABD to tolerance and within protocol - UT stretch  - Fixed bandages to ensure they were waterproof.  - Scap retraction - STM to UT and lats    07/18/24: Creating, reviewing, and completing below HEP  - PROM into flexion, ER and Ab to tolerance and within protocol -Scap retraction - Elbow flex/  ext - UT stretch    PATIENT EDUCATION: Education details: Educated pt on anatomy and physiology of current symptoms, DASH, diagnosis, prognosis, HEP,  and POC Person educated: Patient Education method: Explanation, Demonstration, Verbal cues, and Handouts Education comprehension: verbalized understanding and returned demonstration  HOME EXERCISE PROGRAM: Access Code: [email or web address] URL: [email or web address] Date: 07/18/2024 Prepared by: Rojean Batten  Exercises - Seated Scapular Retraction  - 1 x daily - 7 x weekly - 3 sets - 10 reps - Seated Cervical Sidebending Stretch  - 1 x daily - 7 x weekly - 3 sets - 10 reps - Seated Levator Scapulae Stretch  - 1 x daily - 7 x weekly - 3 sets - 10 reps - Seated Elbow Flexion and Extension AROM  - 1 x daily - 7 x weekly - 3 sets - 10 reps  ASSESSMENT:  CLINICAL IMPRESSION:  Pt over 6 weeks s/p at this time. Good tolerance for AROM progressions in gravity assisted positions. Overall good PROM with notable tightness into IR. Will continue to progress as tolerated. Will update HEP next session if good response to progressions.   Eval:Patient referred to PT for  s/p R RTC repair. Changed dressings, reviewed precautions, demonstrated and  reviewed HEP with pt. She is doing well, pain is managed by rest, ice and meds. She is using her sling and having her husband help her with ADL's. No active bleeding or signs of infection. Pt tolerated PROM fairly with pain as expected.Patient will benefit from skilled PT to address below impairments, limitations and improve overall function.  OBJECTIVE IMPAIRMENTS: decreased activity tolerance, decreased shoulder mobility, decreased ROM, decreased strength, impaired flexibility, impaired UE use, postural dysfunction, and pain.  ACTIVITY LIMITATIONS: reaching, lifting, carry,  cleaning, driving, and or occupation  PERSONAL FACTORS:  also affecting patient's functional outcome.  REHAB POTENTIAL:  Good  CLINICAL DECISION MAKING: Stable/uncomplicated  EVALUATION COMPLEXITY: Low    GOALS: Short term PT Goals Target date: 08/01/24 Pt will be I and compliant with HEP. Baseline:  Goal status: New Pt will decrease pain by 25% overall Baseline: Goal status: New  Long term PT goals Target date: 10/10/24 Pt will improve Rt shoulder AROM to [facility] to improve functional reaching Baseline: Goal status: New Pt will improve  Rt shoulder strength to at least 4+/5 MMT to improve functional strength Baseline: Goal status: New Pt will improve Quick Dash to at least 60% functional to show improved function Baseline: Goal status: New Pt will reduce pain to overall less than 3/10 with usual activity and work activity. Baseline: Goal status: New  PLAN: PT FREQUENCY: 1-2  PT DURATION: 8-12 weeks  PLANNED INTERVENTIONS (unless contraindicated): aquatic PT, Canalith repositioning, cryotherapy, Electrical stimulation, Iontophoresis with 4 mg/ml dexamethasome, Moist heat, traction, Ultrasound, gait training, Therapeutic exercise, balance training, neuromuscular re-education, patient/family education, prosthetic training, manual techniques, passive ROM, dry needling, taping, vasopnuematic device, vestibular, spinal manipulations, joint manipulations  PLAN FOR NEXT SESSION: Follow protocol.     Asberry BRAVO Hanna Ra, PTA 08/29/2024, 9:34 AM

## 2024-09-01 ENCOUNTER — Ambulatory Visit (INDEPENDENT_AMBULATORY_CARE_PROVIDER_SITE_OTHER): Admitting: Orthopaedic Surgery

## 2024-09-01 ENCOUNTER — Encounter (HOSPITAL_BASED_OUTPATIENT_CLINIC_OR_DEPARTMENT_OTHER): Payer: Self-pay | Admitting: Physical Therapy

## 2024-09-01 ENCOUNTER — Ambulatory Visit (HOSPITAL_BASED_OUTPATIENT_CLINIC_OR_DEPARTMENT_OTHER)

## 2024-09-01 DIAGNOSIS — M6281 Muscle weakness (generalized): Secondary | ICD-10-CM | POA: Diagnosis not present

## 2024-09-01 DIAGNOSIS — S46011A Strain of muscle(s) and tendon(s) of the rotator cuff of right shoulder, initial encounter: Secondary | ICD-10-CM

## 2024-09-01 DIAGNOSIS — M25511 Pain in right shoulder: Secondary | ICD-10-CM

## 2024-09-01 NOTE — Progress Notes (Signed)
 Post Operative Evaluation    Procedure/Date of Surgery: Right shoulder rotator cuff repair biceps tenodesis 10/16  Interval History:   Presents 6 weeks status post the above procedure.  Overall she is doing extremely well.  Overhead range of motion is now back to normal   PMH/PSH/Family History/Social History/Meds/Allergies:    Past Medical History:  Diagnosis Date   Allergy 1982   Anemia    Arthritis 2018   B12 deficiency    CARPAL TUNNEL SYNDROME, BILATERAL 08/04/2010   GERD 03/06/2008   HYPERTENSION 02/10/2007   Obesity    OBESITY NOS 02/10/2007   Sinusitis    Swelling    Vitamin D  deficiency    Past Surgical History:  Procedure Laterality Date   CARPAL TUNNEL RELEASE     Both hands   COLONOSCOPY     2012 at [facility] SURGERY     HERNIA REPAIR  2006   JOINT REPLACEMENT  09/2018   NASAL SINUS SURGERY     TOTAL KNEE ARTHROPLASTY Left 10/16/2019   Procedure: LEFT TOTAL KNEE ARTHROPLASTY;  Surgeon: Jerri Kay HERO, MD;  Location: [facility] OR;  Service: Orthopedics;  Laterality: Left;   Social History   Socioeconomic History   Marital status: Married    Spouse name: Not on file   Number of children: Not on file   Years of education: Not on file   Highest education level: Associate degree: occupational, scientist, product/process development, or vocational program  Occupational History   Not on file  Tobacco Use   Smoking status: Never   Smokeless tobacco: Never  Vaping Use   Vaping status: Never Used  Substance and Sexual Activity   Alcohol use: Yes    Comment: rarely   Drug use: No   Sexual activity: Not on file  Other Topics Concern   Not on file  Social History Narrative   Not on file   Social Drivers of Health   Financial Resource Strain: Low Risk  (06/09/2024)   Overall Financial Resource Strain (CARDIA)    Difficulty of Paying Living Expenses: Not hard at all  Food Insecurity: No Food Insecurity (06/09/2024)   Hunger Vital Sign     Worried About Running Out of Food in the Last Year: Never true    Ran Out of Food in the Last Year: Never true  Transportation Needs: No Transportation Needs (06/09/2024)   PRAPARE - Administrator, Civil Service (Medical): No    Lack of Transportation (Non-Medical): No  Physical Activity: Sufficiently Active (06/09/2024)   Exercise Vital Sign    Days of Exercise per Week: 5 days    Minutes of Exercise per Session: 60 min  Stress: Stress Concern Present (06/09/2024)   [employer] of Occupational Health - Occupational Stress Questionnaire    Feeling of Stress: Very much  Social Connections: Socially Integrated (06/09/2024)   Social Connection and Isolation Panel    Frequency of Communication with Friends and Family: More than three times a week    Frequency of Social Gatherings with Friends and Family: Once a week    Attends Religious Services: More than 4 times per year    Active Member of [employer] or Organizations: Yes    Attends Engineer, Structural: More than 4 times per year    Marital Status: Married  Family History  Problem Relation Age of Onset   Liver disease Mother    Hypertension Father    Arthritis Father    Colon cancer Neg Hx    Esophageal cancer Neg Hx    Rectal cancer Neg Hx    Stomach cancer Neg Hx    Allergies  Allergen Reactions   Other Other (See Comments)   Sulfamethoxazole Hives and Dermatitis    sulfamethoxazole   Current Outpatient Medications  Medication Sig Dispense Refill   aspirin  EC 325 MG tablet Take 1 tablet (325 mg total) by mouth daily. 14 tablet 0   benazepril  (LOTENSIN ) 20 MG tablet Take 1 tablet (20 mg total) by mouth daily. 90 tablet 1   calcium carbonate (OSCAL) 1500 (600 Ca) MG TABS tablet Take by mouth 2 (two) times daily with a meal.     cetirizine-pseudoephedrine  (ZYRTEC-D) 5-120 MG tablet Take 1 tablet by mouth daily as needed for allergies.     cholecalciferol (VITAMIN D3) 25 MCG (1000 UNIT) tablet Take 1,000  Units by mouth daily.     FERROUS SULFATE PO Take 1 tablet by mouth daily.      methocarbamol  (ROBAXIN ) 500 MG tablet Take 1 tablet (500 mg total) by mouth every 8 (eight) hours as needed for muscle spasms. 30 tablet 0   methocarbamol  (ROBAXIN ) 500 MG tablet Take 1 tablet (500 mg total) by mouth 2 (two) times daily. 60 tablet 1   methylPREDNISolone  (MEDROL  DOSEPAK) 4 MG TBPK tablet Take as directed on foil pack 21 each 0   Multiple Vitamin (MULTIVITAMIN WITH MINERALS) TABS tablet Take 1 tablet by mouth daily.     No current facility-administered medications for this visit.   No results found.  Review of Systems:   A ROS was performed including pertinent positives and negatives as documented in the HPI.   Musculoskeletal Exam:     Right shoulder incisions are well-appearing without erythema or drainage.  Active forward elevation in the spine position is to 90 degrees with external rotation at side to 30 degrees  Imaging:      I personally reviewed and interpreted the radiographs.   Assessment:   6 weeks status post right shoulder rotator cuff repair and biceps tenodesis overall doing very well.  She will take 1 additional week to continue to work through rehab and I will plan to see her back in 6 weeks for reassessment  Plan :    - Return to clinic 6 weeks for reassessment      I personally saw and evaluated the patient, and participated in the management and treatment plan.  Elspeth Parker, MD Attending Physician, Orthopedic Surgery  This document was dictated using Dragon voice recognition software. A reasonable attempt at proof reading has been made to minimize errors.

## 2024-09-01 NOTE — Therapy (Signed)
 OUTPATIENT PHYSICAL THERAPY SHOULDER TREATMENT   Patient Name: Hailey [name] MRN: [id number] DOB:[birth year], 63 y.o., female Today's Date: 09/01/2024  END OF SESSION:  PT End of Session - 09/01/24 1115     Visit Number 10    Number of Visits 24    Date for Recertification  10/10/24    Authorization Type [employer]    Progress Note Due on Visit 20    PT Start Time 1115    PT Stop Time 1155    PT Time Calculation (min) 40 min    Activity Tolerance Patient tolerated treatment well    Behavior During Therapy [facility] for tasks assessed/performed                Past Medical History:  Diagnosis Date   Allergy 1982   Anemia    Arthritis 2018   B12 deficiency    CARPAL TUNNEL SYNDROME, BILATERAL 08/04/2010   GERD 03/06/2008   HYPERTENSION 02/10/2007   Obesity    OBESITY NOS 02/10/2007   Sinusitis    Swelling    Vitamin D  deficiency    Past Surgical History:  Procedure Laterality Date   CARPAL TUNNEL RELEASE     Both hands   COLONOSCOPY     2012 at [facility] SURGERY     HERNIA REPAIR  2006   JOINT REPLACEMENT  09/2018   NASAL SINUS SURGERY     TOTAL KNEE ARTHROPLASTY Left 10/16/2019   Procedure: LEFT TOTAL KNEE ARTHROPLASTY;  Surgeon: Jerri Kay HERO, MD;  Location: [facility] OR;  Service: Orthopedics;  Laterality: Left;   Patient Active Problem List   Diagnosis Date Noted   BMI 40.0-44.9, adult (HCC) 05/24/2023   Acute sinusitis 02/23/2023   Postnasal drip 02/23/2023   Osteopenia 11/24/2021   Thoracic radiculopathy 10/23/2021   Thoracic myofascial strain 10/23/2021   Viral syndrome 03/27/2021   Primary osteoarthritis of right knee 09/10/2020   At risk for activity intolerance 09/03/2020   At risk for complication associated with hypotension 07/18/2020   Vitamin D  deficiency 05/30/2020   B12 nutritional deficiency 05/30/2020   Anemia 05/30/2020   Status post total left knee replacement 10/16/2019   Primary osteoarthritis of left knee 09/06/2019    Screening for diabetes mellitus 02/24/2019   Chronic sinusitis 10/04/2018   CARPAL TUNNEL SYNDROME, BILATERAL 08/04/2010   Viral upper respiratory tract infection 10/16/2008   GERD 03/06/2008   Class 2 obesity due to excess calories without serious comorbidity with body mass index (BMI) of 38.0 to 38.9 in adult 02/10/2007   Essential hypertension 02/10/2007    PCP: Berneta Elsie Sayre, MD  REFERRING PROVIDER: Genelle Standing, MD  REFERRING DIAG: Traumatic complete tear of right rotator cuff, initial encounter [S46.011A]   THERAPY DIAG:  Muscle weakness (generalized)  Acute pain of right shoulder  Rationale for Evaluation and Treatment: Rehabilitation  ONSET DATE: 07/13/24  SUBJECTIVE:  SUBJECTIVE STATEMENT: I have been diligent so it is a little angry. RTW 09/13/24.  Hand dominance: Left  PERTINENT HISTORY: Allergy, Anemia, B12 deficiency, Carpal tunnel syndrome, GERD, HTN, Obesity  PAIN:  Are you having pain? Yes: NPRS scale: 4-5/10 Pain location: R shoulder Pain description: Achy pain  Aggravating factors: Moving it Relieving factors: Rest, ice, meds  PRECAUTIONS: Shoulder  RED FLAGS: None   WEIGHT BEARING RESTRICTIONS: No  FALLS:  Has patient fallen in last 6 months? 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   NEXT MD VISIT:   OBJECTIVE:  Note: Objective measures were completed at Evaluation unless otherwise noted.  DIAGNOSTIC FINDINGS:  1. Complete full-thickness, full width tear of the supraspinatus tendon with 3.3 cm of retraction. 2. Moderate infraspinatus tendinosis. 3. Complete  subscapularis tear with 3.7 cm of retraction. 4. Medial dislocation of the proximal long head of the biceps tendon with moderate tendinosis. 5. Mild osteoarthritis of the glenohumeral joint.  PATIENT SURVEYS:  Quick Dash: 100  COGNITION: Overall cognitive status: Within functional limits for tasks assessed     SENSATION: WFL  POSTURE: Rounded   UPPER EXTREMITY ROM:   Active/Passive ROM Right 12/5   Shoulder flexion 145   Shoulder extension    Shoulder abduction 125   Shoulder adduction    Shoulder internal rotation Thumb to midline sacrum   Shoulder external rotation    (Blank rows = not tested)  UPPER EXTREMITY MMT:  MMT Right eval Left eval  Shoulder flexion    Shoulder extension    Shoulder abduction    Shoulder adduction    Shoulder internal rotation    Shoulder external rotation    Middle trapezius    Lower trapezius    (Blank rows = not tested)  JOINT MOBILITY TESTING:  Not able to assess with movement today due to surgery.   PALPATION:  Tenderness surrounding sutures and under armpit at lat insertion.                                                                                                                              TREATMENT DATE:   12/5 Standing bent over row, horiz abd, Y- tactile cues for scapular motion MANUAL STM & IASTM periscap region, upper trap, levator Sidelying scapular mobilizations  12/1 PROM all planes Posterior shoulder rolls x10 Seated scap retraction 2x10 Supine cane flexion 2x10 Supine active flexion 2x10 S/l abduction 2x10 S/l ER 2x10 Seated wand flexion 2x5 Pulleys flexion Theraband row RTB 2x10     11/29 Manual: STM to UT  PROM into flexion, ER, and abduction to tolerance and within protocol with distraction  Grade 1 and 2 inf/post GHJ glides  There-ex:  Pulleys 1 min  Wand press 1lb 3x10  Wand flexion 1lb 3x10  Neuro re-ed:  Scap retraction 3x10 yellow  Shoulder extension 3x10 yellow    11/25 Manual: STM to UT  PROM into flexion, ER, and abduction to tolerance and within protocol with distraction  Grade 1 and 2 inf/post GHJ glides   There-ex:  Isometrics  IR iso 2x10 3-5 sec hold  Flexion iso 2x10 3-5 sechold  ER iso 2x10 3-5 sec hold   Reviewed scap retraction  Reviewed HEP   11/20  Manual: STM to UT  PROM into flexion, ER, and abduction to tolerance and within protocol with distraction  Grade I inf/post GHJ glides   There-ex: Self Elbow flexion 2x10  Scap retraction 3x10  Table slidein flexion 2x10  Pulley x15 fwd and x15 scaption   08/14/24  STM to UT, lat, periscap PROM all planes (gentle) Pendulums multi plane with cuing for technique (reviewed and corrected technique) Posterior shoulder rolls x10 Seated scap retraction 2x10 UT stretching 3x30sec Supine cane flexion 2x10 Pulleys each flexion and scaption Seated table slides abduction 2x10 HEP update and review  08/07/24 Manual: STM to UT  PROM into flexion, ER, and abduction to tolerance and within protocol with distraction  Grade I inf/post GHJ glides    Pendulums multi plane with cuing for technique  Elbow flexion 2x10    07/31/24 - PROM into flexion, ER and ABD to tolerance and within protocol - Scap retraction - STM to UT and lats  - UT/ LS stretch   07/21/24: - elbow flex/ ext  - PROM into flexion, ER and ABD to tolerance and within protocol - UT stretch  - Fixed bandages to ensure they were waterproof.  - Scap retraction - STM to UT and lats    07/18/24: Creating, reviewing, and completing below HEP  - PROM into flexion, ER and Ab to tolerance and within protocol -Scap retraction - Elbow flex/ ext - UT stretch    PATIENT EDUCATION: Education details: Educated pt on anatomy and physiology of current symptoms, DASH, diagnosis, prognosis, HEP,  and POC Person educated: Patient Education method: Explanation, Demonstration, Verbal cues, and  Handouts Education comprehension: verbalized understanding and returned demonstration  HOME EXERCISE PROGRAM: Access Code: [email or web address] URL: [email or web address] Date: 07/18/2024 Prepared by: Rojean Batten  Exercises - Seated Scapular Retraction  - 1 x daily - 7 x weekly - 3 sets - 10 reps - Seated Cervical Sidebending Stretch  - 1 x daily - 7 x weekly - 3 sets - 10 reps - Seated Levator Scapulae Stretch  - 1 x daily - 7 x weekly - 3 sets - 10 reps - Seated Elbow Flexion and Extension AROM  - 1 x daily - 7 x weekly - 3 sets - 10 reps  ASSESSMENT:  CLINICAL IMPRESSION:  Pt has made excellent progress in ROM against gravity. Tightness and spasm notable and decreased today. Plans to return to work on 12/17 and we discussed the importance of healing times that she will not be able to lift her usual work weights on day of return- verbalized understanding.    Eval:Patient referred to PT for  s/p R RTC repair. Changed dressings, reviewed precautions, demonstrated and reviewed HEP with pt. She is doing well, pain is managed by rest, ice and meds. She is using her sling and having her husband help her with ADL's. No active bleeding or signs of infection. Pt tolerated PROM fairly with pain as expected.Patient will benefit from skilled PT to address below impairments, limitations and improve overall function.  OBJECTIVE IMPAIRMENTS: decreased  activity tolerance, decreased shoulder mobility, decreased ROM, decreased strength, impaired flexibility, impaired UE use, postural dysfunction, and pain.  ACTIVITY LIMITATIONS: reaching, lifting, carry,  cleaning, driving, and or occupation  PERSONAL FACTORS:  also affecting patient's functional outcome.  REHAB POTENTIAL: Good  CLINICAL DECISION MAKING: Stable/uncomplicated  EVALUATION COMPLEXITY: Low    GOALS: Short term PT Goals Target date: 08/01/24 Pt will be I and compliant with HEP. Baseline:  Goal status: MET Pt will decrease pain  by 25% overall Baseline: Goal status: MET  Long term PT goals Target date: 10/10/24 Pt will improve Rt shoulder AROM to [facility] to improve functional reaching Baseline: Goal status: New Pt will improve  Rt shoulder strength to at least 4+/5 MMT to improve functional strength Baseline: Goal status: New Pt will improve Quick Dash to at least 60% functional to show improved function Baseline: Goal status: New Pt will reduce pain to overall less than 3/10 with usual activity and work activity. Baseline: Goal status: New  PLAN: PT FREQUENCY: 1-2  PT DURATION: 8-12 weeks  PLANNED INTERVENTIONS (unless contraindicated): aquatic PT, Canalith repositioning, cryotherapy, Electrical stimulation, Iontophoresis with 4 mg/ml dexamethasome, Moist heat, traction, Ultrasound, gait training, Therapeutic exercise, balance training, neuromuscular re-education, patient/family education, prosthetic training, manual techniques, passive ROM, dry needling, taping, vasopnuematic device, vestibular, spinal manipulations, joint manipulations  PLAN FOR NEXT SESSION: Follow protocol.     Harlene Cordon, PT, DPT 09/01/2024, 12:03 PM

## 2024-09-06 ENCOUNTER — Ambulatory Visit (HOSPITAL_BASED_OUTPATIENT_CLINIC_OR_DEPARTMENT_OTHER)

## 2024-09-06 ENCOUNTER — Encounter (HOSPITAL_BASED_OUTPATIENT_CLINIC_OR_DEPARTMENT_OTHER): Payer: Self-pay

## 2024-09-06 DIAGNOSIS — M6281 Muscle weakness (generalized): Secondary | ICD-10-CM | POA: Diagnosis not present

## 2024-09-06 DIAGNOSIS — M25611 Stiffness of right shoulder, not elsewhere classified: Secondary | ICD-10-CM

## 2024-09-06 DIAGNOSIS — M25511 Pain in right shoulder: Secondary | ICD-10-CM

## 2024-09-06 DIAGNOSIS — G8929 Other chronic pain: Secondary | ICD-10-CM

## 2024-09-06 NOTE — Therapy (Signed)
 OUTPATIENT PHYSICAL THERAPY SHOULDER TREATMENT   Patient Name: Hailey [name] MRN: [id number] DOB:[birth year], 63 y.o., female Today's Date: 09/06/2024  END OF SESSION:  PT End of Session - 09/06/24 1102     Visit Number 11    Number of Visits 24    Date for Recertification  10/10/24    Authorization Type [employer]    Progress Note Due on Visit 20    PT Start Time 1104    PT Stop Time 1145    PT Time Calculation (min) 41 min    Activity Tolerance Patient tolerated treatment well    Behavior During Therapy [facility] for tasks assessed/performed                 Past Medical History:  Diagnosis Date   Allergy 1982   Anemia    Arthritis 2018   B12 deficiency    CARPAL TUNNEL SYNDROME, BILATERAL 08/04/2010   GERD 03/06/2008   HYPERTENSION 02/10/2007   Obesity    OBESITY NOS 02/10/2007   Sinusitis    Swelling    Vitamin D  deficiency    Past Surgical History:  Procedure Laterality Date   CARPAL TUNNEL RELEASE     Both hands   COLONOSCOPY     2012 at [facility] SURGERY     HERNIA REPAIR  2006   JOINT REPLACEMENT  09/2018   NASAL SINUS SURGERY     TOTAL KNEE ARTHROPLASTY Left 10/16/2019   Procedure: LEFT TOTAL KNEE ARTHROPLASTY;  Surgeon: Jerri Kay HERO, MD;  Location: [facility] OR;  Service: Orthopedics;  Laterality: Left;   Patient Active Problem List   Diagnosis Date Noted   BMI 40.0-44.9, adult (HCC) 05/24/2023   Acute sinusitis 02/23/2023   Postnasal drip 02/23/2023   Osteopenia 11/24/2021   Thoracic radiculopathy 10/23/2021   Thoracic myofascial strain 10/23/2021   Viral syndrome 03/27/2021   Primary osteoarthritis of right knee 09/10/2020   At risk for activity intolerance 09/03/2020   At risk for complication associated with hypotension 07/18/2020   Vitamin D  deficiency 05/30/2020   B12 nutritional deficiency 05/30/2020   Anemia 05/30/2020   Status post total left knee replacement 10/16/2019   Primary osteoarthritis of left knee  09/06/2019   Screening for diabetes mellitus 02/24/2019   Chronic sinusitis 10/04/2018   CARPAL TUNNEL SYNDROME, BILATERAL 08/04/2010   Viral upper respiratory tract infection 10/16/2008   GERD 03/06/2008   Class 2 obesity due to excess calories without serious comorbidity with body mass index (BMI) of 38.0 to 38.9 in adult 02/10/2007   Essential hypertension 02/10/2007    PCP: Berneta Elsie Sayre, MD  REFERRING PROVIDER: Genelle Standing, MD  REFERRING DIAG: Traumatic complete tear of right rotator cuff, initial encounter [S46.011A]   THERAPY DIAG:  Muscle weakness (generalized)  Acute pain of right shoulder  Impaired range of motion of right shoulder  Chronic right shoulder pain  Rationale for Evaluation and Treatment: Rehabilitation  ONSET DATE: 07/13/24  SUBJECTIVE:  SUBJECTIVE STATEMENT: Pt denies pain at entry. Has been compliant with HEP. RTW 12/17. Hand dominance: Left  PERTINENT HISTORY: Allergy, Anemia, B12 deficiency, Carpal tunnel syndrome, GERD, HTN, Obesity  PAIN:  Are you having pain? Yes: NPRS scale: 4-5/10 Pain location: R shoulder Pain description: Achy pain  Aggravating factors: Moving it Relieving factors: Rest, ice, meds  PRECAUTIONS: Shoulder  RED FLAGS: None   WEIGHT BEARING RESTRICTIONS: No  FALLS:  Has patient fallen in last 6 months? 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   NEXT MD VISIT:   OBJECTIVE:  Note: Objective measures were completed at Evaluation unless otherwise noted.  DIAGNOSTIC FINDINGS:  1. Complete full-thickness, full width tear of the  supraspinatus tendon with 3.3 cm of retraction. 2. Moderate infraspinatus tendinosis. 3. Complete subscapularis tear with 3.7 cm of retraction. 4. Medial dislocation of the proximal long head of the biceps tendon with moderate tendinosis. 5. Mild osteoarthritis of the glenohumeral joint.  PATIENT SURVEYS:  Quick Dash: 100  COGNITION: Overall cognitive status: Within functional limits for tasks assessed     SENSATION: WFL  POSTURE: Rounded   UPPER EXTREMITY ROM:   Active/Passive ROM Right 12/5 Right 12/10  Shoulder flexion 145 171 supine AAROM  Shoulder extension    Shoulder abduction 125 175 Supine (AAROM)  Shoulder adduction    Shoulder internal rotation Thumb to midline sacrum 60deg  Shoulder external rotation  90deg  (Blank rows = not tested)  UPPER EXTREMITY MMT:  MMT Right eval Left eval  Shoulder flexion    Shoulder extension    Shoulder abduction    Shoulder adduction    Shoulder internal rotation    Shoulder external rotation    Middle trapezius    Lower trapezius    (Blank rows = not tested)  JOINT MOBILITY TESTING:  Not able to assess with movement today due to surgery.   PALPATION:  Tenderness surrounding sutures and under armpit at lat insertion.                                                                                                                              TREATMENT DATE:   12/10 PROM all planes Supine active flexion 2x10 S/l abduction 2x10 S/l ER 2x10 Supine wand flexion 2lb 2x10 Pulleys flexion Standing ball rolls at table flex and abduction x20ea Finger ladder flexion 2x5, abduction x5 Theraband row RTB 2x15 Ball rolls at wall 4way 2x15ea Bent over row 5lb 2x10 Bent over extension 3lb 2x10 Bicep curls 3# 3x10 HEP update and review  12/5 Standing bent over row, horiz abd, Y- tactile cues for scapular motion MANUAL STM & IASTM periscap region, upper trap, levator Sidelying scapular mobilizations  12/1 PROM  all planes Posterior shoulder rolls x10 Seated scap retraction 2x10 Supine cane flexion  2x10 Supine active flexion 2x10 S/l abduction 2x10 S/l ER 2x10 Seated wand flexion 2x5 Pulleys flexion Theraband row RTB 2x10     11/29 Manual: STM to UT  PROM into flexion, ER, and abduction to tolerance and within protocol with distraction  Grade 1 and 2 inf/post GHJ glides  There-ex:  Pulleys 1 min  Wand press 1lb 3x10  Wand flexion 1lb 3x10   Neuro re-ed:  Scap retraction 3x10 yellow  Shoulder extension 3x10 yellow   11/25 Manual: STM to UT  PROM into flexion, ER, and abduction to tolerance and within protocol with distraction  Grade 1 and 2 inf/post GHJ glides   There-ex:  Isometrics  IR iso 2x10 3-5 sec hold  Flexion iso 2x10 3-5 sechold  ER iso 2x10 3-5 sec hold   Reviewed scap retraction  Reviewed HEP   11/20  Manual: STM to UT  PROM into flexion, ER, and abduction to tolerance and within protocol with distraction  Grade I inf/post GHJ glides   There-ex: Self Elbow flexion 2x10  Scap retraction 3x10  Table slidein flexion 2x10  Pulley x15 fwd and x15 scaption   08/14/24  STM to UT, lat, periscap PROM all planes (gentle) Pendulums multi plane with cuing for technique (reviewed and corrected technique) Posterior shoulder rolls x10 Seated scap retraction 2x10 UT stretching 3x30sec Supine cane flexion 2x10 Pulleys each flexion and scaption Seated table slides abduction 2x10 HEP update and review   PATIENT EDUCATION: Education details: Educated pt on anatomy and physiology of current symptoms, DASH, diagnosis, prognosis, HEP,  and POC Person educated: Patient Education method: Programmer, Multimedia, Demonstration, Verbal cues, and Handouts Education comprehension: verbalized understanding and returned demonstration  HOME EXERCISE PROGRAM: Access Code: [email or web address] URL: [email or web address] Date: 07/18/2024 Prepared by: Rojean Batten  Exercises - Seated Scapular Retraction  - 1 x daily - 7 x weekly - 3 sets - 10 reps - Seated Cervical Sidebending Stretch  - 1 x daily - 7 x weekly - 3 sets - 10 reps - Seated Levator Scapulae Stretch  - 1 x daily - 7 x weekly - 3 sets - 10 reps - Seated Elbow Flexion and Extension AROM  - 1 x daily - 7 x weekly - 3 sets - 10 reps  ASSESSMENT:  CLINICAL IMPRESSION:  Pt will be 8 weeks s/p tomorrow. She had excellent tolerance to gentle progressions into active movement and strengthening. Fatigued with ball rolls at wall, but denied pain. Measured ROM today with significant improvements in each plane. Updated HEP to reflect progressions. Will continue to progress as tolerated. Pt scheduling assessment with [facility] soon.   Eval:Patient referred to PT for  s/p R RTC repair. Changed dressings, reviewed precautions, demonstrated and reviewed HEP with pt. She is doing well, pain is managed by rest, ice and meds. She is using her sling and having her husband help her with ADL's. No active bleeding or signs of infection. Pt tolerated PROM fairly with pain as expected.Patient will benefit from skilled PT to address below impairments, limitations and improve overall function.  OBJECTIVE IMPAIRMENTS: decreased activity tolerance, decreased shoulder mobility, decreased ROM, decreased strength, impaired flexibility, impaired UE use, postural dysfunction, and pain.  ACTIVITY LIMITATIONS: reaching, lifting, carry,  cleaning, driving, and or occupation  PERSONAL FACTORS:  also affecting patient's functional outcome.  REHAB POTENTIAL: Good  CLINICAL DECISION MAKING: Stable/uncomplicated  EVALUATION COMPLEXITY: Low    GOALS: Short term PT Goals Target date: 08/01/24 Pt will be I and compliant with HEP.  Baseline:  Goal status: MET Pt will decrease pain by 25% overall Baseline: Goal status: MET  Long term PT goals Target date: 10/10/24 Pt will improve Rt shoulder AROM to [facility] to improve  functional reaching Baseline: Goal status: New Pt will improve  Rt shoulder strength to at least 4+/5 MMT to improve functional strength Baseline: Goal status: New Pt will improve Quick Dash to at least 60% functional to show improved function Baseline: Goal status: New Pt will reduce pain to overall less than 3/10 with usual activity and work activity. Baseline: Goal status: New  PLAN: PT FREQUENCY: 1-2  PT DURATION: 8-12 weeks  PLANNED INTERVENTIONS (unless contraindicated): aquatic PT, Canalith repositioning, cryotherapy, Electrical stimulation, Iontophoresis with 4 mg/ml dexamethasome, Moist heat, traction, Ultrasound, gait training, Therapeutic exercise, balance training, neuromuscular re-education, patient/family education, prosthetic training, manual techniques, passive ROM, dry needling, taping, vasopnuematic device, vestibular, spinal manipulations, joint manipulations  PLAN FOR NEXT SESSION: Follow protocol.     Asberry BRAVO Jesusita Jocelyn, PTA 09/06/2024, 11:59 AM

## 2024-09-08 ENCOUNTER — Encounter (HOSPITAL_BASED_OUTPATIENT_CLINIC_OR_DEPARTMENT_OTHER): Payer: Self-pay

## 2024-09-08 ENCOUNTER — Ambulatory Visit (HOSPITAL_BASED_OUTPATIENT_CLINIC_OR_DEPARTMENT_OTHER)

## 2024-09-08 DIAGNOSIS — M25511 Pain in right shoulder: Secondary | ICD-10-CM

## 2024-09-08 DIAGNOSIS — M25611 Stiffness of right shoulder, not elsewhere classified: Secondary | ICD-10-CM

## 2024-09-08 DIAGNOSIS — M6281 Muscle weakness (generalized): Secondary | ICD-10-CM

## 2024-09-08 DIAGNOSIS — G8929 Other chronic pain: Secondary | ICD-10-CM

## 2024-09-08 NOTE — Therapy (Signed)
 OUTPATIENT PHYSICAL THERAPY SHOULDER TREATMENT   Patient Name: Hailey [name] MRN: [id number] DOB:[birth year], 63 y.o., female Today's Date: 09/08/2024  END OF SESSION:  PT End of Session - 09/08/24 0859     Visit Number 12    Number of Visits 24    Date for Recertification  10/10/24    Authorization Type [employer]OY[employer]    Progress Note Due on Visit 20    PT Start Time 0857   pt arrived late   PT Stop Time 0930    PT Time Calculation (min) 33 min    Activity Tolerance Patient tolerated treatment well    Behavior During Therapy [facility] for tasks assessed/performed                  Past Medical History:  Diagnosis Date   Allergy 1982   Anemia    Arthritis 2018   B12 deficiency    CARPAL TUNNEL SYNDROME, BILATERAL 08/04/2010   GERD 03/06/2008   HYPERTENSION 02/10/2007   Obesity    OBESITY NOS 02/10/2007   Sinusitis    Swelling    Vitamin D  deficiency    Past Surgical History:  Procedure Laterality Date   CARPAL TUNNEL RELEASE     Both hands   COLONOSCOPY     2012 at [facility] SURGERY     HERNIA REPAIR  2006   JOINT REPLACEMENT  09/2018   NASAL SINUS SURGERY     TOTAL KNEE ARTHROPLASTY Left 10/16/2019   Procedure: LEFT TOTAL KNEE ARTHROPLASTY;  Surgeon: Jerri Kay HERO, MD;  Location: [facility] OR;  Service: Orthopedics;  Laterality: Left;   Patient Active Problem List   Diagnosis Date Noted   BMI 40.0-44.9, adult (HCC) 05/24/2023   Acute sinusitis 02/23/2023   Postnasal drip 02/23/2023   Osteopenia 11/24/2021   Thoracic radiculopathy 10/23/2021   Thoracic myofascial strain 10/23/2021   Viral syndrome 03/27/2021   Primary osteoarthritis of right knee 09/10/2020   At risk for activity intolerance 09/03/2020   At risk for complication associated with hypotension 07/18/2020   Vitamin D  deficiency 05/30/2020   B12 nutritional deficiency 05/30/2020   Anemia 05/30/2020   Status post total left knee replacement 10/16/2019   Primary osteoarthritis of  left knee 09/06/2019   Screening for diabetes mellitus 02/24/2019   Chronic sinusitis 10/04/2018   CARPAL TUNNEL SYNDROME, BILATERAL 08/04/2010   Viral upper respiratory tract infection 10/16/2008   GERD 03/06/2008   Class 2 obesity due to excess calories without serious comorbidity with body mass index (BMI) of 38.0 to 38.9 in adult 02/10/2007   Essential hypertension 02/10/2007    PCP: Berneta Elsie Sayre, MD  REFERRING PROVIDER: Genelle Standing, MD  REFERRING DIAG: Traumatic complete tear of right rotator cuff, initial encounter [S46.011A]   THERAPY DIAG:  Muscle weakness (generalized)  Acute pain of right shoulder  Chronic right shoulder pain  Impaired range of motion of right shoulder  Rationale for Evaluation and Treatment: Rehabilitation  ONSET DATE: 07/13/24  SUBJECTIVE:  SUBJECTIVE STATEMENT: Pt reports some muscle soreness with HEP progressions, but no shoulder pain. RTW 12/17. Hand dominance: Left  PERTINENT HISTORY: Allergy, Anemia, B12 deficiency, Carpal tunnel syndrome, GERD, HTN, Obesity  PAIN:  Are you having pain? Yes: NPRS scale: 0/10 Pain location: R shoulder Pain description: Achy pain  Aggravating factors: Moving it Relieving factors: Rest, ice, meds  PRECAUTIONS: Shoulder  RED FLAGS: None   WEIGHT BEARING RESTRICTIONS: No  FALLS:  Has patient fallen in last 6 months? 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   NEXT MD VISIT:   OBJECTIVE:  Note: Objective measures were completed at Evaluation unless otherwise noted.  DIAGNOSTIC FINDINGS:  1. Complete full-thickness, full width  tear of the supraspinatus tendon with 3.3 cm of retraction. 2. Moderate infraspinatus tendinosis. 3. Complete subscapularis tear with 3.7 cm of retraction. 4. Medial dislocation of the proximal long head of the biceps tendon with moderate tendinosis. 5. Mild osteoarthritis of the glenohumeral joint.  PATIENT SURVEYS:  Quick Dash: 100  COGNITION: Overall cognitive status: Within functional limits for tasks assessed     SENSATION: WFL  POSTURE: Rounded   UPPER EXTREMITY ROM:   Active/Passive ROM Right 12/5 Right 12/10  Shoulder flexion 145 171 supine AAROM  Shoulder extension    Shoulder abduction 125 175 Supine (AAROM)  Shoulder adduction    Shoulder internal rotation Thumb to midline sacrum 60deg  Shoulder external rotation  90deg  (Blank rows = not tested)  UPPER EXTREMITY MMT:  MMT Right eval Left eval  Shoulder flexion    Shoulder extension    Shoulder abduction    Shoulder adduction    Shoulder internal rotation    Shoulder external rotation    Middle trapezius    Lower trapezius    (Blank rows = not tested)  JOINT MOBILITY TESTING:  Not able to assess with movement today due to surgery.   PALPATION:  Tenderness surrounding sutures and under armpit at lat insertion.                                                                                                                              TREATMENT DATE:   12/12 Pulleys flexion Ball rolls at wall 4way 2x15ea (flex and abd) Bicep curls 3# x30 Standing press out 2x10 0# Standing weight shifts at wall 2x10 Theraband row GTB 2x15 Theraband IR YTB 2x10 Theraband ER YTB 2x10 Standing flexion 1# 2x10 Standing abduction 0# x10, 1# x3 (too fatigued) IR behind back with strap 10sec x5 Standing chicken wing 2x10 Standing lateral raise 1# x5, 0# x5, x10   12/10 PROM all planes Supine active flexion 2x10 S/l abduction 2x10 S/l ER 2x10 Supine wand flexion 2lb 2x10 Pulleys  flexion Standing ball rolls at table flex and abduction x20ea Finger  ladder flexion 2x5, abduction x5 Theraband row RTB 2x15 Ball rolls at wall 4way 2x15ea Bent over row 5lb 2x10 Bent over extension 3lb 2x10 Bicep curls 3# 3x10 HEP update and review  12/5 Standing bent over row, horiz abd, Y- tactile cues for scapular motion MANUAL STM & IASTM periscap region, upper trap, levator Sidelying scapular mobilizations  12/1 PROM all planes Posterior shoulder rolls x10 Seated scap retraction 2x10 Supine cane flexion 2x10 Supine active flexion 2x10 S/l abduction 2x10 S/l ER 2x10 Seated wand flexion 2x5 Pulleys flexion Theraband row RTB 2x10     11/29 Manual: STM to UT  PROM into flexion, ER, and abduction to tolerance and within protocol with distraction  Grade 1 and 2 inf/post GHJ glides  There-ex:  Pulleys 1 min  Wand press 1lb 3x10  Wand flexion 1lb 3x10   Neuro re-ed:  Scap retraction 3x10 yellow  Shoulder extension 3x10 yellow     PATIENT EDUCATION: Education details: Educated pt on anatomy and physiology of current symptoms, DASH, diagnosis, prognosis, HEP,  and POC Person educated: Patient Education method: Programmer, Multimedia, Demonstration, Verbal cues, and Handouts Education comprehension: verbalized understanding and returned demonstration  HOME EXERCISE PROGRAM: Access Code: [email or web address] URL: [email or web address] Date: 07/18/2024 Prepared by: Rojean Batten  Exercises - Seated Scapular Retraction  - 1 x daily - 7 x weekly - 3 sets - 10 reps - Seated Cervical Sidebending Stretch  - 1 x daily - 7 x weekly - 3 sets - 10 reps - Seated Levator Scapulae Stretch  - 1 x daily - 7 x weekly - 3 sets - 10 reps - Seated Elbow Flexion and Extension AROM  - 1 x daily - 7 x weekly - 3 sets - 10 reps  ASSESSMENT:  CLINICAL IMPRESSION:  Excellent tolerance for progressions to strengthening. She fatigues quickly in shoulder musculature, but denies any pain  with exercises. Trialed resisted IR and ER with YTB which pt denied pain with. Good performance with [facility] exercises with intermittent cuing required for proper form and technique. Tx time was limited due to late arrival. Pt returns to work next week and has Psychologist, educational. Discussed pt's restrictions with fitness specialist. Will continue to progress as tolerated.    Eval:Patient referred to PT for  s/p R RTC repair. Changed dressings, reviewed precautions, demonstrated and reviewed HEP with pt. She is doing well, pain is managed by rest, ice and meds. She is using her sling and having her husband help her with ADL's. No active bleeding or signs of infection. Pt tolerated PROM fairly with pain as expected.Patient will benefit from skilled PT to address below impairments, limitations and improve overall function.  OBJECTIVE IMPAIRMENTS: decreased activity tolerance, decreased shoulder mobility, decreased ROM, decreased strength, impaired flexibility, impaired UE use, postural dysfunction, and pain.  ACTIVITY LIMITATIONS: reaching, lifting, carry,  cleaning, driving, and or occupation  PERSONAL FACTORS:  also affecting patient's functional outcome.  REHAB POTENTIAL: Good  CLINICAL DECISION MAKING: Stable/uncomplicated  EVALUATION COMPLEXITY: Low    GOALS: Short term PT Goals Target date: 08/01/24 Pt will be I and compliant with HEP. Baseline:  Goal status: MET Pt will decrease pain by 25% overall Baseline: Goal status: MET  Long term PT goals Target date: 10/10/24 Pt will improve Rt shoulder AROM to [facility] to improve functional reaching Baseline: Goal status: New Pt will improve  Rt shoulder strength to at least 4+/5 MMT to improve functional strength Baseline: Goal status: New Pt will improve Quick Dash to at least  60% functional to show improved function Baseline: Goal status: New Pt will reduce pain to overall less than 3/10 with usual activity and work  activity. Baseline: Goal status: New  PLAN: PT FREQUENCY: 1-2  PT DURATION: 8-12 weeks  PLANNED INTERVENTIONS (unless contraindicated): aquatic PT, Canalith repositioning, cryotherapy, Electrical stimulation, Iontophoresis with 4 mg/ml dexamethasome, Moist heat, traction, Ultrasound, gait training, Therapeutic exercise, balance training, neuromuscular re-education, patient/family education, prosthetic training, manual techniques, passive ROM, dry needling, taping, vasopnuematic device, vestibular, spinal manipulations, joint manipulations  PLAN FOR NEXT SESSION: Follow protocol.     Asberry BRAVO Nyko Gell, PTA 09/08/2024, 10:10 AM

## 2024-09-12 ENCOUNTER — Other Ambulatory Visit: Payer: Self-pay | Admitting: Family Medicine

## 2024-09-12 ENCOUNTER — Telehealth (HOSPITAL_BASED_OUTPATIENT_CLINIC_OR_DEPARTMENT_OTHER): Payer: Self-pay | Admitting: Orthopaedic Surgery

## 2024-09-12 DIAGNOSIS — I10 Essential (primary) hypertension: Secondary | ICD-10-CM

## 2024-09-12 NOTE — Telephone Encounter (Signed)
 Patient wants to know if paper work was filled out for [employer]  that was left on yours desk on Thursday?

## 2024-09-13 ENCOUNTER — Other Ambulatory Visit: Payer: Self-pay | Admitting: Family Medicine

## 2024-09-13 ENCOUNTER — Other Ambulatory Visit: Payer: Self-pay

## 2024-09-13 DIAGNOSIS — I10 Essential (primary) hypertension: Secondary | ICD-10-CM

## 2024-09-13 MED ORDER — BENAZEPRIL HCL 20 MG PO TABS
20.0000 mg | ORAL_TABLET | Freq: Every day | ORAL | 2 refills | Status: AC
  Filled 2024-09-13: qty 90, 90d supply, fill #0

## 2024-09-15 ENCOUNTER — Encounter (HOSPITAL_BASED_OUTPATIENT_CLINIC_OR_DEPARTMENT_OTHER): Payer: Self-pay | Admitting: Physical Therapy

## 2024-09-15 ENCOUNTER — Ambulatory Visit (HOSPITAL_BASED_OUTPATIENT_CLINIC_OR_DEPARTMENT_OTHER): Admitting: Physical Therapy

## 2024-09-15 DIAGNOSIS — M6281 Muscle weakness (generalized): Secondary | ICD-10-CM | POA: Diagnosis not present

## 2024-09-15 DIAGNOSIS — G8929 Other chronic pain: Secondary | ICD-10-CM

## 2024-09-15 DIAGNOSIS — M25511 Pain in right shoulder: Secondary | ICD-10-CM

## 2024-09-15 NOTE — Therapy (Signed)
 " OUTPATIENT PHYSICAL THERAPY SHOULDER TREATMENT   Patient Name: Hailey [name] MRN: [id number] DOB:Mar 22, 1961, 63 y.o., female Today's Date: 09/15/2024  END OF SESSION:  PT End of Session - 09/15/24 1517     Visit Number 13    Number of Visits 24    Date for Recertification  10/10/24    Authorization Type [place] EMPLOYEE    PT Start Time 1015    PT Stop Time 1057    PT Time Calculation (min) 42 min    Activity Tolerance Patient tolerated treatment well    Behavior During Therapy WFL for tasks assessed/performed                   Past Medical History:  Diagnosis Date   Allergy 1982   Anemia    Arthritis 2018   B12 deficiency    CARPAL TUNNEL SYNDROME, BILATERAL 08/04/2010   GERD 03/06/2008   HYPERTENSION 02/10/2007   Obesity    OBESITY NOS 02/10/2007   Sinusitis    Swelling    Vitamin D  deficiency    Past Surgical History:  Procedure Laterality Date   CARPAL TUNNEL RELEASE     Both hands   COLONOSCOPY     2012 at [facility] SURGERY     HERNIA REPAIR  2006   JOINT REPLACEMENT  09/2018   NASAL SINUS SURGERY     TOTAL KNEE ARTHROPLASTY Left 10/16/2019   Procedure: LEFT TOTAL KNEE ARTHROPLASTY;  Surgeon: Jerri Kay HERO, MD;  Location: [facility] OR;  Service: Orthopedics;  Laterality: Left;   Patient Active Problem List   Diagnosis Date Noted   BMI 40.0-44.9, adult (HCC) 05/24/2023   Acute sinusitis 02/23/2023   Postnasal drip 02/23/2023   Osteopenia 11/24/2021   Thoracic radiculopathy 10/23/2021   Thoracic myofascial strain 10/23/2021   Viral syndrome 03/27/2021   Primary osteoarthritis of right knee 09/10/2020   At risk for activity intolerance 09/03/2020   At risk for complication associated with hypotension 07/18/2020   Vitamin D  deficiency 05/30/2020   B12 nutritional deficiency 05/30/2020   Anemia 05/30/2020   Status post total left knee replacement 10/16/2019   Primary osteoarthritis of left knee 09/06/2019   Screening for diabetes  mellitus 02/24/2019   Chronic sinusitis 10/04/2018   CARPAL TUNNEL SYNDROME, BILATERAL 08/04/2010   Viral upper respiratory tract infection 10/16/2008   GERD 03/06/2008   Class 2 obesity due to excess calories without serious comorbidity with body mass index (BMI) of 38.0 to 38.9 in adult 02/10/2007   Essential hypertension 02/10/2007    PCP: Berneta Elsie Sayre, MD  REFERRING PROVIDER: Genelle Standing, MD  REFERRING DIAG: Traumatic complete tear of right rotator cuff, initial encounter [S46.011A]   THERAPY DIAG:  Muscle weakness (generalized)  Acute pain of right shoulder  Chronic right shoulder pain  Rationale for Evaluation and Treatment: Rehabilitation  ONSET DATE: 07/13/24  SUBJECTIVE:  SUBJECTIVE STATEMENT: The patient has returned to work for 2 days She reports just minor soreness. Overall she is doing well.    Hand dominance: Left  PERTINENT HISTORY: Allergy, Anemia, B12 deficiency, Carpal tunnel syndrome, GERD, HTN, Obesity  PAIN:  Are you having pain? Yes: NPRS scale: 0/10 Pain location: R shoulder Pain description: Achy pain  Aggravating factors: Moving it Relieving factors: Rest, ice, meds  PRECAUTIONS: Shoulder  RED FLAGS: None   WEIGHT BEARING RESTRICTIONS: No  FALLS:  Has patient fallen in last 6 months? 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   NEXT MD VISIT:   OBJECTIVE:  Note: Objective measures were completed at Evaluation unless otherwise noted.  DIAGNOSTIC FINDINGS:  1. Complete full-thickness, full width tear of the supraspinatus tendon with 3.3 cm of retraction. 2. Moderate  infraspinatus tendinosis. 3. Complete subscapularis tear with 3.7 cm of retraction. 4. Medial dislocation of the proximal long head of the biceps tendon with moderate tendinosis. 5. Mild osteoarthritis of the glenohumeral joint.  PATIENT SURVEYS:  Quick Dash: 100  COGNITION: Overall cognitive status: Within functional limits for tasks assessed     SENSATION: WFL  POSTURE: Rounded   UPPER EXTREMITY ROM:   Active/Passive ROM Right 12/5 Right 12/10  Shoulder flexion 145 171 supine AAROM  Shoulder extension    Shoulder abduction 125 175 Supine (AAROM)  Shoulder adduction    Shoulder internal rotation Thumb to midline sacrum 60deg  Shoulder external rotation  90deg  (Blank rows = not tested)  UPPER EXTREMITY MMT:  MMT Right eval Left eval  Shoulder flexion    Shoulder extension    Shoulder abduction    Shoulder adduction    Shoulder internal rotation    Shoulder external rotation    Middle trapezius    Lower trapezius    (Blank rows = not tested)  JOINT MOBILITY TESTING:  Not able to assess with movement today due to surgery.   PALPATION:  Tenderness surrounding sutures and under armpit at lat insertion.                                                                                                                              TREATMENT DATE:  12/19 Manual: trigger point release to upper trap and biceps ER and flexion with distraction   There-ex:  Wand flexion 2x10 2 lbs   Neuro-re-ed: Supine ABC 2x 2 lbs  Reviewed set up and use of gym equipment  Cybex row 20 lbs 3x12 RPE of 3  LF lat ull down 20 lbs     12/12 Pulleys flexion Ball rolls at wall 4way 2x15ea (flex and abd) Bicep curls 3# x30 Standing press out 2x10 0# Standing weight shifts at wall 2x10 Theraband row GTB 2x15 Theraband IR YTB 2x10 Theraband ER  YTB 2x10 Standing flexion 1# 2x10 Standing abduction 0# x10, 1# x3 (too fatigued) IR behind back with strap 10sec x5 Standing  chicken wing 2x10 Standing lateral raise 1# x5, 0# x5, x10   12/10 PROM all planes Supine active flexion 2x10 S/l abduction 2x10 S/l ER 2x10 Supine wand flexion 2lb 2x10 Pulleys flexion Standing ball rolls at table flex and abduction x20ea Finger ladder flexion 2x5, abduction x5 Theraband row RTB 2x15 Ball rolls at wall 4way 2x15ea Bent over row 5lb 2x10 Bent over extension 3lb 2x10 Bicep curls 3# 3x10 HEP update and review  12/5 Standing bent over row, horiz abd, Y- tactile cues for scapular motion MANUAL STM & IASTM periscap region, upper trap, levator Sidelying scapular mobilizations  12/1 PROM all planes Posterior shoulder rolls x10 Seated scap retraction 2x10 Supine cane flexion 2x10 Supine active flexion 2x10 S/l abduction 2x10 S/l ER 2x10 Seated wand flexion 2x5 Pulleys flexion Theraband row RTB 2x10     11/29 Manual: STM to UT  PROM into flexion, ER, and abduction to tolerance and within protocol with distraction  Grade 1 and 2 inf/post GHJ glides  There-ex:  Pulleys 1 min  Wand press 1lb 3x10  Wand flexion 1lb 3x10   Neuro re-ed:  Scap retraction 3x10 yellow  Shoulder extension 3x10 yellow     PATIENT EDUCATION: Education details: Educated pt on anatomy and physiology of current symptoms, DASH, diagnosis, prognosis, HEP,  and POC Person educated: Patient Education method: Programmer, Multimedia, Demonstration, Verbal cues, and Handouts Education comprehension: verbalized understanding and returned demonstration  HOME EXERCISE PROGRAM: Access Code: [email or web address] URL: [email or web address] Date: 07/18/2024 Prepared by: Rojean Batten  Exercises - Seated Scapular Retraction  - 1 x daily - 7 x weekly - 3 sets - 10 reps - Seated Cervical Sidebending Stretch  - 1 x daily - 7 x weekly - 3 sets - 10 reps - Seated Levator Scapulae Stretch  - 1 x daily - 7 x weekly - 3 sets - 10 reps - Seated Elbow Flexion and Extension AROM  - 1 x daily -  7 x weekly - 3 sets - 10 reps  ASSESSMENT:  CLINICAL IMPRESSION:  The patient has been going to the gym. We reviewed scapular exercises she can do instead of the bands. We kept her RPE lower then what she feels with the bands. She tolerated well. We reviewed how to progress as she goes. Her ROM was full today. She is progressing very well. We will continue to progress as tolerated.   Eval:Patient referred to PT for  s/p R RTC repair. Changed dressings, reviewed precautions, demonstrated and reviewed HEP with pt. She is doing well, pain is managed by rest, ice and meds. She is using her sling and having her husband help her with ADL's. No active bleeding or signs of infection. Pt tolerated PROM fairly with pain as expected.Patient will benefit from skilled PT to address below impairments, limitations and improve overall function.  OBJECTIVE IMPAIRMENTS: decreased activity tolerance, decreased shoulder mobility, decreased ROM, decreased strength, impaired flexibility, impaired UE use, postural dysfunction, and pain.  ACTIVITY LIMITATIONS: reaching, lifting, carry,  cleaning, driving, and or occupation  PERSONAL FACTORS:  also affecting patient's functional outcome.  REHAB POTENTIAL: Good  CLINICAL DECISION MAKING: Stable/uncomplicated  EVALUATION COMPLEXITY: Low    GOALS: Short term PT Goals Target date: 08/01/24 Pt will be I and compliant with HEP. Baseline:  Goal status: MET Pt will decrease pain by 25% overall Baseline: Goal status: MET  Long term PT goals Target date: 10/10/24 Pt will improve Rt shoulder AROM to [facility] to improve functional reaching Baseline: Goal status: New Pt will improve  Rt shoulder strength to at least 4+/5 MMT to improve functional strength Baseline: Goal status: New Pt will improve Quick Dash to at least 60% functional to show improved function Baseline: Goal status: New Pt will reduce pain to overall less than 3/10 with usual activity and work  activity. Baseline: Goal status: New  PLAN: PT FREQUENCY: 1-2  PT DURATION: 8-12 weeks  PLANNED INTERVENTIONS (unless contraindicated): aquatic PT, Canalith repositioning, cryotherapy, Electrical stimulation, Iontophoresis with 4 mg/ml dexamethasome, Moist heat, traction, Ultrasound, gait training, Therapeutic exercise, balance training, neuromuscular re-education, patient/family education, prosthetic training, manual techniques, passive ROM, dry needling, taping, vasopnuematic device, vestibular, spinal manipulations, joint manipulations  PLAN FOR NEXT SESSION: Follow protocol.     Alm JINNY Don, PT 09/15/2024, 3:23 PM  "

## 2024-09-16 ENCOUNTER — Encounter (HOSPITAL_BASED_OUTPATIENT_CLINIC_OR_DEPARTMENT_OTHER): Admitting: Physical Therapy

## 2024-09-18 ENCOUNTER — Encounter (HOSPITAL_BASED_OUTPATIENT_CLINIC_OR_DEPARTMENT_OTHER): Payer: Self-pay | Admitting: Physical Therapy

## 2024-09-18 ENCOUNTER — Ambulatory Visit (HOSPITAL_BASED_OUTPATIENT_CLINIC_OR_DEPARTMENT_OTHER): Admitting: Physical Therapy

## 2024-09-18 ENCOUNTER — Encounter (HOSPITAL_BASED_OUTPATIENT_CLINIC_OR_DEPARTMENT_OTHER): Admitting: Physical Therapy

## 2024-09-18 DIAGNOSIS — M6281 Muscle weakness (generalized): Secondary | ICD-10-CM

## 2024-09-18 DIAGNOSIS — M25611 Stiffness of right shoulder, not elsewhere classified: Secondary | ICD-10-CM

## 2024-09-18 DIAGNOSIS — G8929 Other chronic pain: Secondary | ICD-10-CM

## 2024-09-18 DIAGNOSIS — M25511 Pain in right shoulder: Secondary | ICD-10-CM

## 2024-09-18 NOTE — Therapy (Signed)
 " OUTPATIENT PHYSICAL THERAPY SHOULDER TREATMENT   Patient Name: Hailey [name] MRN: [id number] DOB:Nov 29, 1960, 63 y.o., female Today's Date: 09/18/2024  END OF SESSION:  PT End of Session - 09/18/24 0951     Visit Number 14    Number of Visits 24    Date for Recertification  10/10/24    Authorization Type [employer]    Progress Note Due on Visit 20    PT Start Time 0937    PT Stop Time 1015    PT Time Calculation (min) 38 min    Activity Tolerance Patient tolerated treatment well    Behavior During Therapy [facility] for tasks assessed/performed                    Past Medical History:  Diagnosis Date   Allergy 1982   Anemia    Arthritis 2018   B12 deficiency    CARPAL TUNNEL SYNDROME, BILATERAL 08/04/2010   GERD 03/06/2008   HYPERTENSION 02/10/2007   Obesity    OBESITY NOS 02/10/2007   Sinusitis    Swelling    Vitamin D  deficiency    Past Surgical History:  Procedure Laterality Date   CARPAL TUNNEL RELEASE     Both hands   COLONOSCOPY     2012 at [facility] SURGERY     HERNIA REPAIR  2006   JOINT REPLACEMENT  09/2018   NASAL SINUS SURGERY     TOTAL KNEE ARTHROPLASTY Left 10/16/2019   Procedure: LEFT TOTAL KNEE ARTHROPLASTY;  Surgeon: Jerri Kay HERO, MD;  Location: [facility] OR;  Service: Orthopedics;  Laterality: Left;   Patient Active Problem List   Diagnosis Date Noted   BMI 40.0-44.9, adult (HCC) 05/24/2023   Acute sinusitis 02/23/2023   Postnasal drip 02/23/2023   Osteopenia 11/24/2021   Thoracic radiculopathy 10/23/2021   Thoracic myofascial strain 10/23/2021   Viral syndrome 03/27/2021   Primary osteoarthritis of right knee 09/10/2020   At risk for activity intolerance 09/03/2020   At risk for complication associated with hypotension 07/18/2020   Vitamin D  deficiency 05/30/2020   B12 nutritional deficiency 05/30/2020   Anemia 05/30/2020   Status post total left knee replacement 10/16/2019   Primary osteoarthritis of left knee  09/06/2019   Screening for diabetes mellitus 02/24/2019   Chronic sinusitis 10/04/2018   CARPAL TUNNEL SYNDROME, BILATERAL 08/04/2010   Viral upper respiratory tract infection 10/16/2008   GERD 03/06/2008   Class 2 obesity due to excess calories without serious comorbidity with body mass index (BMI) of 38.0 to 38.9 in adult 02/10/2007   Essential hypertension 02/10/2007    PCP: Berneta Elsie Sayre, MD  REFERRING PROVIDER: Genelle Standing, MD  REFERRING DIAG: Traumatic complete tear of right rotator cuff, initial encounter [S46.011A]   THERAPY DIAG:  Muscle weakness (generalized)  Acute pain of right shoulder  Chronic right shoulder pain  Impaired range of motion of right shoulder  Rationale for Evaluation and Treatment: Rehabilitation  ONSET DATE: 07/13/24  SUBJECTIVE:  SUBJECTIVE STATEMENT: Pt states that's that she is doing well. She worked 4 days and has no issues. I felt a little muscle fatigue after my last session.    Hand dominance: Left  PERTINENT HISTORY: Allergy, Anemia, B12 deficiency, Carpal tunnel syndrome, GERD, HTN, Obesity  PAIN:  Are you having pain? Yes: NPRS scale: 0/10 Pain location: R shoulder Pain description: Achy pain  Aggravating factors: Moving it Relieving factors: Rest, ice, meds  PRECAUTIONS: Shoulder  RED FLAGS: None   WEIGHT BEARING RESTRICTIONS: No  FALLS:  Has patient fallen in last 6 months? 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   NEXT MD VISIT:   OBJECTIVE:  Note: Objective measures were completed at Evaluation unless otherwise noted.  DIAGNOSTIC FINDINGS:   1. Complete full-thickness, full width tear of the supraspinatus tendon with 3.3 cm of retraction. 2. Moderate infraspinatus tendinosis. 3. Complete subscapularis tear with 3.7 cm of retraction. 4. Medial dislocation of the proximal long head of the biceps tendon with moderate tendinosis. 5. Mild osteoarthritis of the glenohumeral joint.  PATIENT SURVEYS:  Quick Dash: 100  COGNITION: Overall cognitive status: Within functional limits for tasks assessed     SENSATION: WFL  POSTURE: Rounded   UPPER EXTREMITY ROM:   Active/Passive ROM Right 12/5 Right 12/10  Shoulder flexion 145 171 supine AAROM  Shoulder extension    Shoulder abduction 125 175 Supine (AAROM)  Shoulder adduction    Shoulder internal rotation Thumb to midline sacrum 60deg  Shoulder external rotation  90deg  (Blank rows = not tested)  UPPER EXTREMITY MMT:  MMT Right eval Left eval  Shoulder flexion    Shoulder extension    Shoulder abduction    Shoulder adduction    Shoulder internal rotation    Shoulder external rotation    Middle trapezius    Lower trapezius    (Blank rows = not tested)  JOINT MOBILITY TESTING:  Not able to assess with movement today due to surgery.   PALPATION:  Tenderness surrounding sutures and under armpit at lat insertion.                                                                                                                              TREATMENT DATE:  09/18/24 Manual: Assessment of shoulder ROM and capsule.  There-ex:  Wand flexion 2x10 2 lbs  Supine 2lb push ups Theraband row GTB 2x15 Theraband Lat pull GTB 2x15 Standing abduction 0# x10 Wall push up's x10  Pulleys flexion/ abduction  IR behind back with strap 10sec x5 OH press with 1lb weight x15  Standing flexion 1# 2x10   12/19 Manual: trigger point release to upper trap and biceps ER and flexion with distraction   There-ex:  Wand flexion 2x10 2 lbs   Neuro-re-ed: Supine  ABC 2x 2  lbs  Reviewed set up and use of gym equipment  Cybex row 20 lbs 3x12 RPE of 3  LF lat ull down 20 lbs     12/12 Pulleys flexion Ball rolls at wall 4way 2x15ea (flex and abd) Bicep curls 3# x30 Standing press out 2x10 0# Standing weight shifts at wall 2x10 Theraband row GTB 2x15 Theraband IR YTB 2x10 Theraband ER YTB 2x10 Standing flexion 1# 2x10 Standing abduction 0# x10, 1# x3 (too fatigued) IR behind back with strap 10sec x5 Standing chicken wing 2x10 Standing lateral raise 1# x5, 0# x5, x10   12/10 PROM all planes Supine active flexion 2x10 S/l abduction 2x10 S/l ER 2x10 Supine wand flexion 2lb 2x10 Pulleys flexion Standing ball rolls at table flex and abduction x20ea Finger ladder flexion 2x5, abduction x5 Theraband row RTB 2x15 Ball rolls at wall 4way 2x15ea Bent over row 5lb 2x10 Bent over extension 3lb 2x10 Bicep curls 3# 3x10 HEP update and review  12/5 Standing bent over row, horiz abd, Y- tactile cues for scapular motion MANUAL STM & IASTM periscap region, upper trap, levator Sidelying scapular mobilizations  12/1 PROM all planes Posterior shoulder rolls x10 Seated scap retraction 2x10 Supine cane flexion 2x10 Supine active flexion 2x10 S/l abduction 2x10 S/l ER 2x10 Seated wand flexion 2x5 Pulleys flexion Theraband row RTB 2x10     11/29 Manual: STM to UT  PROM into flexion, ER, and abduction to tolerance and within protocol with distraction  Grade 1 and 2 inf/post GHJ glides  There-ex:  Pulleys 1 min  Wand press 1lb 3x10  Wand flexion 1lb 3x10   Neuro re-ed:  Scap retraction 3x10 yellow  Shoulder extension 3x10 yellow     PATIENT EDUCATION: Education details: Educated pt on anatomy and physiology of current symptoms, DASH, diagnosis, prognosis, HEP,  and POC Person educated: Patient Education method: Programmer, Multimedia, Demonstration, Verbal cues, and Handouts Education comprehension: verbalized understanding and  returned demonstration  HOME EXERCISE PROGRAM: Access Code: [email or web address] URL: [email or web address] Date: 07/18/2024 Prepared by: Rojean Batten  Exercises - Seated Scapular Retraction  - 1 x daily - 7 x weekly - 3 sets - 10 reps - Seated Cervical Sidebending Stretch  - 1 x daily - 7 x weekly - 3 sets - 10 reps - Seated Levator Scapulae Stretch  - 1 x daily - 7 x weekly - 3 sets - 10 reps - Seated Elbow Flexion and Extension AROM  - 1 x daily - 7 x weekly - 3 sets - 10 reps  ASSESSMENT:  CLINICAL IMPRESSION:  Pt requested to not perform machines today due to fatigue from last session. She tolerated session well, but she is constantly aware of precautions and is worried about re-injury. Good performance with NMC exercises with intermittent cuing required for proper form and technique. Tx time was limited due to late arrival. Will continue to progress as tolerated.   Eval:Patient referred to PT for  s/p R RTC repair. Changed dressings, reviewed precautions, demonstrated and reviewed HEP with pt. She is doing well, pain is managed by rest, ice and meds. She is using her sling and having her husband help her with ADL's. No active bleeding or signs of infection. Pt tolerated PROM fairly with pain as expected.Patient will benefit from skilled PT to address below impairments, limitations and improve overall function.  OBJECTIVE IMPAIRMENTS: decreased activity tolerance, decreased shoulder mobility, decreased ROM, decreased strength, impaired flexibility, impaired UE use, postural dysfunction, and pain.  ACTIVITY LIMITATIONS: reaching, lifting, carry,  cleaning, driving, and or occupation  PERSONAL FACTORS:  also affecting patient's functional outcome.  REHAB POTENTIAL: Good  CLINICAL DECISION MAKING: Stable/uncomplicated  EVALUATION COMPLEXITY: Low    GOALS: Short term PT Goals Target date: 08/01/24 Pt will be I and compliant with HEP. Baseline:  Goal status: MET Pt will  decrease pain by 25% overall Baseline: Goal status: MET  Long term PT goals Target date: 10/10/24 Pt will improve Rt shoulder AROM to [facility] to improve functional reaching Baseline: Goal status: New Pt will improve  Rt shoulder strength to at least 4+/5 MMT to improve functional strength Baseline: Goal status: New Pt will improve Quick Dash to at least 60% functional to show improved function Baseline: Goal status: New Pt will reduce pain to overall less than 3/10 with usual activity and work activity. Baseline: Goal status: New  PLAN: PT FREQUENCY: 1-2  PT DURATION: 8-12 weeks  PLANNED INTERVENTIONS (unless contraindicated): aquatic PT, Canalith repositioning, cryotherapy, Electrical stimulation, Iontophoresis with 4 mg/ml dexamethasome, Moist heat, traction, Ultrasound, gait training, Therapeutic exercise, balance training, neuromuscular re-education, patient/family education, prosthetic training, manual techniques, passive ROM, dry needling, taping, vasopnuematic device, vestibular, spinal manipulations, joint manipulations  PLAN FOR NEXT SESSION: Follow protocol.     Rojean JONELLE Batten, PT 09/18/2024, 10:17 AM  "

## 2024-09-18 NOTE — Progress Notes (Signed)
 Pt arrived late.

## 2024-09-19 NOTE — Therapy (Signed)
 " OUTPATIENT PHYSICAL THERAPY SHOULDER TREATMENT   Patient Name: Hailey [name] MRN: [id number] DOB:Apr 09, 1961, 63 y.o., female Today's Date: 09/23/2024  END OF SESSION:  PT End of Session - 09/23/24 0839     Visit Number 15    Number of Visits 24    Date for Recertification  10/10/24    Authorization Type [employer] [employer]    Progress Note Due on Visit 20    PT Start Time 0831    PT Stop Time 0913    PT Time Calculation (min) 42 min    Activity Tolerance Patient tolerated treatment well    Behavior During Therapy [facility] for tasks assessed/performed                     Past Medical History:  Diagnosis Date   Allergy 1982   Anemia    Arthritis 2018   B12 deficiency    CARPAL TUNNEL SYNDROME, BILATERAL 08/04/2010   GERD 03/06/2008   HYPERTENSION 02/10/2007   Obesity    OBESITY NOS 02/10/2007   Sinusitis    Swelling    Vitamin D  deficiency    Past Surgical History:  Procedure Laterality Date   CARPAL TUNNEL RELEASE     Both hands   COLONOSCOPY     2012 at [facility] SURGERY     HERNIA REPAIR  2006   JOINT REPLACEMENT  09/2018   NASAL SINUS SURGERY     TOTAL KNEE ARTHROPLASTY Left 10/16/2019   Procedure: LEFT TOTAL KNEE ARTHROPLASTY;  Surgeon: Jerri Kay HERO, MD;  Location: [facility] OR;  Service: Orthopedics;  Laterality: Left;   Patient Active Problem List   Diagnosis Date Noted   BMI 40.0-44.9, adult (HCC) 05/24/2023   Acute sinusitis 02/23/2023   Postnasal drip 02/23/2023   Osteopenia 11/24/2021   Thoracic radiculopathy 10/23/2021   Thoracic myofascial strain 10/23/2021   Viral syndrome 03/27/2021   Primary osteoarthritis of right knee 09/10/2020   At risk for activity intolerance 09/03/2020   At risk for complication associated with hypotension 07/18/2020   Vitamin D  deficiency 05/30/2020   B12 nutritional deficiency 05/30/2020   Anemia 05/30/2020   Status post total left knee replacement 10/16/2019   Primary osteoarthritis of left knee  09/06/2019   Screening for diabetes mellitus 02/24/2019   Chronic sinusitis 10/04/2018   CARPAL TUNNEL SYNDROME, BILATERAL 08/04/2010   Viral upper respiratory tract infection 10/16/2008   GERD 03/06/2008   Class 2 obesity due to excess calories without serious comorbidity with body mass index (BMI) of 38.0 to 38.9 in adult 02/10/2007   Essential hypertension 02/10/2007    PCP: Berneta Elsie Sayre, MD  REFERRING PROVIDER: Genelle Standing, MD  REFERRING DIAG: Traumatic complete tear of right rotator cuff, initial encounter [S46.011A]   THERAPY DIAG:  Muscle weakness (generalized)  Acute pain of right shoulder  Chronic right shoulder pain  Impaired range of motion of right shoulder  Rationale for Evaluation and Treatment: Rehabilitation  ONSET DATE: 07/13/24  SUBJECTIVE:  SUBJECTIVE STATEMENT: Work is going well. I am very cautious about how I perform things and how I am handling things with my arms at work.    Hand dominance: Left  PERTINENT HISTORY: Allergy, Anemia, B12 deficiency, Carpal tunnel syndrome, GERD, HTN, Obesity  PAIN:  Are you having pain? Yes: NPRS scale: 0/10 Pain location: R shoulder Pain description: Achy pain  Aggravating factors: Moving it Relieving factors: Rest, ice, meds  PRECAUTIONS: Shoulder  RED FLAGS: None   WEIGHT BEARING RESTRICTIONS: No  FALLS:  Has patient fallen in last 6 months? 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   NEXT MD VISIT:   OBJECTIVE:  Note: Objective measures were completed at Evaluation unless otherwise noted.  DIAGNOSTIC FINDINGS:  1. Complete  full-thickness, full width tear of the supraspinatus tendon with 3.3 cm of retraction. 2. Moderate infraspinatus tendinosis. 3. Complete subscapularis tear with 3.7 cm of retraction. 4. Medial dislocation of the proximal long head of the biceps tendon with moderate tendinosis. 5. Mild osteoarthritis of the glenohumeral joint.  PATIENT SURVEYS:  Quick Dash: 100  COGNITION: Overall cognitive status: Within functional limits for tasks assessed     SENSATION: WFL  POSTURE: Rounded   UPPER EXTREMITY ROM:   Active/Passive ROM Right 12/5 Right 12/10  Shoulder flexion 145 171 supine AAROM  Shoulder extension    Shoulder abduction 125 175 Supine (AAROM)  Shoulder adduction    Shoulder internal rotation Thumb to midline sacrum 60deg  Shoulder external rotation  90deg  (Blank rows = not tested)  UPPER EXTREMITY MMT:  MMT Right eval Left eval  Shoulder flexion    Shoulder extension    Shoulder abduction    Shoulder adduction    Shoulder internal rotation    Shoulder external rotation    Middle trapezius    Lower trapezius    (Blank rows = not tested)  JOINT MOBILITY TESTING:  Not able to assess with movement today due to surgery.   PALPATION:  Tenderness surrounding sutures and under armpit at lat insertion.                                                                                                                              TREATMENT DATE:  09/18/24 There-ex:  UBE 2 fwd/ 2 bkwd Theraband row GTB 2x15 Standing abduction 0# x10 Wall push up's x10  Farmers carry with 5lb kettle bell Wall circles CW/CCW to fatigue  PNF D1/ D2 LF lat ull down 20 lbs  Demonstrated ER and PNF on cable pull with 5lbs OH push up with 2lb weight    12/19 Manual: trigger point release to upper trap and biceps ER and flexion with distraction   There-ex:  Wand flexion 2x10 2 lbs   Neuro-re-ed: Supine ABC 2x 2 lbs  Reviewed set up and use of gym equipment  Cybex row 20  lbs 3x12 RPE of 3  LF lat ull down 20 lbs     12/12 Pulleys flexion Ball rolls at wall 4way 2x15ea (flex and abd) Bicep curls 3# x30 Standing press out 2x10 0# Standing weight shifts at wall 2x10 Theraband row GTB 2x15 Theraband IR YTB 2x10 Theraband ER YTB 2x10 Standing flexion 1# 2x10 Standing abduction 0# x10, 1# x3 (too fatigued) IR behind back with strap 10sec x5 Standing chicken wing 2x10 Standing lateral raise 1# x5, 0# x5, x10   12/10 PROM all planes Supine active flexion 2x10 S/l abduction 2x10 S/l ER 2x10 Supine wand flexion 2lb 2x10 Pulleys flexion Standing ball rolls at table flex and abduction x20ea Finger ladder flexion 2x5, abduction x5 Theraband row RTB 2x15 Ball rolls at wall 4way 2x15ea Bent over row 5lb 2x10 Bent over extension 3lb 2x10 Bicep curls 3# 3x10 HEP update and review  12/5 Standing bent over row, horiz abd, Y- tactile cues for scapular motion MANUAL STM & IASTM periscap region, upper trap, levator Sidelying scapular mobilizations   PATIENT EDUCATION: Education details: Educated pt on anatomy and physiology of current symptoms, DASH, diagnosis, prognosis, HEP,  and POC Person educated: Patient Education method: Programmer, Multimedia, Demonstration, Verbal cues, and Handouts Education comprehension: verbalized understanding and returned demonstration  HOME EXERCISE PROGRAM: Access Code: [email or web address] URL: [email or web address] Date: 07/18/2024 Prepared by: Rojean Batten  Exercises - Seated Scapular Retraction  - 1 x daily - 7 x weekly - 3 sets - 10 reps - Seated Cervical Sidebending Stretch  - 1 x daily - 7 x weekly - 3 sets - 10 reps - Seated Levator Scapulae Stretch  - 1 x daily - 7 x weekly - 3 sets - 10 reps - Seated Elbow Flexion and Extension AROM  - 1 x daily - 7 x weekly - 3 sets - 10 reps  ASSESSMENT:  CLINICAL IMPRESSION:  She tolerated session well, she continues to be cautious and is worried about  re-injury. Good performance with NMC exercises with intermittent cuing required for proper form and technique. Demonstrated gym equipment again with encouragement to start slow with low weights and resistance bands and free weights if weight is too high on machines. Will continue to progress as tolerated.   Eval:Patient referred to PT for  s/p R RTC repair. Changed dressings, reviewed precautions, demonstrated and reviewed HEP with pt. She is doing well, pain is managed by rest, ice and meds. She is using her sling and having her husband help her with ADL's. No active bleeding or signs of infection. Pt tolerated PROM fairly with pain as expected.Patient will benefit from skilled PT to address below impairments, limitations and improve overall function.  OBJECTIVE IMPAIRMENTS: decreased activity tolerance, decreased shoulder mobility, decreased ROM, decreased strength, impaired flexibility, impaired UE use, postural dysfunction, and pain.  ACTIVITY LIMITATIONS: reaching, lifting, carry,  cleaning, driving, and or occupation  PERSONAL FACTORS:  also affecting patient's functional outcome.  REHAB POTENTIAL: Good  CLINICAL DECISION MAKING: Stable/uncomplicated  EVALUATION COMPLEXITY: Low    GOALS: Short term PT Goals Target date: 08/01/24 Pt will be I and compliant with HEP. Baseline:  Goal status: MET Pt will decrease pain by 25% overall Baseline: Goal status: MET  Long term PT goals Target date: 10/10/24 Pt will improve Rt shoulder AROM to [facility] to improve functional reaching Baseline: Goal status: New Pt will improve  Rt shoulder strength to at least 4+/5 MMT  to improve functional strength Baseline: Goal status: New Pt will improve Quick Dash to at least 60% functional to show improved function Baseline: Goal status: New Pt will reduce pain to overall less than 3/10 with usual activity and work activity. Baseline: Goal status: New  PLAN: PT FREQUENCY: 1-2  PT DURATION: 8-12  weeks  PLANNED INTERVENTIONS (unless contraindicated): aquatic PT, Canalith repositioning, cryotherapy, Electrical stimulation, Iontophoresis with 4 mg/ml dexamethasome, Moist heat, traction, Ultrasound, gait training, Therapeutic exercise, balance training, neuromuscular re-education, patient/family education, prosthetic training, manual techniques, passive ROM, dry needling, taping, vasopnuematic device, vestibular, spinal manipulations, joint manipulations  PLAN FOR NEXT SESSION: Follow protocol.     Rojean JONELLE Batten, PT 09/23/2024, 9:17 AM  "

## 2024-09-23 ENCOUNTER — Ambulatory Visit (HOSPITAL_BASED_OUTPATIENT_CLINIC_OR_DEPARTMENT_OTHER): Admitting: Physical Therapy

## 2024-09-23 ENCOUNTER — Encounter (HOSPITAL_BASED_OUTPATIENT_CLINIC_OR_DEPARTMENT_OTHER): Payer: Self-pay | Admitting: Physical Therapy

## 2024-09-23 DIAGNOSIS — M25611 Stiffness of right shoulder, not elsewhere classified: Secondary | ICD-10-CM

## 2024-09-23 DIAGNOSIS — G8929 Other chronic pain: Secondary | ICD-10-CM

## 2024-09-23 DIAGNOSIS — M6281 Muscle weakness (generalized): Secondary | ICD-10-CM | POA: Diagnosis not present

## 2024-09-23 DIAGNOSIS — M25511 Pain in right shoulder: Secondary | ICD-10-CM

## 2024-09-25 ENCOUNTER — Institutional Professional Consult (permissible substitution) (INDEPENDENT_AMBULATORY_CARE_PROVIDER_SITE_OTHER)

## 2024-09-27 ENCOUNTER — Other Ambulatory Visit (HOSPITAL_BASED_OUTPATIENT_CLINIC_OR_DEPARTMENT_OTHER): Payer: Self-pay

## 2024-09-27 ENCOUNTER — Ambulatory Visit (INDEPENDENT_AMBULATORY_CARE_PROVIDER_SITE_OTHER)

## 2024-09-27 ENCOUNTER — Encounter (INDEPENDENT_AMBULATORY_CARE_PROVIDER_SITE_OTHER): Payer: Self-pay

## 2024-09-27 VITALS — BP 122/78 | HR 75 | Ht 64.0 in | Wt 257.0 lb

## 2024-09-27 DIAGNOSIS — J301 Allergic rhinitis due to pollen: Secondary | ICD-10-CM

## 2024-09-27 DIAGNOSIS — R519 Headache, unspecified: Secondary | ICD-10-CM

## 2024-09-27 DIAGNOSIS — J328 Other chronic sinusitis: Secondary | ICD-10-CM

## 2024-09-27 MED ORDER — AZELASTINE HCL 0.1 % NA SOLN
2.0000 | Freq: Two times a day (BID) | NASAL | 12 refills | Status: AC
  Filled 2024-09-27: qty 30, 50d supply, fill #0

## 2024-09-27 NOTE — Progress Notes (Unsigned)
 Dear Dr. Waddell, Here is my assessment for our mutual patient, Hailey [name]. Thank you for allowing me the opportunity to care for your patient. Please do not hesitate to contact me should you have any other questions. Sincerely, Dr. Hadassah Parody  Otolaryngology Clinic Note Referring provider: Dr. Waddell HPI:   Initial HPI (09/27/2024) Hailey [name] is a 63 y.o. female kindly referred by Dr. Waddell for evaluation of ***.   H&N Surgery: *** Personal or FHx of bleeding dz or anesthesia difficulty: no ***  GLP-1: *** AP/AC: ***  Tobacco: *** Alcohol: ***.  Independent Review of Additional Tests or Records:  08/17/2024 referral Corean Waddell, PA-C: 63 year old with history of sinusitis presenting for headache, waking up every day with a sinus headache has occasional nosebleeds.  Referral to ENT and a Medrol  Dosepak  ENT note 05/15/2021 [name], MD: Seen for evaluation of her sinuses.  Had problems in the spring and fall at that time as she works in the garden.  Takes Zyrtec and Mucinex .  Occasionally has sinus infections treated with amoxicillin  and Z-Pak.  Previous surgeries with Dr. Floy x 3.  Does not like using nasal steroids.  At that time he recommended regular use of saline rinses when she is having problems as well as use Sinex as needed.  No clinical evidence of sinus infection at that time.   PMH/Meds/All/SocHx/FamHx/ROS:   Past Medical History:  Diagnosis Date   Allergy 1982   Anemia    Arthritis 2018   B12 deficiency    CARPAL TUNNEL SYNDROME, BILATERAL 08/04/2010   GERD 03/06/2008   HYPERTENSION 02/10/2007   Obesity    OBESITY NOS 02/10/2007   Sinusitis    Swelling    Vitamin D  deficiency      Past Surgical History:  Procedure Laterality Date   CARPAL TUNNEL RELEASE     Both hands   COLONOSCOPY     2012 at [facility] SUR[facility]ERY     HERNIA REPAIR  2006   JOINT REPLACEMENT  09/2018   NASAL SINUS SURGERY     TOTAL KNEE ARTHROPLASTY  Left 10/16/2019   Procedure: LEFT TOTAL KNEE ARTHROPLASTY;  Surgeon: Jerri Kay HERO, MD;  Location: [facility] OR;  Service: Orthopedics;  Laterality: Left;    Family History  Problem Relation Age of Onset   Liver disease Mother    Hypertension Father    Arthritis Father    Colon cancer Neg Hx    Esophageal cancer Neg Hx    Rectal cancer Neg Hx    Stomach cancer Neg Hx      Social Connections: Socially Integrated (06/09/2024)   Social Connection and Isolation Panel    Frequency of Communication with Friends and Family: More than three times a week    Frequency of Social Gatherings with Friends and Family: Once a week    Attends Religious Services: More than 4 times per year    Active Member of [employer] or Organizations: Yes    Attends Engineer, Structural: More than 4 times per year    Marital Status: Married     Current Outpatient Medications  Medication Instructions   aspirin  EC 325 mg, Oral, Daily   benazepril  (LOTENSIN ) 20 mg, Oral, Daily   calcium carbonate (OSCAL) 1500 (600 Ca) MG TABS tablet 2 times daily with meals   cetirizine-pseudoephedrine  (ZYRTEC-D) 5-120 MG tablet 1 tablet, Daily PRN   cholecalciferol (VITAMIN D3) 1,000 Units, Daily   FERROUS SULFATE PO 1 tablet, Daily  methocarbamol  (ROBAXIN ) 500 mg, Oral, Every 8 hours PRN   methocarbamol  (ROBAXIN ) 500 mg, Oral, 2 times daily   methylPREDNISolone  (MEDROL  DOSEPAK) 4 MG TBPK tablet Take as directed on foil pack   Multiple Vitamin (MULTIVITAMIN WITH MINERALS) TABS tablet 1 tablet, Daily     Physical Exam:   LMP 03/14/2014 Comment: perimenopause  Salient findings:  CN II-XII intact ***with the exception of CNVIII as above***  *** Bilateral EAC clear and TM intact with well pneumatized middle ear spaces Weber 512: *** Rinne 512: AC > BC b/l *** Rine 1024: AC > BC b/l ***  Anterior rhinoscopy: Septum ***; bilateral inferior turbinates with ***  No lesions of oral cavity/oropharynx; dentition ***  No  obviously palpable neck masses/lymphadenopathy/thyromegaly  No respiratory distress or stridor***  Seprately Identifiable Procedures:  Prior to initiating any procedures, risks/benefits/alternatives were explained to the patient and verbal consent obtained. None***  Impression & Plans:  Hailey [name] is a 63 y.o. female with ***  No diagnosis found.   See below regarding exact medications prescribed this encounter including dosages and route: No orders of the defined types were placed in this encounter.     Thank you for allowing me the opportunity to care for your patient. Please do not hesitate to contact me should you have any other questions.  Sincerely, Hadassah Parody, MD Otolaryngologist (ENT), [facility] Phone: [phone] Fax: [phone]  MDM:  Level *** Complexity/Problems addressed: ***- *** Data complexity: ***-  independent review of *** - Morbidity: ***- ***  - Prescription Drug prescribed or managed: ***

## 2024-09-30 ENCOUNTER — Encounter (HOSPITAL_BASED_OUTPATIENT_CLINIC_OR_DEPARTMENT_OTHER): Admitting: Physical Therapy

## 2024-09-30 NOTE — Therapy (Unsigned)
 " OUTPATIENT PHYSICAL THERAPY SHOULDER TREATMENT   Patient Name: Hailey [name] MRN: [id number] DOB:1961-03-10, 64 y.o., female Today's Date: 10/02/2024  END OF SESSION:  PT End of Session - 10/02/24 1034     Visit Number 16    Number of Visits 24    Date for Recertification  10/10/24    Authorization Type [employer]    Progress Note Due on Visit 20    PT Start Time 1015    PT Stop Time 1055    PT Time Calculation (min) 40 min    Activity Tolerance Patient tolerated treatment well    Behavior During Therapy [facility] for tasks assessed/performed                      Past Medical History:  Diagnosis Date   Allergy 1982   Anemia    Arthritis 2018   B12 deficiency    CARPAL TUNNEL SYNDROME, BILATERAL 08/04/2010   GERD 03/06/2008   HYPERTENSION 02/10/2007   Obesity    OBESITY NOS 02/10/2007   Sinusitis    Swelling    Vitamin D  deficiency    Past Surgical History:  Procedure Laterality Date   CARPAL TUNNEL RELEASE     Both hands   COLONOSCOPY     2012 at [facility] SURGERY     HERNIA REPAIR  2006   JOINT REPLACEMENT  09/2018   NASAL SINUS SURGERY     TOTAL KNEE ARTHROPLASTY Left 10/16/2019   Procedure: LEFT TOTAL KNEE ARTHROPLASTY;  Surgeon: Jerri Kay HERO, MD;  Location: [facility] OR;  Service: Orthopedics;  Laterality: Left;   Patient Active Problem List   Diagnosis Date Noted   BMI 40.0-44.9, adult (HCC) 05/24/2023   Acute sinusitis 02/23/2023   Postnasal drip 02/23/2023   Osteopenia 11/24/2021   Thoracic radiculopathy 10/23/2021   Thoracic myofascial strain 10/23/2021   Viral syndrome 03/27/2021   Primary osteoarthritis of right knee 09/10/2020   At risk for activity intolerance 09/03/2020   At risk for complication associated with hypotension 07/18/2020   Vitamin D  deficiency 05/30/2020   B12 nutritional deficiency 05/30/2020   Anemia 05/30/2020   Status post total left knee replacement 10/16/2019   Primary osteoarthritis of left knee  09/06/2019   Screening for diabetes mellitus 02/24/2019   Chronic sinusitis 10/04/2018   CARPAL TUNNEL SYNDROME, BILATERAL 08/04/2010   Viral upper respiratory tract infection 10/16/2008   GERD 03/06/2008   Class 2 obesity due to excess calories without serious comorbidity with body mass index (BMI) of 38.0 to 38.9 in adult 02/10/2007   Essential hypertension 02/10/2007    PCP: Berneta Elsie Sayre, MD  REFERRING PROVIDER: Genelle Standing, MD  REFERRING DIAG: Traumatic complete tear of right rotator cuff, initial encounter [S46.011A]   THERAPY DIAG:  Muscle weakness (generalized)  Acute pain of right shoulder  Chronic right shoulder pain  Rationale for Evaluation and Treatment: Rehabilitation  ONSET DATE: 07/13/24  SUBJECTIVE:  SUBJECTIVE STATEMENT: I was tired after working the whole week last week. It was muscle fatigue.    Hand dominance: Left  PERTINENT HISTORY: Allergy, Anemia, B12 deficiency, Carpal tunnel syndrome, GERD, HTN, Obesity  PAIN:  Are you having pain? Yes: NPRS scale: 0/10 Pain location: R shoulder Pain description: Achy pain  Aggravating factors: Moving it Relieving factors: Rest, ice, meds  PRECAUTIONS: Shoulder  RED FLAGS: None   WEIGHT BEARING RESTRICTIONS: No  FALLS:  Has patient fallen in last 6 months? 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   NEXT MD VISIT:   OBJECTIVE:  Note: Objective measures were completed at Evaluation unless otherwise noted.  DIAGNOSTIC FINDINGS:  1. Complete full-thickness, full width tear of the supraspinatus tendon with 3.3 cm of retraction. 2.  Moderate infraspinatus tendinosis. 3. Complete subscapularis tear with 3.7 cm of retraction. 4. Medial dislocation of the proximal long head of the biceps tendon with moderate tendinosis. 5. Mild osteoarthritis of the glenohumeral joint.  PATIENT SURVEYS:  Quick Dash: 100  COGNITION: Overall cognitive status: Within functional limits for tasks assessed     SENSATION: WFL  POSTURE: Rounded   UPPER EXTREMITY ROM:   Active/Passive ROM Right 12/5 Right 12/10  Shoulder flexion 145 171 supine AAROM  Shoulder extension    Shoulder abduction 125 175 Supine (AAROM)  Shoulder adduction    Shoulder internal rotation Thumb to midline sacrum 60deg  Shoulder external rotation  90deg  (Blank rows = not tested)  UPPER EXTREMITY MMT:  MMT Right eval Left eval  Shoulder flexion    Shoulder extension    Shoulder abduction    Shoulder adduction    Shoulder internal rotation    Shoulder external rotation    Middle trapezius    Lower trapezius    (Blank rows = not tested)  JOINT MOBILITY TESTING:  Not able to assess with movement today due to surgery.   PALPATION:  Tenderness surrounding sutures and under armpit at lat insertion.                                                                                                                              TREATMENT DATE:  10/03/24 There-ex:  UBE 2 fwd/ 2 bkwd Theraband row GTB 2x15 Shoulder ER with RTB 2x15  Farmers carry with 10lb kettle bell RTB wall abduction  PNF D1/ D2 with RTB  Standing abduction/ flexion and OH press with 1lb weight in front of mirror.   09/18/24 There-ex:  UBE 2 fwd/ 2 bkwd Theraband row GTB 2x15 Standing abduction 0# x10 Wall push up's x10  Farmers carry with 5lb kettle bell Wall circles CW/CCW to fatigue  PNF D1/ D2 LF lat ull down 20 lbs  Demonstrated ER and PNF on cable pull with 5lbs OH push up  with 2lb weight    12/19 Manual: trigger point release to upper trap and biceps ER and  flexion with distraction   There-ex:  Wand flexion 2x10 2 lbs   Neuro-re-ed: Supine ABC 2x 2 lbs  Reviewed set up and use of gym equipment  Cybex row 20 lbs 3x12 RPE of 3  LF lat ull down 20 lbs     12/12 Pulleys flexion Ball rolls at wall 4way 2x15ea (flex and abd) Bicep curls 3# x30 Standing press out 2x10 0# Standing weight shifts at wall 2x10 Theraband row GTB 2x15 Theraband IR YTB 2x10 Theraband ER YTB 2x10 Standing flexion 1# 2x10 Standing abduction 0# x10, 1# x3 (too fatigued) IR behind back with strap 10sec x5 Standing chicken wing 2x10 Standing lateral raise 1# x5, 0# x5, x10   12/10 PROM all planes Supine active flexion 2x10 S/l abduction 2x10 S/l ER 2x10 Supine wand flexion 2lb 2x10 Pulleys flexion Standing ball rolls at table flex and abduction x20ea Finger ladder flexion 2x5, abduction x5 Theraband row RTB 2x15 Ball rolls at wall 4way 2x15ea Bent over row 5lb 2x10 Bent over extension 3lb 2x10 Bicep curls 3# 3x10 HEP update and review  12/5 Standing bent over row, horiz abd, Y- tactile cues for scapular motion MANUAL STM & IASTM periscap region, upper trap, levator Sidelying scapular mobilizations   PATIENT EDUCATION: Education details: Educated pt on anatomy and physiology of current symptoms, DASH, diagnosis, prognosis, HEP,  and POC Person educated: Patient Education method: Programmer, Multimedia, Demonstration, Verbal cues, and Handouts Education comprehension: verbalized understanding and returned demonstration  HOME EXERCISE PROGRAM: Access Code: [email or web address] URL: [email or web address] Date: 07/18/2024 Prepared by: Rojean Batten  Exercises - Seated Scapular Retraction  - 1 x daily - 7 x weekly - 3 sets - 10 reps - Seated Cervical Sidebending Stretch  - 1 x daily - 7 x weekly - 3 sets - 10 reps - Seated Levator Scapulae Stretch  - 1 x daily - 7 x weekly - 3 sets - 10 reps - Seated Elbow Flexion and Extension AROM  - 1 x daily  - 7 x weekly - 3 sets - 10 reps  ASSESSMENT:  CLINICAL IMPRESSION:  She tolerated session well, she continues to be cautious and is worried about re-injury. Pt is able to perform all exercises required of her with goof form. She is most limited by fatigue. Will continue to progress as tolerated.   Eval:Patient referred to PT for  s/p R RTC repair. Changed dressings, reviewed precautions, demonstrated and reviewed HEP with pt. She is doing well, pain is managed by rest, ice and meds. She is using her sling and having her husband help her with ADL's. No active bleeding or signs of infection. Pt tolerated PROM fairly with pain as expected.Patient will benefit from skilled PT to address below impairments, limitations and improve overall function.  OBJECTIVE IMPAIRMENTS: decreased activity tolerance, decreased shoulder mobility, decreased ROM, decreased strength, impaired flexibility, impaired UE use, postural dysfunction, and pain.  ACTIVITY LIMITATIONS: reaching, lifting, carry,  cleaning, driving, and or occupation  PERSONAL FACTORS:  also affecting patient's functional outcome.  REHAB POTENTIAL: Good  CLINICAL DECISION MAKING: Stable/uncomplicated  EVALUATION COMPLEXITY: Low    GOALS: Short term PT Goals Target date: 08/01/24 Pt will be I and compliant with HEP. Baseline:  Goal status: MET Pt will decrease pain by 25% overall Baseline: Goal status: MET  Long term PT goals Target date: 10/10/24 Pt will improve Rt shoulder AROM to [facility] to  improve functional reaching Baseline: Goal status: New Pt will improve  Rt shoulder strength to at least 4+/5 MMT to improve functional strength Baseline: Goal status: New Pt will improve Quick Dash to at least 60% functional to show improved function Baseline: Goal status: New Pt will reduce pain to overall less than 3/10 with usual activity and work activity. Baseline: Goal status: New  PLAN: PT FREQUENCY: 1-2  PT DURATION: 8-12  weeks  PLANNED INTERVENTIONS (unless contraindicated): aquatic PT, Canalith repositioning, cryotherapy, Electrical stimulation, Iontophoresis with 4 mg/ml dexamethasome, Moist heat, traction, Ultrasound, gait training, Therapeutic exercise, balance training, neuromuscular re-education, patient/family education, prosthetic training, manual techniques, passive ROM, dry needling, taping, vasopnuematic device, vestibular, spinal manipulations, joint manipulations  PLAN FOR NEXT SESSION: Follow protocol.     Rojean JONELLE Batten, PT 10/02/2024, 11:05 AM  "

## 2024-10-02 ENCOUNTER — Ambulatory Visit (HOSPITAL_BASED_OUTPATIENT_CLINIC_OR_DEPARTMENT_OTHER): Attending: Orthopaedic Surgery | Admitting: Physical Therapy

## 2024-10-02 ENCOUNTER — Encounter (HOSPITAL_BASED_OUTPATIENT_CLINIC_OR_DEPARTMENT_OTHER): Payer: Self-pay | Admitting: Physical Therapy

## 2024-10-02 DIAGNOSIS — M25511 Pain in right shoulder: Secondary | ICD-10-CM | POA: Insufficient documentation

## 2024-10-02 DIAGNOSIS — M6281 Muscle weakness (generalized): Secondary | ICD-10-CM | POA: Insufficient documentation

## 2024-10-02 DIAGNOSIS — G8929 Other chronic pain: Secondary | ICD-10-CM | POA: Insufficient documentation

## 2024-10-04 ENCOUNTER — Ambulatory Visit (HOSPITAL_BASED_OUTPATIENT_CLINIC_OR_DEPARTMENT_OTHER): Admitting: Physical Therapy

## 2024-10-04 DIAGNOSIS — G8929 Other chronic pain: Secondary | ICD-10-CM

## 2024-10-04 DIAGNOSIS — M6281 Muscle weakness (generalized): Secondary | ICD-10-CM | POA: Diagnosis not present

## 2024-10-04 DIAGNOSIS — M25511 Pain in right shoulder: Secondary | ICD-10-CM

## 2024-10-04 NOTE — Therapy (Signed)
 " OUTPATIENT PHYSICAL THERAPY SHOULDER TREATMENT   Patient Name: Hailey [name] MRN: [id number] DOB:05-20-1961, 64 y.o., female Today's Date: 10/05/2024  END OF SESSION:  PT End of Session - 10/05/24 1522     Visit Number 17    Number of Visits 24    Date for Recertification  10/10/24    Authorization Type [employer]    PT Start Time 1600    PT Stop Time 1643    PT Time Calculation (min) 43 min    Activity Tolerance Patient tolerated treatment well    Behavior During Therapy WFL for tasks assessed/performed                       Past Medical History:  Diagnosis Date   Allergy 1982   Anemia    Arthritis 2018   B12 deficiency    CARPAL TUNNEL SYNDROME, BILATERAL 08/04/2010   GERD 03/06/2008   HYPERTENSION 02/10/2007   Obesity    OBESITY NOS 02/10/2007   Sinusitis    Swelling    Vitamin D  deficiency    Past Surgical History:  Procedure Laterality Date   CARPAL TUNNEL RELEASE     Both hands   COLONOSCOPY     2012 at [facility] SURGERY     HERNIA REPAIR  2006   JOINT REPLACEMENT  09/2018   NASAL SINUS SURGERY     TOTAL KNEE ARTHROPLASTY Left 10/16/2019   Procedure: LEFT TOTAL KNEE ARTHROPLASTY;  Surgeon: Jerri Kay HERO, MD;  Location: [facility] OR;  Service: Orthopedics;  Laterality: Left;   Patient Active Problem List   Diagnosis Date Noted   BMI 40.0-44.9, adult (HCC) 05/24/2023   Acute sinusitis 02/23/2023   Postnasal drip 02/23/2023   Osteopenia 11/24/2021   Thoracic radiculopathy 10/23/2021   Thoracic myofascial strain 10/23/2021   Viral syndrome 03/27/2021   Primary osteoarthritis of right knee 09/10/2020   At risk for activity intolerance 09/03/2020   At risk for complication associated with hypotension 07/18/2020   Vitamin D  deficiency 05/30/2020   B12 nutritional deficiency 05/30/2020   Anemia 05/30/2020   Status post total left knee replacement 10/16/2019   Primary osteoarthritis of left knee 09/06/2019   Screening for  diabetes mellitus 02/24/2019   Chronic sinusitis 10/04/2018   CARPAL TUNNEL SYNDROME, BILATERAL 08/04/2010   Viral upper respiratory tract infection 10/16/2008   GERD 03/06/2008   Class 2 obesity due to excess calories without serious comorbidity with body mass index (BMI) of 38.0 to 38.9 in adult 02/10/2007   Essential hypertension 02/10/2007    PCP: Berneta Elsie Sayre, MD  REFERRING PROVIDER: Genelle Standing, MD  REFERRING DIAG: Traumatic complete tear of right rotator cuff, initial encounter [S46.011A]   THERAPY DIAG:  Muscle weakness (generalized)  Acute pain of right shoulder  Chronic right shoulder pain  Rationale for Evaluation and Treatment: Rehabilitation  ONSET DATE: 07/13/24  SUBJECTIVE:  SUBJECTIVE STATEMENT: I was tired after working the whole week last week. It was muscle fatigue.    Hand dominance: Left  PERTINENT HISTORY: Allergy, Anemia, B12 deficiency, Carpal tunnel syndrome, GERD, HTN, Obesity  PAIN:  Are you having pain? Yes: NPRS scale: 0/10 Pain location: R shoulder Pain description: Achy pain  Aggravating factors: Moving it Relieving factors: Rest, ice, meds  PRECAUTIONS: Shoulder  RED FLAGS: None   WEIGHT BEARING RESTRICTIONS: No  FALLS:  Has patient fallen in last 6 months? 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   NEXT MD VISIT:   OBJECTIVE:  Note: Objective measures were completed at Evaluation unless otherwise noted.  DIAGNOSTIC FINDINGS:  1. Complete full-thickness, full width tear of the supraspinatus tendon with 3.3 cm of retraction. 2. Moderate infraspinatus  tendinosis. 3. Complete subscapularis tear with 3.7 cm of retraction. 4. Medial dislocation of the proximal long head of the biceps tendon with moderate tendinosis. 5. Mild osteoarthritis of the glenohumeral joint.  PATIENT SURVEYS:  Quick Dash: 100  COGNITION: Overall cognitive status: Within functional limits for tasks assessed     SENSATION: WFL  POSTURE: Rounded   UPPER EXTREMITY ROM:   Active/Passive ROM Right 12/5 Right 12/10  Shoulder flexion 145 171 supine AAROM  Shoulder extension    Shoulder abduction 125 175 Supine (AAROM)  Shoulder adduction    Shoulder internal rotation Thumb to midline sacrum 60deg  Shoulder external rotation  90deg  (Blank rows = not tested)  UPPER EXTREMITY MMT:  MMT Right eval Left eval  Shoulder flexion    Shoulder extension    Shoulder abduction    Shoulder adduction    Shoulder internal rotation    Shoulder external rotation    Middle trapezius    Lower trapezius    (Blank rows = not tested)  JOINT MOBILITY TESTING:  Not able to assess with movement today due to surgery.   PALPATION:  Tenderness surrounding sutures and under armpit at lat insertion.                                                                                                                              TREATMENT DATE:  1/7 There-ex:  UBE 2 min forward 2 back  Wand flexion 3lbs 3x10  Cable row 20 lbs 3x10  Shoulder extension 3x10  Shoulder press 3x10 2lbs  Biceps curl 3x10 2lbs   Manual: PROM into flexion and extension with distraction. Reached endrange quickly   10/03/24 There-ex:  UBE 2 fwd/ 2 bkwd Theraband row GTB 2x15 Shoulder ER with RTB 2x15  Farmers carry with 10lb kettle bell RTB wall abduction  PNF D1/ D2 with RTB  Standing abduction/ flexion and OH press with 1lb weight in front of mirror.   09/18/24 There-ex:  UBE 2 fwd/  2 bkwd Theraband row GTB 2x15 Standing abduction 0# x10 Wall push up's x10  Farmers carry with 5lb  kettle bell Wall circles CW/CCW to fatigue  PNF D1/ D2 LF lat ull down 20 lbs  Demonstrated ER and PNF on cable pull with 5lbs OH push up with 2lb weight    12/19 Manual: trigger point release to upper trap and biceps ER and flexion with distraction   There-ex:  Wand flexion 2x10 2 lbs   Neuro-re-ed: Supine ABC 2x 2 lbs  Reviewed set up and use of gym equipment  Cybex row 20 lbs 3x12 RPE of 3  LF lat ull down 20 lbs     12/12 Pulleys flexion Ball rolls at wall 4way 2x15ea (flex and abd) Bicep curls 3# x30 Standing press out 2x10 0# Standing weight shifts at wall 2x10 Theraband row GTB 2x15 Theraband IR YTB 2x10 Theraband ER YTB 2x10 Standing flexion 1# 2x10 Standing abduction 0# x10, 1# x3 (too fatigued) IR behind back with strap 10sec x5 Standing chicken wing 2x10 Standing lateral raise 1# x5, 0# x5, x10   12/10 PROM all planes Supine active flexion 2x10 S/l abduction 2x10 S/l ER 2x10 Supine wand flexion 2lb 2x10 Pulleys flexion Standing ball rolls at table flex and abduction x20ea Finger ladder flexion 2x5, abduction x5 Theraband row RTB 2x15 Ball rolls at wall 4way 2x15ea Bent over row 5lb 2x10 Bent over extension 3lb 2x10 Bicep curls 3# 3x10 HEP update and review  12/5 Standing bent over row, horiz abd, Y- tactile cues for scapular motion MANUAL STM & IASTM periscap region, upper trap, levator Sidelying scapular mobilizations   PATIENT EDUCATION: Education details: Educated pt on anatomy and physiology of current symptoms, DASH, diagnosis, prognosis, HEP,  and POC Person educated: Patient Education method: Programmer, Multimedia, Demonstration, Verbal cues, and Handouts Education comprehension: verbalized understanding and returned demonstration  HOME EXERCISE PROGRAM: Access Code: [email or web address] URL: [email or web address] Date: 07/18/2024 Prepared by: Rojean Batten  Exercises - Seated Scapular Retraction  - 1 x daily - 7 x weekly -  3 sets - 10 reps - Seated Cervical Sidebending Stretch  - 1 x daily - 7 x weekly - 3 sets - 10 reps - Seated Levator Scapulae Stretch  - 1 x daily - 7 x weekly - 3 sets - 10 reps - Seated Elbow Flexion and Extension AROM  - 1 x daily - 7 x weekly - 3 sets - 10 reps  ASSESSMENT:  CLINICAL IMPRESSION:  Therapy reviewed gym exercises. She had no significant pain. We reviewed how to progress overhead activity. She was advised to keep it light. If she continues to have no pain we may D/C next visit.  Eval:Patient referred to PT for  s/p R RTC repair. Changed dressings, reviewed precautions, demonstrated and reviewed HEP with pt. She is doing well, pain is managed by rest, ice and meds. She is using her sling and having her husband help her with ADL's. No active bleeding or signs of infection. Pt tolerated PROM fairly with pain as expected.Patient will benefit from skilled PT to address below impairments, limitations and improve overall function.  OBJECTIVE IMPAIRMENTS: decreased activity tolerance, decreased shoulder mobility, decreased ROM, decreased strength, impaired flexibility, impaired UE use, postural dysfunction, and pain.  ACTIVITY LIMITATIONS: reaching, lifting, carry,  cleaning, driving, and or occupation  PERSONAL FACTORS:  also affecting patient's functional outcome.  REHAB POTENTIAL: Good  CLINICAL DECISION MAKING: Stable/uncomplicated  EVALUATION COMPLEXITY: Low    GOALS: Short term PT Goals  Target date: 08/01/24 Pt will be I and compliant with HEP. Baseline:  Goal status: MET Pt will decrease pain by 25% overall Baseline: Goal status: MET  Long term PT goals Target date: 10/10/24 Pt will improve Rt shoulder AROM to [facility] to improve functional reaching Baseline: Goal status: New Pt will improve  Rt shoulder strength to at least 4+/5 MMT to improve functional strength Baseline: Goal status: New Pt will improve Quick Dash to at least 60% functional to show improved  function Baseline: Goal status: New Pt will reduce pain to overall less than 3/10 with usual activity and work activity. Baseline: Goal status: New  PLAN: PT FREQUENCY: 1-2  PT DURATION: 8-12 weeks  PLANNED INTERVENTIONS (unless contraindicated): aquatic PT, Canalith repositioning, cryotherapy, Electrical stimulation, Iontophoresis with 4 mg/ml dexamethasome, Moist heat, traction, Ultrasound, gait training, Therapeutic exercise, balance training, neuromuscular re-education, patient/family education, prosthetic training, manual techniques, passive ROM, dry needling, taping, vasopnuematic device, vestibular, spinal manipulations, joint manipulations  PLAN FOR NEXT SESSION: Follow protocol.     Alm JINNY Don, PT 10/05/2024, 3:26 PM  "

## 2024-10-05 ENCOUNTER — Encounter (HOSPITAL_BASED_OUTPATIENT_CLINIC_OR_DEPARTMENT_OTHER): Payer: Self-pay | Admitting: Physical Therapy

## 2024-10-10 ENCOUNTER — Ambulatory Visit (HOSPITAL_BASED_OUTPATIENT_CLINIC_OR_DEPARTMENT_OTHER)

## 2024-10-10 ENCOUNTER — Encounter (HOSPITAL_BASED_OUTPATIENT_CLINIC_OR_DEPARTMENT_OTHER): Payer: Self-pay

## 2024-10-11 ENCOUNTER — Other Ambulatory Visit (HOSPITAL_BASED_OUTPATIENT_CLINIC_OR_DEPARTMENT_OTHER): Payer: Self-pay

## 2024-10-11 MED ORDER — AMOXICILLIN 500 MG PO CAPS
ORAL_CAPSULE | ORAL | 1 refills | Status: AC
  Filled 2024-10-11: qty 8, 1d supply, fill #0

## 2024-10-13 ENCOUNTER — Ambulatory Visit (HOSPITAL_BASED_OUTPATIENT_CLINIC_OR_DEPARTMENT_OTHER): Admitting: Orthopaedic Surgery

## 2024-10-13 DIAGNOSIS — S46011A Strain of muscle(s) and tendon(s) of the rotator cuff of right shoulder, initial encounter: Secondary | ICD-10-CM

## 2024-10-13 NOTE — Progress Notes (Signed)
 "                                Post Operative Evaluation    Procedure/Date of Surgery: Right shoulder rotator cuff repair biceps tenodesis 10/16  Interval History:   Presents 12 status post above procedure.  Overall she is doing extremely well.  Overhead range of motion is well improved.   PMH/PSH/Family History/Social History/Meds/Allergies:    Past Medical History:  Diagnosis Date   Allergy 1982   Anemia    Arthritis 2018   B12 deficiency    CARPAL TUNNEL SYNDROME, BILATERAL 08/04/2010   GERD 03/06/2008   HYPERTENSION 02/10/2007   Obesity    OBESITY NOS 02/10/2007   Sinusitis    Swelling    Vitamin D  deficiency    Past Surgical History:  Procedure Laterality Date   CARPAL TUNNEL RELEASE     Both hands   COLONOSCOPY     2012 at [facility] SURGERY     HERNIA REPAIR  2006   JOINT REPLACEMENT  09/2018   NASAL SINUS SURGERY     TOTAL KNEE ARTHROPLASTY Left 10/16/2019   Procedure: LEFT TOTAL KNEE ARTHROPLASTY;  Surgeon: Jerri Kay HERO, MD;  Location: [facility] OR;  Service: Orthopedics;  Laterality: Left;   Social History   Socioeconomic History   Marital status: Married    Spouse name: Not on file   Number of children: Not on file   Years of education: Not on file   Highest education level: Associate degree: occupational, scientist, product/process development, or vocational program  Occupational History   Not on file  Tobacco Use   Smoking status: Never   Smokeless tobacco: Never  Vaping Use   Vaping status: Never Used  Substance and Sexual Activity   Alcohol use: Yes    Comment: rarely   Drug use: No   Sexual activity: Not on file  Other Topics Concern   Not on file  Social History Narrative   Not on file   Social Drivers of Health   Tobacco Use: Low Risk (10/05/2024)   Patient History    Smoking Tobacco Use: Never    Smokeless Tobacco Use: Never    Passive Exposure: Not on file  Financial Resource Strain: Low Risk (06/09/2024)   Overall Financial Resource Strain  (CARDIA)    Difficulty of Paying Living Expenses: Not hard at all  Food Insecurity: No Food Insecurity (06/09/2024)   Epic    Worried About Programme Researcher, Broadcasting/film/video in the Last Year: Never true    Ran Out of Food in the Last Year: Never true  Transportation Needs: No Transportation Needs (06/09/2024)   Epic    Lack of Transportation (Medical): No    Lack of Transportation (Non-Medical): No  Physical Activity: Sufficiently Active (06/09/2024)   Exercise Vital Sign    Days of Exercise per Week: 5 days    Minutes of Exercise per Session: 60 min  Stress: Stress Concern Present (06/09/2024)   [employer] of Occupational Health - Occupational Stress Questionnaire    Feeling of Stress: Very much  Social Connections: Socially Integrated (06/09/2024)   Social Connection and Isolation Panel    Frequency of Communication with Friends and Family: More than three times a week    Frequency of Social Gatherings with Friends and Family: Once a week    Attends Religious Services: More than 4 times per year    Active Member of  Clubs or Organizations: Yes    Attends Banker Meetings: More than 4 times per year    Marital Status: Married  Depression (PHQ2-9): Medium Risk (06/09/2024)   Depression (PHQ2-9)    PHQ-2 Score: 7  Alcohol Screen: Low Risk (06/09/2024)   Alcohol Screen    Last Alcohol Screening Score (AUDIT): 1  Housing: Low Risk (06/09/2024)   Epic    Unable to Pay for Housing in the Last Year: No    Number of Times Moved in the Last Year: 0    Homeless in the Last Year: No  Utilities: Not on file  Health Literacy: Not on file   Family History  Problem Relation Age of Onset   Liver disease Mother    Hypertension Father    Arthritis Father    Colon cancer Neg Hx    Esophageal cancer Neg Hx    Rectal cancer Neg Hx    Stomach cancer Neg Hx    Allergies  Allergen Reactions   Other Other (See Comments)   Sulfamethoxazole Hives and Dermatitis    sulfamethoxazole   Current  Outpatient Medications  Medication Sig Dispense Refill   amoxicillin  (AMOXIL ) 500 MG capsule Take 4 capsules by mouth  1 hour prior to dental appointment. 8 capsule 1   aspirin  EC 325 MG tablet Take 1 tablet (325 mg total) by mouth daily. 14 tablet 0   azelastine  (ASTELIN ) 0.1 % nasal spray Place 2 sprays into both nostrils 2 (two) times daily. Use in each nostril as directed 30 mL 12   benazepril  (LOTENSIN ) 20 MG tablet Take 1 tablet (20 mg total) by mouth daily. 90 tablet 2   calcium carbonate (OSCAL) 1500 (600 Ca) MG TABS tablet Take by mouth 2 (two) times daily with a meal.     cetirizine-pseudoephedrine  (ZYRTEC-D) 5-120 MG tablet Take 1 tablet by mouth daily as needed for allergies.     cholecalciferol (VITAMIN D3) 25 MCG (1000 UNIT) tablet Take 1,000 Units by mouth daily.     FERROUS SULFATE PO Take 1 tablet by mouth daily.      methocarbamol  (ROBAXIN ) 500 MG tablet Take 1 tablet (500 mg total) by mouth every 8 (eight) hours as needed for muscle spasms. 30 tablet 0   methocarbamol  (ROBAXIN ) 500 MG tablet Take 1 tablet (500 mg total) by mouth 2 (two) times daily. 60 tablet 1   methylPREDNISolone  (MEDROL  DOSEPAK) 4 MG TBPK tablet Take as directed on foil pack 21 each 0   Multiple Vitamin (MULTIVITAMIN WITH MINERALS) TABS tablet Take 1 tablet by mouth daily.     No current facility-administered medications for this visit.   No results found.  Review of Systems:   A ROS was performed including pertinent positives and negatives as documented in the HPI.   Musculoskeletal Exam:     Right shoulder incisions are well-appearing without erythema or drainage.  Active forward elevation in the spine position is to 145 degrees with external rotation at side to 30 degrees, internal rotation is to L1.  Imaging:      I personally reviewed and interpreted the radiographs.   Assessment:   12 weeks status post right shoulder rotator cuff repair and biceps tenodesis overall doing very well.  At  this time she is doing extremely well.  I will plan to see her back as needed  Plan :    - Return to clinic as needed      I personally saw and evaluated the patient, and  participated in the management and treatment plan.  Elspeth Parker, MD Attending Physician, Orthopedic Surgery  This document was dictated using Dragon voice recognition software. A reasonable attempt at proof reading has been made to minimize errors. "

## 2024-11-27 ENCOUNTER — Ambulatory Visit: Payer: Self-pay | Admitting: Allergy

## 2025-01-01 ENCOUNTER — Ambulatory Visit (INDEPENDENT_AMBULATORY_CARE_PROVIDER_SITE_OTHER)
